# Patient Record
Sex: Female | Born: 1935 | Race: White | Hispanic: No | Marital: Married | Smoking: Never smoker
Health system: Southern US, Community
[De-identification: ages and names within clinical notes are randomized; demographics above are authoritative.]

## PROBLEM LIST (undated history)

## (undated) DIAGNOSIS — R112 Nausea with vomiting, unspecified: Secondary | ICD-10-CM

## (undated) DIAGNOSIS — T8859XA Other complications of anesthesia, initial encounter: Secondary | ICD-10-CM

## (undated) DIAGNOSIS — I509 Heart failure, unspecified: Secondary | ICD-10-CM

## (undated) DIAGNOSIS — F419 Anxiety disorder, unspecified: Secondary | ICD-10-CM

## (undated) DIAGNOSIS — K2210 Ulcer of esophagus without bleeding: Secondary | ICD-10-CM

## (undated) DIAGNOSIS — I4891 Unspecified atrial fibrillation: Secondary | ICD-10-CM

## (undated) DIAGNOSIS — Z923 Personal history of irradiation: Secondary | ICD-10-CM

## (undated) DIAGNOSIS — J449 Chronic obstructive pulmonary disease, unspecified: Secondary | ICD-10-CM

## (undated) DIAGNOSIS — T4145XA Adverse effect of unspecified anesthetic, initial encounter: Secondary | ICD-10-CM

## (undated) DIAGNOSIS — I10 Essential (primary) hypertension: Secondary | ICD-10-CM

## (undated) DIAGNOSIS — I499 Cardiac arrhythmia, unspecified: Secondary | ICD-10-CM

## (undated) DIAGNOSIS — H269 Unspecified cataract: Secondary | ICD-10-CM

## (undated) DIAGNOSIS — K219 Gastro-esophageal reflux disease without esophagitis: Secondary | ICD-10-CM

## (undated) DIAGNOSIS — C801 Malignant (primary) neoplasm, unspecified: Secondary | ICD-10-CM

## (undated) DIAGNOSIS — C50919 Malignant neoplasm of unspecified site of unspecified female breast: Secondary | ICD-10-CM

## (undated) DIAGNOSIS — Z9889 Other specified postprocedural states: Secondary | ICD-10-CM

## (undated) HISTORY — PX: BREAST SURGERY: SHX581

## (undated) HISTORY — DX: Unspecified atrial fibrillation: I48.91

## (undated) HISTORY — PX: COLONOSCOPY WITH ESOPHAGOGASTRODUODENOSCOPY (EGD): SHX5779

## (undated) HISTORY — DX: Chronic obstructive pulmonary disease, unspecified: J44.9

## (undated) HISTORY — PX: BREAST BIOPSY: SHX20

## (undated) HISTORY — DX: Heart failure, unspecified: I50.9

## (undated) HISTORY — PX: BREAST LUMPECTOMY: SHX2

## (undated) HISTORY — PX: HERNIA REPAIR: SHX51

---
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
## 2005-03-27 ENCOUNTER — Other Ambulatory Visit: Admission: RE | Admit: 2005-03-27 | Discharge: 2005-03-27 | Payer: Self-pay | Admitting: Obstetrics and Gynecology

## 2008-07-06 ENCOUNTER — Ambulatory Visit: Payer: Self-pay | Admitting: Surgery

## 2008-07-15 ENCOUNTER — Ambulatory Visit: Payer: Self-pay | Admitting: Surgery

## 2011-07-18 ENCOUNTER — Inpatient Hospital Stay: Payer: Self-pay | Admitting: Internal Medicine

## 2011-08-01 ENCOUNTER — Ambulatory Visit: Payer: Self-pay | Admitting: Orthopedic Surgery

## 2011-08-13 ENCOUNTER — Other Ambulatory Visit: Payer: Self-pay | Admitting: Neurosurgery

## 2011-08-13 ENCOUNTER — Encounter (HOSPITAL_COMMUNITY): Payer: Self-pay | Admitting: Respiratory Therapy

## 2011-08-19 ENCOUNTER — Encounter (HOSPITAL_COMMUNITY)
Admission: RE | Admit: 2011-08-19 | Discharge: 2011-08-19 | Disposition: A | Discharge status: Home / Self Care | Payer: MEDICARE | Source: Ambulatory Visit | Attending: Neurosurgery | Admitting: Neurosurgery

## 2011-08-19 ENCOUNTER — Encounter (HOSPITAL_COMMUNITY): Payer: Self-pay

## 2011-08-19 ENCOUNTER — Other Ambulatory Visit: Payer: Self-pay

## 2011-08-19 ENCOUNTER — Encounter (HOSPITAL_COMMUNITY)
Admission: RE | Admit: 2011-08-19 | Discharge: 2011-08-19 | Disposition: A | Discharge status: Home / Self Care | Payer: MEDICARE | Source: Ambulatory Visit | Attending: Anesthesiology | Admitting: Anesthesiology

## 2011-08-19 HISTORY — DX: Malignant (primary) neoplasm, unspecified: C80.1

## 2011-08-19 HISTORY — DX: Anxiety disorder, unspecified: F41.9

## 2011-08-19 HISTORY — DX: Essential (primary) hypertension: I10

## 2011-08-19 HISTORY — DX: Ulcer of esophagus without bleeding: K22.10

## 2011-08-19 LAB — BASIC METABOLIC PANEL
CO2: 28 mEq/L (ref 19–32)
Glucose, Bld: 92 mg/dL (ref 70–99)
Potassium: 4.2 mEq/L (ref 3.5–5.1)
Sodium: 138 mEq/L (ref 135–145)

## 2011-08-19 LAB — CBC
Hemoglobin: 12.8 g/dL (ref 12.0–15.0)
MCH: 26.7 pg (ref 26.0–34.0)
MCV: 81.5 fL (ref 78.0–100.0)
RBC: 4.8 MIL/uL (ref 3.87–5.11)
WBC: 7.2 10*3/uL (ref 4.0–10.5)

## 2011-08-19 LAB — SURGICAL PCR SCREEN: MRSA, PCR: POSITIVE — AB

## 2011-08-19 NOTE — Progress Notes (Signed)
Dr Darrold Junker called for any cardiac updates.pt lives in [place].

## 2011-08-19 NOTE — Pre-Procedure Instructions (Signed)
20 Cathy Spears  08/19/2011   Your procedure is scheduled on:  08/22/11  Report to [facility] at 1100 AM.  Call this number if you have problems the morning of surgery: [phone]   Remember:   Do not eat food:After Midnight.  May have clear liquids: up to 4 Hours before arrival.  Clear liquids include soda, tea, black coffee, apple or grape juice, broth.  Take these medicines the morning of surgery with A SIP OF WATER: neurontin,lopressor,protonix   Do not wear jewelry, make-up or nail polish.  Do not wear lotions, powders, or perfumes. You may wear deodorant.  Do not shave 48 hours prior to surgery.  Do not bring valuables to the hospital.  Contacts, dentures or bridgework may not be worn into surgery.  Leave suitcase in the car. After surgery it may be brought to your room.  For patients admitted to the hospital, checkout time is 11:00 AM the day of discharge.   Patients discharged the day of surgery will not be allowed to drive home.  Name and phone number of your driver: family  Special Instructions: CHG Shower Use Special Wash: 1/2 bottle night before surgery and 1/2 bottle morning of surgery.   Please read over the following fact sheets that you were given: Pain Booklet, MRSA Information and Surgical Site Infection Prevention

## 2011-08-21 MED ORDER — CEFAZOLIN SODIUM 1-5 GM-% IV SOLN
1.0000 g | INTRAVENOUS | Status: AC
  Administered 2011-08-22: 1 g via INTRAVENOUS
  Filled 2011-08-21: qty 50

## 2011-08-22 ENCOUNTER — Inpatient Hospital Stay (HOSPITAL_COMMUNITY)
Admission: RE | Admit: 2011-08-22 | Discharge: 2011-08-23 | DRG: 491 | Disposition: A | Discharge status: Home / Self Care | Payer: MEDICARE | Source: Ambulatory Visit | Attending: Neurosurgery | Admitting: Neurosurgery

## 2011-08-22 ENCOUNTER — Encounter (HOSPITAL_COMMUNITY): Payer: Self-pay | Admitting: *Deleted

## 2011-08-22 ENCOUNTER — Other Ambulatory Visit: Payer: Self-pay | Admitting: Neurosurgery

## 2011-08-22 ENCOUNTER — Ambulatory Visit (HOSPITAL_COMMUNITY): Payer: MEDICARE | Admitting: Anesthesiology

## 2011-08-22 ENCOUNTER — Encounter (HOSPITAL_COMMUNITY): Payer: Self-pay | Admitting: Anesthesiology

## 2011-08-22 ENCOUNTER — Ambulatory Visit (HOSPITAL_COMMUNITY): Payer: MEDICARE

## 2011-08-22 ENCOUNTER — Encounter (HOSPITAL_COMMUNITY)
Admission: RE | Disposition: A | Discharge status: Home / Self Care | Payer: Self-pay | Source: Ambulatory Visit | Attending: Neurosurgery

## 2011-08-22 DIAGNOSIS — Z7982 Long term (current) use of aspirin: Secondary | ICD-10-CM

## 2011-08-22 DIAGNOSIS — M7130 Other bursal cyst, unspecified site: Secondary | ICD-10-CM | POA: Diagnosis present

## 2011-08-22 DIAGNOSIS — K219 Gastro-esophageal reflux disease without esophagitis: Secondary | ICD-10-CM | POA: Diagnosis present

## 2011-08-22 DIAGNOSIS — Z853 Personal history of malignant neoplasm of breast: Secondary | ICD-10-CM

## 2011-08-22 DIAGNOSIS — F411 Generalized anxiety disorder: Secondary | ICD-10-CM | POA: Diagnosis present

## 2011-08-22 DIAGNOSIS — M48061 Spinal stenosis, lumbar region without neurogenic claudication: Principal | ICD-10-CM | POA: Diagnosis present

## 2011-08-22 DIAGNOSIS — M4310 Spondylolisthesis, site unspecified: Secondary | ICD-10-CM | POA: Diagnosis present

## 2011-08-22 DIAGNOSIS — M7138 Other bursal cyst, other site: Secondary | ICD-10-CM

## 2011-08-22 DIAGNOSIS — I10 Essential (primary) hypertension: Secondary | ICD-10-CM | POA: Diagnosis present

## 2011-08-22 HISTORY — PX: LUMBAR LAMINECTOMY/DECOMPRESSION MICRODISCECTOMY: SHX5026

## 2011-08-22 SURGERY — LUMBAR LAMINECTOMY/DECOMPRESSION MICRODISCECTOMY
Anesthesia: General | Site: Back | Laterality: Right | Wound class: Clean

## 2011-08-22 MED ORDER — SODIUM CHLORIDE 0.9 % IR SOLN
Status: DC | PRN
  Administered 2011-08-22: 15:00:00

## 2011-08-22 MED ORDER — GABAPENTIN 300 MG PO CAPS
300.0000 mg | ORAL_CAPSULE | Freq: Two times a day (BID) | ORAL | Status: DC
  Filled 2011-08-22 (×3): qty 1

## 2011-08-22 MED ORDER — NEOSTIGMINE METHYLSULFATE 1 MG/ML IJ SOLN
INTRAMUSCULAR | Status: DC | PRN
  Administered 2011-08-22: 4 mg via INTRAVENOUS

## 2011-08-22 MED ORDER — HYDROMORPHONE HCL PF 1 MG/ML IJ SOLN
INTRAMUSCULAR | Status: AC
  Administered 2011-08-22: 0.5 mg via INTRAVENOUS
  Filled 2011-08-22: qty 1

## 2011-08-22 MED ORDER — OXYCODONE-ACETAMINOPHEN 5-325 MG PO TABS
1.0000 | ORAL_TABLET | ORAL | Status: DC | PRN
  Administered 2011-08-23: 2 via ORAL
  Filled 2011-08-22: qty 2

## 2011-08-22 MED ORDER — KETOROLAC TROMETHAMINE 30 MG/ML IJ SOLN: 15.0000 mg | Freq: Once | INTRAMUSCULAR | Status: DC | PRN

## 2011-08-22 MED ORDER — BACITRACIN ZINC 500 UNIT/GM EX OINT
TOPICAL_OINTMENT | CUTANEOUS | Status: DC | PRN
  Administered 2011-08-22: 1 via TOPICAL

## 2011-08-22 MED ORDER — MEPERIDINE HCL 25 MG/ML IJ SOLN: 6.2500 mg | INTRAMUSCULAR | Status: DC | PRN

## 2011-08-22 MED ORDER — ACETAMINOPHEN 650 MG RE SUPP: 650.0000 mg | RECTAL | Status: DC | PRN

## 2011-08-22 MED ORDER — PANTOPRAZOLE SODIUM 40 MG PO TBEC
40.0000 mg | DELAYED_RELEASE_TABLET | Freq: Every day | ORAL | Status: DC
  Administered 2011-08-23: 40 mg via ORAL
  Filled 2011-08-22: qty 1

## 2011-08-22 MED ORDER — VITAMIN D3 25 MCG (1000 UNIT) PO TABS
1000.0000 [IU] | ORAL_TABLET | Freq: Every day | ORAL | Status: DC
  Filled 2011-08-22: qty 1

## 2011-08-22 MED ORDER — CEFAZOLIN SODIUM 1-5 GM-% IV SOLN
1.0000 g | Freq: Three times a day (TID) | INTRAVENOUS | Status: AC
  Administered 2011-08-22 – 2011-08-23 (×2): 1 g via INTRAVENOUS
  Filled 2011-08-22 (×2): qty 50

## 2011-08-22 MED ORDER — MUPIROCIN 2 % EX OINT
TOPICAL_OINTMENT | Freq: Two times a day (BID) | CUTANEOUS | Status: DC
  Administered 2011-08-22: 21:00:00 via NASAL
  Filled 2011-08-22: qty 22

## 2011-08-22 MED ORDER — 0.9 % SODIUM CHLORIDE (POUR BTL) OPTIME
TOPICAL | Status: DC | PRN
  Administered 2011-08-22: 1000 mL

## 2011-08-22 MED ORDER — LACTATED RINGERS IV SOLN
INTRAVENOUS | Status: DC | PRN
  Administered 2011-08-22 (×2): via INTRAVENOUS

## 2011-08-22 MED ORDER — DOCUSATE SODIUM 100 MG PO CAPS
100.0000 mg | ORAL_CAPSULE | Freq: Two times a day (BID) | ORAL | Status: DC
  Administered 2011-08-22: 100 mg via ORAL
  Filled 2011-08-22: qty 1

## 2011-08-22 MED ORDER — HYDROMORPHONE HCL PF 1 MG/ML IJ SOLN
0.2500 mg | INTRAMUSCULAR | Status: DC | PRN
  Administered 2011-08-22 (×2): 0.5 mg via INTRAVENOUS

## 2011-08-22 MED ORDER — MORPHINE SULFATE 4 MG/ML IJ SOLN: 1.0000 mg | INTRAMUSCULAR | Status: DC | PRN

## 2011-08-22 MED ORDER — PROPOFOL 10 MG/ML IV EMUL
INTRAVENOUS | Status: DC | PRN
  Administered 2011-08-22: 150 mg via INTRAVENOUS

## 2011-08-22 MED ORDER — HYDROCODONE-ACETAMINOPHEN 5-325 MG PO TABS: 1.0000 | ORAL_TABLET | ORAL | Status: DC | PRN

## 2011-08-22 MED ORDER — BUPIVACAINE-EPINEPHRINE PF 0.5-1:200000 % IJ SOLN
INTRAMUSCULAR | Status: DC | PRN
  Administered 2011-08-22: 10 mL

## 2011-08-22 MED ORDER — MENTHOL 3 MG MT LOZG: 1.0000 | LOZENGE | OROMUCOSAL | Status: DC | PRN

## 2011-08-22 MED ORDER — PROMETHAZINE HCL 25 MG/ML IJ SOLN: 6.2500 mg | INTRAMUSCULAR | Status: DC | PRN

## 2011-08-22 MED ORDER — ZOLPIDEM TARTRATE 5 MG PO TABS: 5.0000 mg | ORAL_TABLET | Freq: Every evening | ORAL | Status: DC | PRN

## 2011-08-22 MED ORDER — MIDAZOLAM HCL 5 MG/5ML IJ SOLN
INTRAMUSCULAR | Status: DC | PRN
  Administered 2011-08-22: 2 mg via INTRAVENOUS

## 2011-08-22 MED ORDER — ACETAMINOPHEN 325 MG PO TABS: 650.0000 mg | ORAL_TABLET | ORAL | Status: DC | PRN

## 2011-08-22 MED ORDER — GLYCOPYRROLATE 0.2 MG/ML IJ SOLN
INTRAMUSCULAR | Status: DC | PRN
  Administered 2011-08-22: .8 mg via INTRAVENOUS

## 2011-08-22 MED ORDER — BACITRACIN 50000 UNITS IM SOLR
INTRAMUSCULAR | Status: AC
  Filled 2011-08-22: qty 1

## 2011-08-22 MED ORDER — DIAZEPAM 5 MG PO TABS: 5.0000 mg | ORAL_TABLET | Freq: Four times a day (QID) | ORAL | Status: DC | PRN

## 2011-08-22 MED ORDER — HEMOSTATIC AGENTS (NO CHARGE) OPTIME
TOPICAL | Status: DC | PRN
  Administered 2011-08-22: 1 via TOPICAL

## 2011-08-22 MED ORDER — ONDANSETRON HCL 4 MG/2ML IJ SOLN
INTRAMUSCULAR | Status: DC | PRN
  Administered 2011-08-22: 4 mg via INTRAVENOUS

## 2011-08-22 MED ORDER — METOPROLOL TARTRATE 50 MG PO TABS
50.0000 mg | ORAL_TABLET | Freq: Two times a day (BID) | ORAL | Status: DC
  Administered 2011-08-22: 50 mg via ORAL
  Filled 2011-08-22 (×3): qty 1

## 2011-08-22 MED ORDER — ONDANSETRON HCL 4 MG/2ML IJ SOLN
4.0000 mg | INTRAMUSCULAR | Status: DC | PRN
  Administered 2011-08-22: 4 mg via INTRAVENOUS
  Filled 2011-08-22 (×2): qty 2

## 2011-08-22 MED ORDER — ACETAMINOPHEN 10 MG/ML IV SOLN
INTRAVENOUS | Status: DC | PRN
  Administered 2011-08-22: 1000 mg via INTRAVENOUS

## 2011-08-22 MED ORDER — LACTATED RINGERS IV SOLN: INTRAVENOUS | Status: DC

## 2011-08-22 MED ORDER — PHENOL 1.4 % MT LIQD: 1.0000 | OROMUCOSAL | Status: DC | PRN

## 2011-08-22 MED ORDER — ROCURONIUM BROMIDE 100 MG/10ML IV SOLN
INTRAVENOUS | Status: DC | PRN
  Administered 2011-08-22: 10 mg via INTRAVENOUS
  Administered 2011-08-22: 40 mg via INTRAVENOUS

## 2011-08-22 MED ORDER — FENTANYL CITRATE 0.05 MG/ML IJ SOLN
INTRAMUSCULAR | Status: DC | PRN
  Administered 2011-08-22: 100 ug via INTRAVENOUS
  Administered 2011-08-22 (×4): 25 ug via INTRAVENOUS

## 2011-08-22 MED ORDER — ANASTROZOLE 1 MG PO TABS
1.0000 mg | ORAL_TABLET | Freq: Every day | ORAL | Status: DC
  Filled 2011-08-22: qty 1

## 2011-08-22 MED ORDER — SODIUM CHLORIDE 0.9 % IV SOLN
INTRAVENOUS | Status: AC
  Filled 2011-08-22: qty 500

## 2011-08-22 MED ORDER — THROMBIN 5000 UNITS EX KIT
PACK | CUTANEOUS | Status: DC | PRN
  Administered 2011-08-22 (×2): 5000 [IU] via TOPICAL

## 2011-08-22 SURGICAL SUPPLY — 56 items
APL SKNCLS STERI-STRIP NONHPOA (GAUZE/BANDAGES/DRESSINGS) ×1
BAG DECANTER FOR FLEXI CONT (MISCELLANEOUS) ×2 IMPLANT
BENZOIN TINCTURE PRP APPL 2/3 (GAUZE/BANDAGES/DRESSINGS) ×2 IMPLANT
BLADE SURG ROTATE 9660 (MISCELLANEOUS) IMPLANT
BRUSH SCRUB EZ PLAIN DRY (MISCELLANEOUS) ×2 IMPLANT
BUR ACORN 6.0 (BURR) ×2 IMPLANT
BUR MATCHSTICK NEURO 3.0 LAGG (BURR) ×2 IMPLANT
CANISTER SUCTION 2500CC (MISCELLANEOUS) ×2 IMPLANT
CLOTH BEACON ORANGE TIMEOUT ST (SAFETY) ×2 IMPLANT
CONT SPEC 4OZ CLIKSEAL STRL BL (MISCELLANEOUS) ×3 IMPLANT
DRAPE LAPAROTOMY 100X72X124 (DRAPES) ×2 IMPLANT
DRAPE MICROSCOPE LEICA (MISCELLANEOUS) ×2 IMPLANT
DRAPE POUCH INSTRU U-SHP 10X18 (DRAPES) ×2 IMPLANT
DRAPE SURG 17X23 STRL (DRAPES) ×8 IMPLANT
ELECT BLADE 4.0 EZ CLEAN MEGAD (MISCELLANEOUS) ×2
ELECT REM PT RETURN 9FT ADLT (ELECTROSURGICAL) ×2
ELECTRODE BLDE 4.0 EZ CLN MEGD (MISCELLANEOUS) ×1 IMPLANT
ELECTRODE REM PT RTRN 9FT ADLT (ELECTROSURGICAL) ×1 IMPLANT
GAUZE SPONGE 4X4 12PLY STRL LF (GAUZE/BANDAGES/DRESSINGS) ×1 IMPLANT
GAUZE SPONGE 4X4 16PLY XRAY LF (GAUZE/BANDAGES/DRESSINGS) IMPLANT
GLOVE BIO SURGEON STRL SZ8.5 (GLOVE) ×2 IMPLANT
GLOVE BIOGEL PI IND STRL 6.5 (GLOVE) IMPLANT
GLOVE BIOGEL PI INDICATOR 6.5 (GLOVE) ×1
GLOVE ECLIPSE 7.5 STRL STRAW (GLOVE) ×2 IMPLANT
GLOVE EXAM NITRILE LRG STRL (GLOVE) IMPLANT
GLOVE EXAM NITRILE MD LF STRL (GLOVE) IMPLANT
GLOVE EXAM NITRILE XL STR (GLOVE) IMPLANT
GLOVE EXAM NITRILE XS STR PU (GLOVE) IMPLANT
GLOVE SS BIOGEL STRL SZ 8 (GLOVE) ×1 IMPLANT
GLOVE SUPERSENSE BIOGEL SZ 8 (GLOVE) ×1
GLOVE SURG SS PI 8.0 STRL IVOR (GLOVE) ×2 IMPLANT
GOWN BRE IMP SLV AUR LG STRL (GOWN DISPOSABLE) ×2 IMPLANT
GOWN BRE IMP SLV AUR XL STRL (GOWN DISPOSABLE) ×3 IMPLANT
GOWN STRL REIN 2XL LVL4 (GOWN DISPOSABLE) IMPLANT
KIT BASIN OR (CUSTOM PROCEDURE TRAY) ×2 IMPLANT
KIT ROOM TURNOVER OR (KITS) ×2 IMPLANT
NDL HYPO 21X1.5 SAFETY (NEEDLE) IMPLANT
NEEDLE HYPO 21X1.5 SAFETY (NEEDLE) IMPLANT
NEEDLE HYPO 22GX1.5 SAFETY (NEEDLE) ×3 IMPLANT
NS IRRIG 1000ML POUR BTL (IV SOLUTION) ×2 IMPLANT
PACK LAMINECTOMY NEURO (CUSTOM PROCEDURE TRAY) ×2 IMPLANT
PAD ARMBOARD 7.5X6 YLW CONV (MISCELLANEOUS) ×6 IMPLANT
PATTIES SURGICAL .5 X1 (DISPOSABLE) IMPLANT
RUBBERBAND STERILE (MISCELLANEOUS) ×4 IMPLANT
SPONGE GAUZE 4X4 12PLY (GAUZE/BANDAGES/DRESSINGS) ×1 IMPLANT
SPONGE SURGIFOAM ABS GEL SZ50 (HEMOSTASIS) ×2 IMPLANT
STRIP CLOSURE SKIN 1/2X4 (GAUZE/BANDAGES/DRESSINGS) ×2 IMPLANT
SUT VIC AB 1 CT1 18XBRD ANBCTR (SUTURE) ×2 IMPLANT
SUT VIC AB 1 CT1 8-18 (SUTURE) ×2
SUT VIC AB 2-0 CP2 18 (SUTURE) ×3 IMPLANT
SYR 20CC LL (SYRINGE) IMPLANT
SYR 20ML ECCENTRIC (SYRINGE) ×2 IMPLANT
TAPE HYPAFIX 4 X10 (GAUZE/BANDAGES/DRESSINGS) ×1 IMPLANT
TOWEL OR 17X24 6PK STRL BLUE (TOWEL DISPOSABLE) ×2 IMPLANT
TOWEL OR 17X26 10 PK STRL BLUE (TOWEL DISPOSABLE) ×2 IMPLANT
WATER STERILE IRR 1000ML POUR (IV SOLUTION) ×2 IMPLANT

## 2011-08-22 NOTE — Preoperative (Signed)
PT Took Metoprolol 50 mgs this am

## 2011-08-22 NOTE — Op Note (Signed)
Brief history: The patient is a 76 year old female who has suffered from back and right leg pain consistent with a lumbar radiculopathy. She has failed medical management and was worked up with a lumbar MRI. This demonstrated a large synovial cyst at L5-S1 on the right. I discussed the various treatment options including surgery. The patient has weighed the risks, benefits, and alternatives to surgery decided proceed with the operation.  Preoperative diagnosis: Right L5-S1 synovial cyst, spinal stenosis, lumbar radiculopathy, lumbago, facet arthropathy  Postoperative diagnosis: The same  Procedure: Right L5 hemilaminectomy for resection of synovial cyst decompressing the right L5 as well as S1 nerve root using microdissection.   Surgeon: Dr. Delma Officer  Asst.: Dr. Orbie Hurst  Anesthesia: Gen. endotracheal  Estimated blood loss: 50 cc  Drains: None  Complications: None  Description of procedure: The patient was brought to the operating room by the anesthesia team. General endotracheal anesthesia was induced. The patient was turned to the prone position on the Wilson frame. The patient's lumbosacral region was then prepared with Betadine scrub and Betadine solution. Sterile drapes were applied.  I then injected the area to be incised with Marcaine with epinephrine solution. I then used a scalpel to make a linear midline incision over the L5-S1 intervertebral disc space. I then used electrocautery to perform a right sided subperiosteal dissection exposing the spinous process and lamina of L4-L5 and S1. We obtained intraoperative radiograph to confirm our location. I then inserted the [facility] retractor for exposure.  We then brought the operative microscope into the field. Under its magnification and illumination we completed the microdissection. I used a high-speed drill to perform a laminotomy at L5. I then used a Kerrison punches to complete the L5 hemilaminectomyand removed the  ligamentum flavum at L4-5 and L5-S1. We then used microdissection to free up the thecal sac and the right L5 and S1 nerve root from the epidural tissue. We exposed what appeared to be a large right synovial cyst. I then used a Kerrison punch to perform a foraminotomy at about the right L5 and S1 nerve root. We removed the synovial cyst using the Kerrison punches. Given the history of breast cancer in this patient, We sent off specimens to the pathologist. Although as above it appeared to be a typical synovial cyst. After were satisfied with the decompression we used the coronary dilator to palpated along the ventral surface of thecal sac and along exit route of the right L5 and S1 nerve root. The neural structures were well decompressed..  We then obtained hemostasis using bipolar electrocautery. We irrigated the wound out with bacitracin solution. We then removed the retractor. We then reapproximated the patient's thoracolumbar fascia with interrupted #1 Vicryl suture. We then reapproximated the patient's subcutaneous tissue with interrupted 3-0 Vicryl suture. We then reapproximated patient's skin with Steri-Strips and benzoin. The was then coated with bacitracin ointment. The drapes were removed. The patient was subsequently returned to the supine position where they were extubated by the anesthesia team. The patient was then transported to the postanesthesia care unit in stable condition. All sponge instrument and needle counts were correct at the end of this case.

## 2011-08-22 NOTE — Anesthesia Procedure Notes (Signed)
Procedure Name: Intubation Date/Time: 08/22/2011 2:32 PM Performed by: Tyrone Nine Pre-anesthesia Checklist: Patient identified, Emergency Drugs available, Suction available and Patient being monitored Patient Re-evaluated:Patient Re-evaluated prior to inductionOxygen Delivery Method: Circle System Utilized Preoxygenation: Pre-oxygenation with 100% oxygen Intubation Type: IV induction Laryngoscope Size: Mac and 3 Grade View: Grade II Tube type: Oral Laser Tube: Cuffed inflated with minimal occlusive pressure - saline Tube size: 7.0 mm Number of attempts: 2 Airway Equipment and Method: stylet Placement Confirmation: ETT inserted through vocal cords under direct vision,  positive ETCO2 and CO2 detector Secured at: 22 cm Tube secured with: Tape Dental Injury: Teeth and Oropharynx as per pre-operative assessment

## 2011-08-22 NOTE — Anesthesia Preprocedure Evaluation (Addendum)
Anesthesia Evaluation  Patient identified by MRN, date of birth, ID band Patient awake    Reviewed: Allergy & Precautions, H&P , NPO status , Patient's Chart, lab work & pertinent test results  History of Anesthesia Complications Negative for: history of anesthetic complications  Airway Mallampati: I TM Distance: <3 FB Neck ROM: Full    Dental   Pulmonary neg pulmonary ROS,  clear to auscultation        Cardiovascular Exercise Tolerance: Good hypertension, Pt. on medications and Pt. on home beta blockers Regular Normal Hx of SVT   Neuro/Psych Anxiety Negative Neurological ROS     GI/Hepatic Neg liver ROS, GERD-  Medicated and Controlled,  Endo/Other    Renal/GU negative Renal ROS     Musculoskeletal   Abdominal   Peds  Hematology   Anesthesia Other Findings   Reproductive/Obstetrics                        Anesthesia Physical Anesthesia Plan  ASA: II  Anesthesia Plan: General   Post-op Pain Management:    Induction: Intravenous  Airway Management Planned: Oral ETT  Additional Equipment:   Intra-op Plan:   Post-operative Plan: Extubation in OR  Informed Consent:   Dental advisory given  Plan Discussed with: CRNA and Surgeon  Anesthesia Plan Comments:        Anesthesia Quick Evaluation

## 2011-08-22 NOTE — Progress Notes (Signed)
Subjective:  The patient is alert and pleasant. She looks well.  Objective: Vital signs in last 24 hours: Temp:  [97.6 F (36.4 C)] 97.6 F (36.4 C) (01/24 1126) Pulse Rate:  [97] 97  (01/24 1126) Resp:  [20] 20  (01/24 1126) BP: (159)/(77) 159/77 mmHg (01/24 1126) SpO2:  [98 %] 98 % (01/24 1126)  Intake/Output from previous day:   Intake/Output this shift: Total I/O In: 1600 [I.V.:1600] Out: 100 [Blood:100]  Physical exam the patient is alert and oriented. Her strength is normal her bilateral gastrocnemius and dorsiflexors.  Lab Results: No results found for this basename: WBC:2,HGB:2,HCT:2,PLT:2 in the last 72 hours BMET No results found for this basename: NA:2,K:2,CL:2,CO2:2,GLUCOSE:2,BUN:2,CREATININE:2,CALCIUM:2 in the last 72 hours  Studies/Results: Dg Lumbar Spine 2-3 Views  08/22/2011  *RADIOLOGY REPORT*  Clinical Data: Back pain  LUMBAR SPINE - 2-3 VIEW  Comparison: None.  Findings: Film #1 demonstrates an angled probe at L3-L4.  Film #2 demonstrates an angled probe at L5-S1.  IMPRESSION: As above  Original Report Authenticated By: Elsie Stain, M.D.    Assessment/Plan: The patient is doing well.  LOS: 0 days     Yamili Lichtenwalner D 08/22/2011, 5:10 PM

## 2011-08-22 NOTE — Anesthesia Postprocedure Evaluation (Signed)
  Anesthesia Post-op Note  Patient: Cathy Spears  Procedure(s) Performed:  LUMBAR LAMINECTOMY/DECOMPRESSION MICRODISCECTOMY - RIGHT Lumbar five sacral one  laminectomy and microdiscectomy  Patient Location: PACU  Anesthesia Type: General  Level of Consciousness: awake  Airway and Oxygen Therapy: Patient Spontanous Breathing  Post-op Pain: mild  Post-op Assessment: Post-op Vital signs reviewed  Post-op Vital Signs: stable  Complications: No apparent anesthesia complications

## 2011-08-22 NOTE — H&P (Signed)
Subjective: The patient is a 60 old female who complains of right leg pain. She has failed medical management. She was worked up with a lumbar MRI. This demonstrated patient has synovial cyst at L5-S1 on the right. I discussed the various treatment options with the patient and her family. Patient has weighed the risks, benefits, and alternatives surgery decided proceed with the right L5-S1 laminectomy for removal of synovial cyst.  Past Medical History  Diagnosis Date  . Esophageal erosions   . Hypertension     dr Darrold Junker  at [facility]  . Cancer     ,bil lymph nodes removed  breast  . Anxiety     Past Surgical History  Procedure Date  . Hernia repair   . Breast surgery     lumpectomy bil,pt. states lymph nodes were removed and  the left arm is restricted    No Known Allergies  History  Substance Use Topics  . Smoking status: Never Smoker   . Smokeless tobacco: Not on file  . Alcohol Use: No    History reviewed. No pertinent family history. Prior to Admission medications   Medication Sig Start Date End Date Taking? Authorizing Provider  anastrozole (ARIMIDEX) 1 MG tablet Take 1 mg by mouth daily.   Yes Historical Provider, MD  aspirin 81 MG tablet Take 81 mg by mouth daily.   Yes Historical Provider, MD  cholecalciferol (VITAMIN D) 1000 UNITS tablet Take 1,000 Units by mouth daily.   Yes Historical Provider, MD  gabapentin (NEURONTIN) 300 MG capsule Take 300 mg by mouth 2 (two) times daily.   Yes Historical Provider, MD  metoprolol (LOPRESSOR) 50 MG tablet Take 50 mg by mouth 2 (two) times daily.   Yes Historical Provider, MD  Multiple Vitamins-Minerals (PRESERVISION AREDS PO) Take 1 tablet by mouth 2 (two) times daily.   Yes Historical Provider, MD  pantoprazole (PROTONIX) 40 MG tablet Take 40 mg by mouth daily.   Yes Historical Provider, MD     Review of Systems  Positive ROS: Negative except as above  All other systems have been reviewed and were otherwise  negative with the exception of those mentioned in the HPI and as above.  Objective: Vital signs in last 24 hours: Temp:  [97.6 F (36.4 C)] 97.6 F (36.4 C) (01/24 1126) Pulse Rate:  [97] 97  (01/24 1126) Resp:  [20] 20  (01/24 1126) BP: (159)/(77) 159/77 mmHg (01/24 1126) SpO2:  [98 %] 98 % (01/24 1126)  General Appearance: Alert, cooperative, no distress, appears stated age Head: Normocephalic, without obvious abnormality, atraumatic Eyes: PERRL, conjunctiva/corneas clear, EOM's intact, fundi benign, both eyes      Ears: Normal TM's and external ear canals, both ears Throat: Lips, mucosa, and tongue normal; teeth and gums normal Neck: Supple, symmetrical, trachea midline, no adenopathy; thyroid: No enlargement/tenderness/nodules; no carotid bruit or JVD Back: Symmetric, no curvature, ROM normal, no CVA tenderness Lungs: Clear to auscultation bilaterally, respirations unlabored Heart: Regular rate and rhythm, S1 and S2 normal, no murmur, rub or gallop Abdomen: Soft, non-tender, bowel sounds active all four quadrants, no masses, no organomegaly Extremities: Extremities normal, atraumatic, no cyanosis or edema Pulses: 2+ and symmetric all extremities Skin: Skin color, texture, turgor normal, no rashes or lesions  NEUROLOGIC:   Mental status: alert and oriented, no aphasia, good attention span, Fund of knowledge/ memory ok Motor Exam - grossly normal Sensory Exam - grossly normal except she has decreased light-touch sensation in the right S1 distribution. Reflexes: Grossly  normal except she has an absent right gastrocnemius reflex Coordination - grossly normal Gait - grossly normal Balance - grossly normal Cranial Nerves: I: smell Not tested  II: visual acuity  OS: Normal    OD: Normal   II: visual fields Full to confrontation  II: pupils Equal, round, reactive to light  III,VII: ptosis None  III,IV,VI: extraocular muscles  Full ROM  V: mastication Normal  V: facial light  touch sensation  Normal  V,VII: corneal reflex  Present  VII: facial muscle function - upper  Normal  VII: facial muscle function - lower Normal  VIII: hearing Not tested  IX: soft palate elevation  Normal  IX,X: gag reflex Present  XI: trapezius strength  5/5  XI: sternocleidomastoid strength 5/5  XI: neck flexion strength  5/5  XII: tongue strength  Normal    Data Review Lab Results  Component Value Date   WBC 7.2 08/19/2011   HGB 12.8 08/19/2011   HCT 39.1 08/19/2011   MCV 81.5 08/19/2011   PLT 167 08/19/2011   Lab Results  Component Value Date   NA 138 08/19/2011   K 4.2 08/19/2011   CL 103 08/19/2011   CO2 28 08/19/2011   BUN 10 08/19/2011   CREATININE 0.60 08/19/2011   GLUCOSE 92 08/19/2011   No results found for this basename: INR, PROTIME   Imaging studies: I reviewed the patient's lumbar MRI performed at [facility] on 08/01/2011. The patient has a mild spondylolisthesis at L4-5 and L5-S1. She has a right-sided synovial cyst at L5-S1 on the right.  Assessment/Plan: Right L5-S1 synovial cyst, spinal stenosis, lumbar radiculopathy, lumbago: I discussed situation with the patient and her family. I reviewed the MR scan with them and pointed out the abnormalities. We have discussed the various treatment options including surgery. I described the surgical option of a right L5 hemilaminectomy for removal of the synovial cyst. I described the surgery to them we have discussed the risks, benefits, alternatives and likelihood of achieving our goals with surgery. I have answered all the patient and her family's questions. They want to proceed with surgery.   Cathy Spears D 08/22/2011 2:06 PM

## 2011-08-22 NOTE — Progress Notes (Signed)
06/2012 pt. Hospitalized for svt's at [facility]. Requested any studies from this hospital.

## 2011-08-22 NOTE — Transfer of Care (Signed)
Immediate Anesthesia Transfer of Care Note  Patient: Cathy Spears  Procedure(s) Performed:  LUMBAR LAMINECTOMY/DECOMPRESSION MICRODISCECTOMY - RIGHT Lumbar five sacral one  laminectomy and microdiscectomy  Patient Location: PACU  Anesthesia Type: General  Level of Consciousness: awake, alert , oriented and patient cooperative  Airway & Oxygen Therapy: Patient Spontanous Breathing and Patient connected to nasal cannula oxygen  Post-op Assessment: Report given to PACU RN, Post -op Vital signs reviewed and stable and Patient moving all extremities X 4  Post vital signs: Reviewed and stable  Complications: No apparent anesthesia complications

## 2011-08-23 ENCOUNTER — Encounter: Payer: Self-pay | Admitting: *Deleted

## 2011-08-23 MED ORDER — OXYCODONE-ACETAMINOPHEN 10-325 MG PO TABS: 1.0000 | ORAL_TABLET | ORAL | Status: AC | PRN

## 2011-08-23 MED ORDER — DIAZEPAM 5 MG PO TABS: 5.0000 mg | ORAL_TABLET | Freq: Four times a day (QID) | ORAL | Status: AC | PRN

## 2011-08-23 MED ORDER — DSS 100 MG PO CAPS: 100.0000 mg | ORAL_CAPSULE | Freq: Two times a day (BID) | ORAL | Status: AC

## 2011-08-23 NOTE — Evaluation (Signed)
Occupational Therapy Evaluation Patient Details Name: Cathy Spears MRN: [id number] DOB: [birth year] Today's Date: 08/23/2011  Problem List: There is no problem list on file for this patient.   Past Medical History:  Past Medical History  Diagnosis Date  . Esophageal erosions   . Hypertension     dr Darrold Junker  at [facility]  . Cancer     ,bil lymph nodes removed  breast  . Anxiety    Past Surgical History:  Past Surgical History  Procedure Date  . Hernia repair   . Breast surgery     lumpectomy bil,pt. states lymph nodes were removed and  the left arm is restricted    OT Assessment/Plan/Recommendation OT Assessment Clinical Impression Statement: Pt presents to OT with decreased I with ADL activity- but will have husband and daugther to assist post DC per pt and husband OT Recommendation/Assessment: Patient does not need any further OT services OT Recommendation Follow Up Recommendations: No OT follow up;Other (comment) (pt stated family would assist and she did not neeed to be II) Equipment Recommended: None recommended by OT     OT Evaluation Precautions/Restrictions  Precautions Precautions: Back Required Braces or Orthoses: No Restrictions Weight Bearing Restrictions: No Prior Functioning Home Living Lives With: Spouse Receives Help From: Family Type of Home: House Home Layout: Two level Alternate Level Stairs-Rails: Can reach both Alternate Level Stairs-Number of Steps: 15 steps Home Access: Stairs to enter Entrance Stairs-Rails: Can reach both Bathroom Shower/Tub: Engineer, manufacturing systems: Standard Home Adaptive Equipment: None Prior Function Level of Independence: Independent with basic ADLs Driving: No ADL ADL Eating/Feeding: Performed;Supervision/safety Grooming: Performed;Wash/dry hands Where Assessed - Grooming: Standing at sink Where Assessed - Upper Body Bathing: Sit to stand from bed Lower Body Bathing: Maximal  assistance Lower Body Bathing Details (indicate cue type and reason): husband and daughter will provide assist Where Assessed - Lower Body Bathing: Sit to stand from bed Upper Body Dressing: Simulated;Minimal assistance Where Assessed - Upper Body Dressing: Sitting, bed;Unsupported Lower Body Dressing: Maximal assistance Lower Body Dressing Details (indicate cue type and reason): husband and daugther will assist Where Assessed - Lower Body Dressing: Sit to stand from bed;Sitting, bed Toilet Transfer: Supervision/safety Toilet Transfer Equipment: Comfort height toilet Toileting - Clothing Manipulation: Performed;Supervision/safety Toileting - Hygiene: Psychologist, counselling - Hygiene Details (indicate cue type and reason): educated in back precautions with hygiene Where Assessed - Toileting Hygiene: Standing Tub/Shower Transfer: Not assessed Ambulation Related to ADLs: supervision ADL Comments: husband and daugther will assist Vision/Perception  Vision - History Baseline Vision: No visual deficits Cognition Cognition Arousal/Alertness: Awake/alert Overall Cognitive Status: Appears within functional limits for tasks assessed Orientation Level: Oriented X4 Extremity Assessment RUE Assessment RUE Assessment: Within Functional Limits LUE Assessment LUE Assessment: Within Functional Limits    End of Session OT - End of Session Equipment Utilized During Treatment: Gait belt Activity Tolerance: Patient tolerated treatment well Patient left: in bed General Behavior During Session: [facility] for tasks performed Cognition: [facility] for tasks performed   Tao Satz, Metro Kung 08/23/2011, 10:27 AM

## 2011-08-23 NOTE — Discharge Summary (Signed)
Physician Discharge Summary  Patient ID: NHI BUTRUM MRN: [id number] DOB/AGE: 1936/04/22 76 y.o.  Admit date: 08/22/2011 Discharge date: 08/23/2011  Admission Diagnoses:lumbar synovial cyst  Discharge Diagnoses: same Active Problems:  * No active hospital problems. *    Discharged Condition: good  Hospital Course: I admitted the patient to [facility] on 08/22/2011. On that day I performed a laminectomy for removal synovial cyst. Surgery went well. The patient's postoperative course was unremarkable and she was requesting discharge to home on postop day #1.  Consults:none Significant Diagnostic Studies:none Treatments:L5 hemilaminectomy for decompression of the right L5 and S1 nerve roots using microdissection. Discharge Exam: Blood pressure 138/70, pulse 63, temperature 98 F (36.7 C), temperature source Oral, resp. rate 16, SpO2 93.00%. The patient is alert and oriented. Her strength is normal in her lower extremities. Her dressing is clean and dry. She looks well.  Disposition: home  Discharge Orders    Future Orders Please Complete By Expires   Diet - low sodium heart healthy      Increase activity slowly      Discharge instructions      Comments:   The patient was given oral and written discharge instructions. All her questions were answered. She was instructed to call [phone] for a followup appointment .   Remove dressing in 48 hours      Call MD for:  temperature >100.4      Call MD for:  persistant nausea and vomiting      Call MD for:  severe uncontrolled pain      Call MD for:  redness, tenderness, or signs of infection (pain, swelling, redness, odor or green/yellow discharge around incision site)      Call MD for:  difficulty breathing, headache or visual disturbances      Call MD for:  hives      Call MD for:  persistant dizziness or light-headedness      Call MD for:  extreme fatigue        Medication List  As of 08/23/2011  7:45 AM   TAKE  these medications         anastrozole 1 MG tablet   Commonly known as: ARIMIDEX   Take 1 mg by mouth daily.      aspirin 81 MG tablet   Take 81 mg by mouth daily.      cholecalciferol 1000 UNITS tablet   Commonly known as: VITAMIN D   Take 1,000 Units by mouth daily.      diazepam 5 MG tablet   Commonly known as: VALIUM   Take 1 tablet (5 mg total) by mouth every 6 (six) hours as needed.      DSS 100 MG Caps   Take 100 mg by mouth 2 (two) times daily.      gabapentin 300 MG capsule   Commonly known as: NEURONTIN   Take 300 mg by mouth 2 (two) times daily.      metoprolol 50 MG tablet   Commonly known as: LOPRESSOR   Take 50 mg by mouth 2 (two) times daily.      oxyCODONE-acetaminophen 10-325 MG per tablet   Commonly known as: PERCOCET   Take 1 tablet by mouth every 4 (four) hours as needed for pain.      pantoprazole 40 MG tablet   Commonly known as: PROTONIX   Take 40 mg by mouth daily.      PRESERVISION AREDS PO   Take 1 tablet by mouth  2 (two) times daily.             SignedCristi Loron 08/23/2011, 7:45 AM

## 2011-08-23 NOTE — Progress Notes (Signed)
Physical Therapy Evaluation Patient Details Name: Cathy Spears MRN: [id number] DOB: Dec 13, 1935 Today's Date: 08/23/2011  Problem List: There is no problem list on file for this patient.   Past Medical History:  Past Medical History  Diagnosis Date  . Esophageal erosions   . Hypertension     dr Darrold Junker  at [facility]  . Cancer     ,bil lymph nodes removed  breast  . Anxiety    Past Surgical History:  Past Surgical History  Procedure Date  . Hernia repair   . Breast surgery     lumpectomy bil,pt. states lymph nodes were removed and  the left arm is restricted    PT Assessment/Plan/Recommendation PT Assessment Clinical Impression Statement: Pt presents with a medical diagnosis of L5-S1 microdiscectomy. Pt is at a supervision/mod I level for all mobility. Pt has been educated on back precautions as well as given a handout for positions to avoid and compensations to maintain back precautions. Pt will not be followed acutely by PT PT Recommendation/Assessment: Patent does not need any further PT services No Skilled PT: All education completed;Patient will have necessary level of assist by caregiver at discharge;Patient is supervision for all activity/mobility PT Recommendation Follow Up Recommendations: No PT follow up;Supervision - Intermittent Equipment Recommended: None recommended by OT PT Goals     PT Evaluation Precautions/Restrictions  Precautions Precautions: Back Required Braces or Orthoses: No Prior Functioning  Home Living Lives With: Spouse Receives Help From: Family Type of Home: House Home Layout: Two level Alternate Level Stairs-Rails: Can reach both Alternate Level Stairs-Number of Steps: 15 steps Home Access: Stairs to enter Entrance Stairs-Rails: Can reach both Bathroom Shower/Tub: Network engineer: None Prior Function Level of Independence: Independent with basic ADLs Able to Take  Stairs?: Yes Driving: No Vocation: Retired Producer, television/film/video: Awake/alert Overall Cognitive Status: Appears within functional limits for tasks assessed Orientation Level: Oriented X4 Sensation/Coordination Sensation Light Touch: Appears Intact Extremity Assessment RUE Assessment RUE Assessment: Within Functional Limits LUE Assessment LUE Assessment: Within Functional Limits RLE Assessment RLE Assessment: Within Functional Limits LLE Assessment LLE Assessment: Within Functional Limits Mobility (including Balance) Bed Mobility Bed Mobility: Yes Rolling Right: 5: Supervision Rolling Right Details (indicate cue type and reason): VC for hand placement and maintaining back precautions while turning Right Sidelying to Sit: 5: Supervision Right Sidelying to Sit Details (indicate cue type and reason): VC for sequencing while maintaining back precautions Sitting - Scoot to Edge of Bed: 6: Modified independent (Device/Increase time) Sit to Supine: 5: Supervision Sit to Supine - Details (indicate cue type and reason): VC for sequencing in order to maintain back precautions Transfers Transfers: Yes Sit to Stand: With upper extremity assist;From bed;6: Modified independent (Device/Increase time) Stand to Sit: 6: Modified independent (Device/Increase time);With upper extremity assist;To bed Ambulation/Gait Ambulation/Gait: Yes Ambulation/Gait Assistance: 5: Supervision Ambulation/Gait Assistance Details (indicate cue type and reason): Supervision for safety Ambulation Distance (Feet): 30 Feet Assistive device: None Gait Pattern: Within Functional Limits Gait velocity: Decreased gait speed Stairs: No (pt declined as they are d/c'ing shortly)    Exercise    End of Session PT - End of Session Equipment Utilized During Treatment: Gait belt Activity Tolerance: Patient tolerated treatment well Patient left: in bed;with call bell in reach;with family/visitor  present Nurse Communication: Mobility status for transfers;Mobility status for ambulation General Behavior During Session: [facility] for tasks performed Cognition: [facility] for tasks performed  Milana Kidney 08/23/2011, 10:48 AM  08/23/2011 Wandra Arthurs  Larita Fife DPT PAGER: [phone] OFFICE: [phone]

## 2013-06-17 ENCOUNTER — Ambulatory Visit: Payer: Self-pay | Admitting: Gastroenterology

## 2014-11-20 NOTE — Discharge Summary (Signed)
PATIENT NAME:  Cathy Spears, Cathy Spears MR#:  [id number] DATE OF BIRTH:  03/15/1936  DATE OF ADMISSION:  07/18/2011 DATE OF DISCHARGE:  07/20/2011  DISCHARGE DIAGNOSES:  1. Supraventricular tachycardia, now resolved, now in sinus rhythm.  2. Hypokalemia.  3. Hypomagnesemia.  4. Hyponatremia secondary to hydrochlorothiazide, resolved. 5. Elevated troponin secondary to tachycardia. 6. Urinary tract infection. 7. Dysphagia. 8. Hypertension. 9. History of breast cancer. 10. Mild esophagitis.   CONSULTS: 1. Cardiology, Dr. Saralyn Pilar. 2. GI, Dr. Candace Cruise. 3. Speech and swallow therapy.   HOSPITAL COURSE: 79 year old female, she lives in [place], she is visiting her daughter here. She has history of hypertension, osteoporosis, history of breast cancer status post bilateral mastectomies. She presented with some shortness of breath. When she presented she was found to be in supraventricular tachycardia. Her heart rate was in 200 when she presented. 20 mg of IV diltiazem was given to the patient, she converted into normal sinus rhythm and she was started on metoprolol which was increased to 50 mg b.i.d. At admission she was found to have multiple electrolyte abnormalities. She was hypokalemic with a potassium of 3.1, sodium 129, hyponatremic, her magnesium was low at 1.6. She was on hydrochlorothiazide, that was stopped. She was given IV hydration. Her TSH was normal at 1.51. Her initial troponin was negative. Repeat troponin was slightly elevated at 0.12 to 0.16 most likely secondary to tachycardia. Cardiology saw her during the hospital stay, Dr. Saralyn Pilar. He suggested to discontinue hydrochlorothiazide. Replete the electrolytes and give metoprolol for supraventricular tachycardia. He discontinued the Myoview. Patient does not have acute coronary syndrome or coronary artery disease. He recommended a functional study later on as outpatient. This can also be done in [place]. An echocardiogram was done and echo  actually showed normal LV function, ejection fraction of greater than 55%, left atrium normal, right ventricular pressure mildly elevated at 30 to 40 mmHg. When she came in she had a chest x-ray done PA and lateral that was essentially negative. Heart size was normal. She remained in sinus rhythm on the monitor. Her electrolytes have improved. Her magnesium today is 2, her potassium is 3.6 and her sodium has improved to 135. When she came in her d-dimer was negative at 0.37. Her urinalysis showed some 3+ leukocyte esterase and 61 WBCs and she was on ciprofloxacin, I will complete a five day course of ciprofloxacin on her. She was complaining of some dysphagia and choking spells and chronic cough, which could be related to gastroesophageal reflux disease. GI was consulted. 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 Patient has been started on PPI. Patient is tolerating diet. I will also stop her alendronate because of her esophagitis and that can be restarted as outpatient. Will also start her on low dose aspirin because of her supraventricular tachycardia. I had a detailed discussion with the family member at the time of discharge, explained to them about SVT, acid reflux. I will give her some Tessalon Perles for her dry cough. This could be related to gastroesophageal reflux disease. If her cough improves with PPI that means it is related to gastroesophageal reflux disease.   HOME MEDICATIONS:  1. PreserVision 1 tab twice a day.  2. Vitamin D3 once a day. 3. Anastrozole 1 mg daily.   NEW MEDICATIONS:  1. Metoprolol 50 mg p.o. b.i.d.  2. Protonix 40 mg p.o. once daily.  3. Ecotrin 81 mg p.o. once a day. 4. Tessalon Perles 100 mg q.6 hours as needed for cough. 5. Ciprofloxacin 250 mg p.o. b.i.d. for three days.   NOTE: Do not take hydrochlorothiazide. Do not  take alendronate.   ACTIVITY: As tolerated.   CONDITION AT DISCHARGE: Comfortable. T-max 98.1, heart rate 71 blood pressure 113/53, she is saturating 96% on room air. She remained 90% to 95% on ambulation.   FOLLOW UP: Patient should follow up with Dr. Candace Cruise, [facility] GI, in 1 to 2 weeks. Follow up with Dr. Jacqualine Code in 1 to 2 weeks. Follow up BMP at Dr. Ebbie Ridge office. Follow up with Dr. Saralyn Pilar, [facility] cardiology, in 1 to 2 weeks. The daughter was suggesting that they want to have Myoview before they go to [place], they can have it done at [facility] cardiology by Dr. Saralyn Pilar. If there is any worsening shortness of breath or any chest pain she can come back to the ER. I also advised her that she should keep her head elevated when she sleeps at night to prevent acid reflux symptoms.   TIME SPENT WITH DISCHARGE: 60 minutes.   ____________________________ Mena Pauls, MD ag:cms D: 07/20/2011 12:32:42 ET T: 07/22/2011 08:37:58 ET JOB#: [id number]  cc: Mena Pauls, MD, <Dictator> Liberty Jacqualine Code, MD Lupita Dawn. Candace Cruise, MD Isaias Cowman, MD Mena Pauls MD ELECTRONICALLY SIGNED 08/16/2011 11:49

## 2014-11-20 NOTE — Consult Note (Signed)
PATIENT NAME:  Cathy Spears, Cathy Spears MR#:  [id number] DATE OF BIRTH:  Jul 27, 1936  DATE OF CONSULTATION:  07/19/2011  REFERRING PHYSICIAN:   CONSULTING PHYSICIAN:  Isaias Cowman, MD  PRIMARY CARE PHYSICIAN: Dr. Jacqualine Code   CHIEF COMPLAINT: Shortness of breath.   HISTORY OF PRESENT ILLNESS: The patient is a 79 year old female referred for evaluation of supraventricular tachycardia. The patient has a history of palpitations and arrhythmia in the past. She has also had episodes where she felt shortness of breath. During the past few days these episodes have become more prominent associated with dyspnea, typically lasting seconds to minutes. One day prior to admission, the patient had an episode which made her markedly short of breath. EMS was called and upon arrival the patient was asymptomatic, had returned to baseline. The next day the patient went to the [facility] and was sent to [facility] Emergency Room. In the Emergency Room, the patient had an episode of supraventricular tachycardia at 208 bpm. Patient was treated with diltiazem bolus and converted to sinus rhythm. She had borderline elevated troponin of 0.16. Other notable admission labs included a sodium of 129 and a potassium of 3.1. The patient currently has been taking hydrochlorothiazide during the past year. Serum magnesium also came back at 1.6.   PAST MEDICAL HISTORY:  1. Hypertension.  2. Breast cancer status post mastectomy and radiation as well as lymph node resection.  3. Osteoporosis.   MEDICATIONS:  1. HCTZ 25 mg daily.  2. Vitamin D 3000 international units daily.  3. PreserVision  daily.  4. Anastrozole 1 mg daily.  5. Alendronate 70 mg weekly.   SOCIAL HISTORY: The patient is married. She has three children. She currently resides with her husband in [place], [place]. She was here visiting her daughter.   FAMILY HISTORY: No immediate family history of coronary artery disease or  myocardial infarction.   REVIEW OF SYSTEMS: CONSTITUTIONAL: No fever or chills. EYES: No blurry vision. EARS: No hearing loss. RESPIRATORY: Patient does have episodic shortness of breath as described above. CARDIOVASCULAR: Patient denies chest pain. GASTROINTESTINAL: Patient denies nausea, vomiting, diarrhea, constipation. GENITOURINARY: Patient denies dysuria, hematuria. ENDOCRINE: Patient denies polyuria, polydipsia. MUSCULOSKELETAL: Patient denies arthralgias, myalgias. NEUROLOGICAL: Patient denies focal muscle weakness or numbness. PSYCHOLOGICAL: Patient denies anxiety or depression.   PHYSICAL EXAMINATION:  VITAL SIGNS: Blood pressure 133/74, pulse 71, respirations 20, temperature 98.4, pulse oximetry 93%.   HEENT: Pupils equal, reactive to light and accommodation.   NECK: Supple without thyromegaly.   LUNGS: Clear.   CARDIOVASCULAR: Normal jugular venous pressure. Normal point of maximal impulse. Regular rate, rhythm. Normal S1, S2. No appreciable gallop, murmur, rub.   ABDOMEN: Soft and nontender. Pulses were intact bilaterally.   MUSCULOSKELETAL: Normal muscle tone.   NEUROLOGIC: Patient was alert and oriented x3. Motor and sensory are both grossly intact.   IMPRESSION: 79 year old female who presents with episodic shortness of breath with documented supraventricular tachycardia while in the Emergency Room which converted to sinus rhythm after Cardizem bolus. Patient was also hypokalemic and hypomagnesemic likely due to HCTZ taken for hypertension.   RECOMMENDATIONS:  1. Discontinue HCTZ.  2. Replete potassium and magnesium.  3. Agree with metoprolol for hypertension control as well as for supraventricular tachycardia. Up titrate to 50 mg b.i.d.  4. Review 2-D echocardiogram.  5. Discontinue ETT Myoview. I think is unlikely the patient has acute coronary syndrome or coronary artery disease. Would prefer functional study at a later date when patient's electrolytes are completely  normalized and patient is stable. This can be performed when she returns back to [place].  6. Further recommendations pending echocardiogram results.   ____________________________ Isaias Cowman, MD ap:cms D: 07/19/2011 08:12:58 ET T: 07/19/2011 10:55:23 ET JOB#: [id number]  cc: Isaias Cowman, MD, <Dictator> Isaias Cowman MD ELECTRONICALLY SIGNED 08/10/2011 10:03

## 2017-04-02 ENCOUNTER — Other Ambulatory Visit: Payer: Self-pay | Admitting: Hematology and Oncology

## 2017-04-02 DIAGNOSIS — C50511 Malignant neoplasm of lower-outer quadrant of right female breast: Secondary | ICD-10-CM

## 2017-04-02 DIAGNOSIS — C50512 Malignant neoplasm of lower-outer quadrant of left female breast: Principal | ICD-10-CM

## 2017-05-22 ENCOUNTER — Ambulatory Visit
Admission: RE | Admit: 2017-05-22 | Discharge: 2017-05-22 | Disposition: A | Discharge status: Home / Self Care | Payer: Medicare Other | Source: Ambulatory Visit | Attending: Hematology and Oncology | Admitting: Hematology and Oncology

## 2017-05-22 DIAGNOSIS — C50512 Malignant neoplasm of lower-outer quadrant of left female breast: Principal | ICD-10-CM

## 2017-05-22 DIAGNOSIS — C50511 Malignant neoplasm of lower-outer quadrant of right female breast: Secondary | ICD-10-CM

## 2017-05-22 MED ORDER — GADOBENATE DIMEGLUMINE 529 MG/ML IV SOLN
17.0000 mL | Freq: Once | INTRAVENOUS | Status: AC | PRN
  Administered 2017-05-22: 17 mL via INTRAVENOUS

## 2017-06-05 ENCOUNTER — Other Ambulatory Visit: Payer: Self-pay | Admitting: Hematology and Oncology

## 2017-06-05 DIAGNOSIS — R9389 Abnormal findings on diagnostic imaging of other specified body structures: Secondary | ICD-10-CM

## 2017-06-05 DIAGNOSIS — N63 Unspecified lump in unspecified breast: Secondary | ICD-10-CM

## 2017-06-10 ENCOUNTER — Ambulatory Visit
Admission: RE | Admit: 2017-06-10 | Discharge: 2017-06-10 | Disposition: A | Discharge status: Home / Self Care | Payer: Medicare Other | Source: Ambulatory Visit | Attending: Hematology and Oncology | Admitting: Hematology and Oncology

## 2017-06-10 ENCOUNTER — Other Ambulatory Visit: Payer: Self-pay | Admitting: Hematology and Oncology

## 2017-06-10 DIAGNOSIS — R9389 Abnormal findings on diagnostic imaging of other specified body structures: Secondary | ICD-10-CM

## 2017-06-10 DIAGNOSIS — N63 Unspecified lump in unspecified breast: Secondary | ICD-10-CM

## 2017-06-10 DIAGNOSIS — R928 Other abnormal and inconclusive findings on diagnostic imaging of breast: Secondary | ICD-10-CM

## 2017-06-17 ENCOUNTER — Ambulatory Visit
Admission: RE | Admit: 2017-06-17 | Discharge: 2017-06-17 | Disposition: A | Discharge status: Home / Self Care | Payer: Medicare Other | Source: Ambulatory Visit | Attending: Hematology and Oncology | Admitting: Hematology and Oncology

## 2017-06-17 DIAGNOSIS — R928 Other abnormal and inconclusive findings on diagnostic imaging of breast: Secondary | ICD-10-CM

## 2017-06-17 MED ORDER — GADOBENATE DIMEGLUMINE 529 MG/ML IV SOLN
17.0000 mL | Freq: Once | INTRAVENOUS | Status: AC | PRN
  Administered 2017-06-17: 17 mL via INTRAVENOUS

## 2017-08-08 ENCOUNTER — Other Ambulatory Visit: Payer: Self-pay | Admitting: Internal Medicine

## 2017-08-08 DIAGNOSIS — R1312 Dysphagia, oropharyngeal phase: Secondary | ICD-10-CM

## 2017-09-03 ENCOUNTER — Ambulatory Visit
Admission: RE | Admit: 2017-09-03 | Discharge: 2017-09-03 | Disposition: A | Discharge status: Home / Self Care | Payer: Medicare HMO | Source: Ambulatory Visit | Attending: Internal Medicine | Admitting: Internal Medicine

## 2017-09-03 DIAGNOSIS — R1310 Dysphagia, unspecified: Secondary | ICD-10-CM | POA: Diagnosis not present

## 2017-09-03 DIAGNOSIS — R1312 Dysphagia, oropharyngeal phase: Secondary | ICD-10-CM

## 2017-09-03 NOTE — Therapy (Addendum)
[facility] [place], [place], [address] Phone: [phone]   Fax:     Modified Barium Swallow  Patient Details  Name: Cathy Spears MRN: [id number] Date of Birth: 1936-01-01 No Data Recorded  Encounter Date: 09/03/2017  End of Session - 09/03/17 1647    Visit Number  1    Number of Visits  1    Date for SLP Re-Evaluation  09/03/17    SLP Start Time  1255    SLP Stop Time   1355    SLP Time Calculation (min)  60 min    Activity Tolerance  Patient tolerated treatment well       Past Medical History:  Diagnosis Date  . Anxiety   . Cancer    ,bil lymph nodes removed  breast  . Esophageal erosions   . Hypertension    dr Saralyn Pilar  at [facility] clinic    Past Surgical History:  Procedure Laterality Date  . BREAST SURGERY     lumpectomy bil,pt. states lymph nodes were removed and  the left arm is restricted  . HERNIA REPAIR    . LUMBAR LAMINECTOMY/DECOMPRESSION MICRODISCECTOMY  08/22/2011   Procedure: LUMBAR LAMINECTOMY/DECOMPRESSION MICRODISCECTOMY;  Surgeon: Ophelia Charter, MD;  Location: [facility];  Service: Neurosurgery;  Laterality: Right;  RIGHT Lumbar five sacral one  laminectomy and microdiscectomy    There were no vitals filed for this visit.      Subjective: Patient behavior: (alertness, ability to follow instructions, etc.): pt alert, verbally conversive and engaged easily w/ SLP. She followed instruction and answered questions easily. Daughter present.  Chief complaint: dysphagia. Baseline GERD dx; Esophageal Stenosis in 2012; Hernia repair surgery 2009. Pt will f/u w/ GI later this month per pt/Daughter report. On a PPI for several years. Per PCP, pt is having recurrent symptoms of solid food dysphagia and episodic choking - "needing esophageal dilation". Pt reported she also awakened during the night "choking" but this was not consistent. Pt does have sinus issues per MD note. No  Pulmonary issues; recent CXR "normal" per report. Native dentition, missing few.   Objective:  Radiological Procedure: A videoflouroscopic evaluation of oral-preparatory, reflex initiation, and pharyngeal phases of the swallow was performed; as well as a screening of the upper esophageal phase.  I. POSTURE: upright  II. VIEW: lateral III. COMPENSATORY STRATEGIES: None indicated IV. BOLUSES ADMINISTERED:  Thin Liquid: 6 trials  Nectar-thick Liquid: 2 trials  Honey-thick Liquid: NT  Puree: 3 trials  Mechanical Soft: 1 trial V. RESULTS OF EVALUATION: A. ORAL PREPARATORY PHASE: (The lips, tongue, and velum are observed for strength and coordination)       **Overall Severity Rating: [facility] for trials given. Pt exhibited adequate bolus management and control for A-P transfer; timely A-P transfer. No oral residue remained post swallow.  B. SWALLOW INITIATION/REFLEX: (The reflex is normal if "triggered" by the time the bolus reached the base of the tongue)  **Overall Severity Rating: [facility]. Timely pharyngeal swallow initiation w/ all trial consistencies. Appropriate airway closure/protection during the swallow. No laryngeal penetration/aspiration occurred.   C. PHARYNGEAL PHASE: (Pharyngeal function is normal if the bolus shows rapid, smooth, and continuous transit through the pharynx and there is no pharyngeal residue after the swallow)  **Overall Severity Rating: [facility]. Pharyngeal clearing post swallowing indicating adequate laryngeal excursion and pharyngeal pressure during the swallow. No pharyngeal residue.  D. LARYNGEAL PENETRATION: (Material entering into the laryngeal inlet/vestibule but not aspirated): NONE  E.  ASPIRATION: NONE F. ESOPHAGEAL PHASE: (Screening of the upper esophagus): appeared [facility] w/in the cervical Esophagus viewable   ASSESSMENT: Pt appears to present w/ adequate oropharyngeal phase swallowing function w/ reduced risk for aspiration from an oropharyngeal phase standpoint. Pt  does have significant h/o, and reported s/s of, GERD w/ Regurgitation which could increase risk for aspiration of Reflux material thus Pulmonary decline from such. During this exam w/ trials given, the oral phase appeared grossly wfl. Pt exhibited adequate bolus management and control for A-P transfer; timely A-P transfer. No oral residue remained post swallow. During the pharyngeal phase, pt's pharyngeal swallow initiation was timely w/ all trial consistencies. Appropriate airway closure/protection during the swallow noted. No laryngeal penetration/aspiration occurred w/ trials. Pharyngeal clearing post swallowing was appropriate indicating adequate laryngeal excursion and pharyngeal pressure during the swallow. No pharyngeal residue remained post swallow. During the Esophageal phase, no immediate bolus dysmotility was noted w/in the viewable Cervical Esophagus - any longstanding h/o acid reflux, and any Esophageal dysmotility such narrowing or stricture or presbyesophagus, could impact the bolus motility throughout the Esophagus both superiorly and distally.    PLAN/RECOMMENDATIONS:  A. Diet: regular diet(meats cut small, moistened foods); thin liquids. Pills in Puree for easier swallowing if needed  B. Swallowing Precautions: general aspiration precautions; REFLUX precautions  C. Recommended consultation to GI for ongoing management of GERD; dysmotility   D. Therapy recommendations: None  E. Results and recommendations were discussed w/ patient and Daughter; video viewed and questions answered. Handouts given on general REFLUX precautions(baseline issue for pt)              Dysphagia, unspecified type  Oropharyngeal dysphagia - Plan: DG OP Swallowing Func-Medicare/Speech Path, DG OP Swallowing Func-Medicare/Speech Path  G-Codes - 22-Sep-2017 1649    Functional Assessment Tool Used  clinical judgement    Functional Limitations  Swallowing    Swallow Current Status (X7262)  At least 1  percent but less than 20 percent impaired, limited or restricted    Swallow Goal Status (M3559)  At least 1 percent but less than 20 percent impaired, limited or restricted    Swallow Discharge Status [phone])  At least 1 percent but less than 20 percent impaired, limited or restricted           Problem List There are no active problems to display for this patient.      Orinda Kenner, MS, CCC-SLP Deaglan Lile 22-Sep-2017, 4:49 PM  [facility] [place], [place], [address] Phone: [phone]   Fax:     Name: Cathy Spears MRN: [id number] Date of Birth: 09-01-1935

## 2017-09-11 ENCOUNTER — Emergency Department
Admission: EM | Admit: 2017-09-11 | Discharge: 2017-09-11 | Disposition: A | Discharge status: Home / Self Care | Payer: Medicare HMO | Attending: Emergency Medicine | Admitting: Emergency Medicine

## 2017-09-11 ENCOUNTER — Other Ambulatory Visit: Payer: Self-pay

## 2017-09-11 ENCOUNTER — Encounter: Payer: Self-pay | Admitting: Emergency Medicine

## 2017-09-11 DIAGNOSIS — R040 Epistaxis: Secondary | ICD-10-CM | POA: Insufficient documentation

## 2017-09-11 DIAGNOSIS — Z853 Personal history of malignant neoplasm of breast: Secondary | ICD-10-CM | POA: Diagnosis not present

## 2017-09-11 DIAGNOSIS — Z7982 Long term (current) use of aspirin: Secondary | ICD-10-CM | POA: Insufficient documentation

## 2017-09-11 DIAGNOSIS — I10 Essential (primary) hypertension: Secondary | ICD-10-CM | POA: Insufficient documentation

## 2017-09-11 LAB — CBC
HEMATOCRIT: 39.1 % (ref 35.0–47.0)
HEMOGLOBIN: 13 g/dL (ref 12.0–16.0)
MCH: 27.5 pg (ref 26.0–34.0)
MCHC: 33.2 g/dL (ref 32.0–36.0)
MCV: 82.8 fL (ref 80.0–100.0)
Platelets: 210 10*3/uL (ref 150–440)
RBC: 4.72 MIL/uL (ref 3.80–5.20)
RDW: 16.1 % — ABNORMAL HIGH (ref 11.5–14.5)
WBC: 7.4 10*3/uL (ref 3.6–11.0)

## 2017-09-11 MED ORDER — SILVER NITRATE-POT NITRATE 75-25 % EX MISC
CUTANEOUS | Status: AC
  Filled 2017-09-11: qty 2

## 2017-09-11 MED ORDER — OXYMETAZOLINE HCL 0.05 % NA SOLN: 1.0000 | Freq: Once | NASAL | Status: DC

## 2017-09-11 NOTE — ED Triage Notes (Signed)
Pt states nosebleed for 30 min this am at 0730, she sneezed at around noon today, nose started bleeding, again, cotton ball in nose and no active bleed noted at this time.

## 2017-09-11 NOTE — ED Provider Notes (Addendum)
[facility] Emergency Department Provider Note  ____________________________________________   I have reviewed the triage vital signs and the nursing notes. Where available I have reviewed prior notes and, if possible and indicated, outside hospital notes.    HISTORY  Chief Complaint Epistaxis    HPI Cathy Spears is a 82 y.o. female of deviated septum, since she was a child only able to breathe really well out of the left nostril in her mouth, presents with very slight bleeding out of her left nares which is stopped.  She had another similar episode briefly this morning.  Would also stop.  She is on aspirin but no other blood thinners.  She is somewhat anxious about this.  She denies any chest pain shortness breath nausea or vomiting.  No other bleeding or bruising.    Past Medical History:  Diagnosis Date  . Anxiety   . Cancer (HCC)    ,bil lymph nodes removed  breast  . Esophageal erosions   . Hypertension    dr Saralyn Pilar  at [facility] clinic    There are no active problems to display for this patient.   Past Surgical History:  Procedure Laterality Date  . BREAST SURGERY     lumpectomy bil,pt. states lymph nodes were removed and  the left arm is restricted  . HERNIA REPAIR    . LUMBAR LAMINECTOMY/DECOMPRESSION MICRODISCECTOMY  08/22/2011   Procedure: LUMBAR LAMINECTOMY/DECOMPRESSION MICRODISCECTOMY;  Surgeon: Ophelia Charter, MD;  Location: [facility] NEURO ORS;  Service: Neurosurgery;  Laterality: Right;  RIGHT Lumbar five sacral one  laminectomy and microdiscectomy    Prior to Admission medications   Medication Sig Start Date End Date Taking? Authorizing Provider  anastrozole (ARIMIDEX) 1 MG tablet Take 1 mg by mouth daily.    [provider]  aspirin 81 MG tablet Take 81 mg by mouth daily.    [provider]  cholecalciferol (VITAMIN D) 1000 UNITS tablet Take 1,000 Units by mouth daily.    [provider]  gabapentin  (NEURONTIN) 300 MG capsule Take 300 mg by mouth 2 (two) times daily.    [provider]  metoprolol (LOPRESSOR) 50 MG tablet Take 50 mg by mouth 2 (two) times daily.    [provider]  Multiple Vitamins-Minerals (PRESERVISION AREDS PO) Take 1 tablet by mouth 2 (two) times daily.    [provider]  pantoprazole (PROTONIX) 40 MG tablet Take 40 mg by mouth daily.    [provider]    Allergies Patient has no known allergies.  No family history on file.  Social History Social History   Tobacco Use  . Smoking status: Never Smoker  . Smokeless tobacco: Never Used  Substance Use Topics  . Alcohol use: No  . Drug use: No    Review of Systems Constitutional: No fever/chills Eyes: No visual changes. ENT: No sore throat. No stiff neck no neck pain Cardiovascular: Denies chest pain. Respiratory: Denies shortness of breath. Gastrointestinal:   no vomiting.  No diarrhea.  No constipation. Genitourinary: Negative for dysuria. Musculoskeletal: Negative lower extremity swelling Skin: Negative for rash. Neurological: Negative for severe headaches, focal weakness or numbness.   ____________________________________________   PHYSICAL EXAM:  VITAL SIGNS: ED Triage Vitals [09/11/17 1517]  Enc Vitals Group     BP (!) 186/91     Pulse Rate 65     Resp 18     Temp      Temp src      SpO2 100 %  Weight 185 lb (83.9 kg)     Height 5\' 3"  (1.6 m)     Head Circumference      Peak Flow      Pain Score      Pain Loc      Pain Edu?      Excl. in [place]?     Constitutional: Alert and oriented. Well appearing and in no acute distress. Eyes: Conjunctivae are normal Head: Atraumatic HEENT: No congestion/rhinnorhea there is no posterior bleed.  There is no active bleeding in the left nares.  There is a small scratch on the septum that appears not, not deep.  No septal hematoma, no active bleeding,. Mucous membranes are moist.  Oropharynx  non-erythematous Neck:   Nontender with no meningismus, no masses, no stridor Cardiovascular: Normal rate, regular rhythm. Grossly normal heart sounds.  Good peripheral circulation. Respiratory: Normal respiratory effort.  No retractions. Lungs CTAB. Skin:  Skin is warm, dry and intact. No rash noted. Psychiatric: Mood and affect are normal. Speech and behavior are normal.  ____________________________________________   LABS (all labs ordered are listed, but only abnormal results are displayed)  Labs Reviewed  CBC - Abnormal; Notable for the following components:      Result Value   RDW 16.1 (*)    All other components within normal limits    Pertinent labs  results that were available during my care of the patient were reviewed by me and considered in my medical decision making (see chart for details). ____________________________________________  EKG  I personally interpreted any EKGs ordered by me or triage  ____________________________________________  RADIOLOGY  Pertinent labs & imaging results that were available during my care of the patient were reviewed by me and considered in my medical decision making (see chart for details). If possible, patient and/or family made aware of any abnormal findings.  No results found. ____________________________________________    PROCEDURES  Procedure(s) performed: Cautery: Cautery to area of inflammation on the septum.  Silver nitrate, no comp occasions  Procedures  Critical Care performed: None  ____________________________________________   INITIAL IMPRESSION / ASSESSMENT AND PLAN / ED COURSE  Pertinent labs & imaging results that were available during my care of the patient were reviewed by me and considered in my medical decision making (see chart for details).  Recurrent nosebleeds no bleeding today, very scant bleeding hemoglobin and platelets are fine I have advised her not to take her aspirin, no bleeding here.,   I was able to cauterize a small area that I think likely was responsible.  Patient does have a deviated septum, and therefore, is especially poor candidate for packing, however, at this time it certainly is not indicated.  She will stop taking her aspirin.  She will apply pressure as needed at home and return for new or worrisome symptoms.  ----------------------------------------- 5:36 PM on 09/11/2017 ----------------------------------------- ' Pressure is incidentally noted to be elevated patient refuses all blood pressure checks except for on her calf because of a history of lymph node resection, she states this usually gives falsely elevated blood pressure readings.  She has no headache no chest pain or shortness of breath no symptoms of it she refuses to stay for further evaluation at this time.  Do not think this is what caused her nosebleeds and I did advise her to follow closely with primary care doctor for repeat blood pressure on Monday return precautions given for any symptoms. ____________________________________________   FINAL CLINICAL IMPRESSION(S) / ED DIAGNOSES  Final diagnoses:  None      This chart was dictated using voice recognition software.  Despite best efforts to proofread,  errors can occur which can change meaning.      Schuyler Amor, MD 09/11/17 1717    Schuyler Amor, MD 09/11/17 1720    Schuyler Amor, MD 09/11/17 1736    Schuyler Amor, MD 09/11/17 [phone]

## 2017-09-11 NOTE — Discharge Instructions (Signed)
return to the emergency room for any new or worrisome symptoms including bleeding from your nose.  If you have bleeding from your nose, grab ahold of the fleshy part of the nose and squeeze while watching the clock for 30 minutes.  If the bleeding persists or is very heavy return to the emergency department.  Do not take aspirin for the next week.  Follow closely with primary care doctor.  I would also advise that you take Vaseline, and very gently applied to the inside of that nostril to perform a barrier.  You may also consider getting a humidifier from [facility] or [facility] which may help limit nosebleeds, I would also advise that you not touch or rub your nose.

## 2017-09-23 ENCOUNTER — Encounter: Payer: Self-pay | Admitting: *Deleted

## 2017-09-24 ENCOUNTER — Encounter: Payer: Self-pay | Admitting: *Deleted

## 2017-09-24 ENCOUNTER — Ambulatory Visit: Payer: Medicare HMO | Admitting: Anesthesiology

## 2017-09-24 ENCOUNTER — Other Ambulatory Visit: Payer: Self-pay

## 2017-09-24 ENCOUNTER — Ambulatory Visit
Admission: RE | Admit: 2017-09-24 | Discharge: 2017-09-24 | Disposition: A | Discharge status: Home / Self Care | Payer: Medicare HMO | Source: Ambulatory Visit | Attending: Internal Medicine | Admitting: Internal Medicine

## 2017-09-24 ENCOUNTER — Encounter
Admission: RE | Disposition: A | Discharge status: Home / Self Care | Payer: Self-pay | Source: Ambulatory Visit | Attending: Internal Medicine

## 2017-09-24 DIAGNOSIS — Z79899 Other long term (current) drug therapy: Secondary | ICD-10-CM | POA: Diagnosis not present

## 2017-09-24 DIAGNOSIS — Z853 Personal history of malignant neoplasm of breast: Secondary | ICD-10-CM | POA: Diagnosis not present

## 2017-09-24 DIAGNOSIS — K219 Gastro-esophageal reflux disease without esophagitis: Secondary | ICD-10-CM | POA: Diagnosis not present

## 2017-09-24 DIAGNOSIS — Z91013 Allergy to seafood: Secondary | ICD-10-CM | POA: Diagnosis not present

## 2017-09-24 DIAGNOSIS — Z6832 Body mass index (BMI) 32.0-32.9, adult: Secondary | ICD-10-CM | POA: Diagnosis not present

## 2017-09-24 DIAGNOSIS — R1312 Dysphagia, oropharyngeal phase: Secondary | ICD-10-CM | POA: Insufficient documentation

## 2017-09-24 DIAGNOSIS — F419 Anxiety disorder, unspecified: Secondary | ICD-10-CM | POA: Insufficient documentation

## 2017-09-24 DIAGNOSIS — I10 Essential (primary) hypertension: Secondary | ICD-10-CM | POA: Diagnosis not present

## 2017-09-24 DIAGNOSIS — Z7982 Long term (current) use of aspirin: Secondary | ICD-10-CM | POA: Diagnosis not present

## 2017-09-24 DIAGNOSIS — E669 Obesity, unspecified: Secondary | ICD-10-CM | POA: Insufficient documentation

## 2017-09-24 DIAGNOSIS — Q398 Other congenital malformations of esophagus: Secondary | ICD-10-CM | POA: Diagnosis not present

## 2017-09-24 HISTORY — DX: Unspecified cataract: H26.9

## 2017-09-24 HISTORY — PX: ESOPHAGOGASTRODUODENOSCOPY (EGD) WITH PROPOFOL: SHX5813

## 2017-09-24 HISTORY — DX: Gastro-esophageal reflux disease without esophagitis: K21.9

## 2017-09-24 HISTORY — PX: BALLOON DILATION: SHX5330

## 2017-09-24 SURGERY — ESOPHAGOGASTRODUODENOSCOPY (EGD) WITH PROPOFOL
Anesthesia: General

## 2017-09-24 MED ORDER — SODIUM CHLORIDE 0.9 % IV SOLN
INTRAVENOUS | Status: DC
  Administered 2017-09-24: 08:00:00 via INTRAVENOUS

## 2017-09-24 MED ORDER — PROPOFOL 10 MG/ML IV BOLUS
INTRAVENOUS | Status: AC
  Filled 2017-09-24: qty 40

## 2017-09-24 MED ORDER — FENTANYL CITRATE (PF) 100 MCG/2ML IJ SOLN
INTRAMUSCULAR | Status: AC
  Filled 2017-09-24: qty 2

## 2017-09-24 MED ORDER — PROPOFOL 10 MG/ML IV BOLUS
INTRAVENOUS | Status: DC | PRN
  Administered 2017-09-24: 100 mg via INTRAVENOUS

## 2017-09-24 MED ORDER — LIDOCAINE HCL (CARDIAC) 20 MG/ML IV SOLN
INTRAVENOUS | Status: DC | PRN
  Administered 2017-09-24: 80 mg via INTRAVENOUS

## 2017-09-24 MED ORDER — FENTANYL CITRATE (PF) 100 MCG/2ML IJ SOLN
INTRAMUSCULAR | Status: DC | PRN
  Administered 2017-09-24: 50 ug via INTRAVENOUS

## 2017-09-24 NOTE — Anesthesia Post-op Follow-up Note (Signed)
Anesthesia QCDR form completed.        

## 2017-09-24 NOTE — Anesthesia Preprocedure Evaluation (Signed)
Anesthesia Evaluation  Patient identified by MRN, date of birth, ID band Patient awake    Reviewed: Allergy & Precautions, NPO status , Patient's Chart, lab work & pertinent test results  History of Anesthesia Complications (+) PONV and history of anesthetic complications  Airway Mallampati: III  TM Distance: >3 FB Neck ROM: Full    Dental  (+) Loose, Poor Dentition   Pulmonary neg pulmonary ROS, neg sleep apnea, neg COPD,    breath sounds clear to auscultation- rhonchi (-) wheezing      Cardiovascular hypertension, Pt. on medications (-) CAD, (-) Past MI, (-) Cardiac Stents and (-) CABG  Rhythm:Regular Rate:Normal - Systolic murmurs and - Diastolic murmurs    Neuro/Psych Anxiety negative neurological ROS     GI/Hepatic Neg liver ROS, GERD  ,  Endo/Other  negative endocrine ROSneg diabetes  Renal/GU negative Renal ROS     Musculoskeletal negative musculoskeletal ROS (+)   Abdominal (+) + obese,   Peds  Hematology negative hematology ROS (+)   Anesthesia Other Findings Past Medical History: No date: Anxiety No date: Cancer ([place])     Comment:  ,bil lymph nodes removed  breast No date: Cataracts, bilateral No date: Esophageal erosions No date: GERD (gastroesophageal reflux disease) No date: Hypertension     Comment:  dr Saralyn Pilar  at [facility] clinic   Reproductive/Obstetrics                             Anesthesia Physical Anesthesia Plan  ASA: II  Anesthesia Plan: General   Post-op Pain Management:    Induction: Intravenous  PONV Risk Score and Plan: 3 and Propofol infusion  Airway Management Planned: Natural Airway  Additional Equipment:   Intra-op Plan:   Post-operative Plan:   Informed Consent: I have reviewed the patients History and Physical, chart, labs and discussed the procedure including the risks, benefits and alternatives for the proposed anesthesia with the  patient or authorized representative who has indicated his/her understanding and acceptance.   Dental advisory given  Plan Discussed with: CRNA and Anesthesiologist  Anesthesia Plan Comments:         Anesthesia Quick Evaluation

## 2017-09-24 NOTE — Transfer of Care (Signed)
Immediate Anesthesia Transfer of Care Note  Patient: Cathy Spears  Procedure(s) Performed: ESOPHAGOGASTRODUODENOSCOPY (EGD) WITH PROPOFOL (N/A ) BALLOON DILATION (N/A )  Patient Location: PACU  Anesthesia Type:General  Level of Consciousness: awake, alert  and oriented  Airway & Oxygen Therapy: Patient Spontanous Breathing and Patient connected to nasal cannula oxygen  Post-op Assessment: Report given to RN  Post vital signs: Reviewed and stable  Last Vitals:  Vitals:   09/24/17 0740  BP: (!) 180/88  Pulse: (!) 56  Resp: 18  Temp: (!) 36.1 C  SpO2: 98%    Last Pain:  Vitals:   09/24/17 0740  TempSrc: Tympanic         Complications: No apparent anesthesia complications

## 2017-09-24 NOTE — Anesthesia Postprocedure Evaluation (Signed)
Anesthesia Post Note  Patient: Cathy Spears  Procedure(s) Performed: ESOPHAGOGASTRODUODENOSCOPY (EGD) WITH PROPOFOL (N/A ) BALLOON DILATION (N/A )  Patient location during evaluation: Endoscopy Anesthesia Type: General Level of consciousness: awake and alert and oriented Pain management: pain level controlled Vital Signs Assessment: post-procedure vital signs reviewed and stable Respiratory status: spontaneous breathing, nonlabored ventilation and respiratory function stable Cardiovascular status: blood pressure returned to baseline and stable Postop Assessment: no signs of nausea or vomiting Anesthetic complications: no     Last Vitals:  Vitals:   09/24/17 0910 09/24/17 0920  BP: 127/65 137/68  Pulse: (!) 57 (!) 57  Resp: (!) 22 (!) 24  Temp:    SpO2: 99% 98%    Last Pain:  Vitals:   09/24/17 0850  TempSrc: Tympanic                 Soraya Paquette

## 2017-09-24 NOTE — H&P (Signed)
Outpatient short stay form Pre-procedure 09/24/2017 8:43 AM Cathy Spears K. [name], M.D.  Primary Physician: Jillyn Ledger, M.D.  Reason for visit: Dysphagia, GERD  History of present illness:  Patient is a 82 year old female with a history of atypical GERD with resulting coughing which had M previously improved with protonix. Since her dose was changed to twice daily her nighttime coughing has improved. She's had several episodes of "choking" with coughing even upright not related to meals. There does not appear to be any classic transit dysphagia with eating and her recent modified barium swallow study was normal.    Current Facility-Administered Medications:  .  0.9 %  sodium chloride infusion, , Intravenous, Continuous, [place], Benay Pike, MD, Last Rate: 20 mL/hr at 09/24/17 0815  Medications Prior to Admission  Medication Sig Dispense Refill Last Dose  . anastrozole (ARIMIDEX) 1 MG tablet Take 1 mg by mouth daily.   09/23/2017 at Unknown time  . aspirin 81 MG tablet Take 81 mg by mouth daily.   Past Week at Unknown time  . Calcium Carbonate-Vitamin D (TH CALCIUM CARBONATE-VITAMIN D) 600-400 MG-UNIT tablet Take 1 tablet by mouth 2 (two) times daily.   09/23/2017 at Unknown time  . cholecalciferol (VITAMIN D) 1000 UNITS tablet Take 1,000 Units by mouth daily.   09/23/2017 at Unknown time  . hydrochlorothiazide (HYDRODIURIL) 12.5 MG tablet Take 25 mg by mouth daily.    09/24/2017 at 0640  . HYDROcodone-homatropine (HYCODAN) 5-1.5 MG/5ML syrup Take 5 mLs by mouth every 6 (six) hours as needed for cough.     . metoprolol (LOPRESSOR) 50 MG tablet Take 50 mg by mouth 2 (two) times daily.   09/24/2017 at 0540  . Multiple Vitamins-Minerals (PRESERVISION AREDS PO) Take 1 tablet by mouth 2 (two) times daily.   09/23/2017 at Unknown time  . omeprazole (PRILOSEC) 40 MG capsule Take 40 mg by mouth daily.   09/23/2017 at Unknown time  . gabapentin (NEURONTIN) 300 MG capsule Take 300 mg by mouth 2 (two) times daily.    Past Week at Unknown  . pantoprazole (PROTONIX) 40 MG tablet Take 40 mg by mouth daily.   Not Taking at Unknown time     Allergies  Allergen Reactions  . Fish-Derived Products      Past Medical History:  Diagnosis Date  . Anxiety   . Cancer (HCC)    ,bil lymph nodes removed  breast  . Cataracts, bilateral   . Esophageal erosions   . GERD (gastroesophageal reflux disease)   . Hypertension    dr Saralyn Pilar  at [place] clinic    Review of systems:      Physical Exam  General appearance: alert, cooperative and appears stated age Resp: clear to auscultation bilaterally Cardio: regular rate and rhythm, S1, S2 normal, no murmur, click, rub or gallop GI: soft, non-tender; bowel sounds normal; no masses,  no organomegaly Extremities: extremities normal, atraumatic, no cyanosis or edema     Planned procedures: Proceed with EGD.The patient understands the nature of the planned procedure, indications, risks, alternatives and potential complications including but not limited to bleeding, infection, perforation, damage to internal organs and possible oversedation/side effects from anesthesia. The patient agrees and gives consent to proceed.  Please refer to procedure notes for findings, recommendations and patient disposition/instructions.    [name] K. [name], M.D. Gastroenterology 09/24/2017  8:43 AM    `

## 2017-09-24 NOTE — Op Note (Signed)
[facility] Gastroenterology Patient Name: Cathy Spears Procedure Date: 09/24/2017 8:31 AM MRN: [id number] Account #: [id number] Date of Birth: 1935-09-04 Admit Type: Outpatient Age: 82 Room: [facility] ENDO ROOM 4 Gender: Female Note Status: Finalized Procedure:            Upper GI endoscopy Indications:          Oropharyngeal phase dysphagia, Follow-up of esophageal                        reflux Providers:            Benay Pike. [name] MD, MD Referring MD:         Rusty Aus, MD (Referring MD) Medicines:            Propofol per Anesthesia Complications:        No immediate complications. Procedure:            Pre-Anesthesia Assessment:                       - The risks and benefits of the procedure and the                        sedation options and risks were discussed with the                        patient. All questions were answered and informed                        consent was obtained.                       - Patient identification and proposed procedure were                        verified prior to the procedure by the nurse. The                        procedure was verified in the procedure room.                       - ASA Grade Assessment: III - A patient with severe                        systemic disease.                       - After reviewing the risks and benefits, the patient                        was deemed in satisfactory condition to undergo the                        procedure.                       After obtaining informed consent, the endoscope was                        passed under direct vision. Throughout the procedure,  the patient's blood pressure, pulse, and oxygen                        saturations were monitored continuously. The Endoscope                        was introduced through the mouth, and advanced to the                        third part of duodenum. The upper GI endoscopy was         accomplished without difficulty. The patient tolerated                        the procedure well. Findings:      No endoscopic abnormality was evident in the esophagus to explain the       patient's complaint of dysphagia.      The mid esophagus was mildly tortuous. Findings consistent with       presbyesophagus.      The entire examined stomach was normal.      The cardia and gastric fundus were normal on retroflexion.      The examined duodenum was normal. Impression:           - No endoscopic esophageal abnormality to explain                        patient's dysphagia.                       - Tortuous esophagus.                       - Normal stomach.                       - Normal examined duodenum.                       - No specimens collected. Recommendation:       - Patient has a contact number available for                        emergencies. The signs and symptoms of potential                        delayed complications were discussed with the patient.                        Return to normal activities tomorrow. Written discharge                        instructions were provided to the patient.                       - Resume previous diet.                       - Continue present medications.                       - No repeat upper endoscopy.                       -  The findings and recommendations were discussed with                        the patient and their family. Procedure Code(s):    --- Professional ---                       [phone], Esophagogastroduodenoscopy, flexible, transoral;                        diagnostic, including collection of specimen(s) by                        brushing or washing, when performed (separate procedure) Diagnosis Code(s):    --- Professional ---                       Q39.9, Congenital malformation of esophagus, unspecified                       R13.12, Dysphagia, oropharyngeal phase                       K21.9, Gastro-esophageal  reflux disease without                        esophagitis CPT copyright 2016 American Medical Association. All rights reserved. The codes documented in this report are preliminary and upon coder review may  be revised to meet current compliance requirements. Efrain Sella MD, MD 09/24/2017 8:55:28 AM This report has been signed electronically. Number of Addenda: 0 Note Initiated On: 09/24/2017 8:31 AM      [facility]

## 2017-09-24 NOTE — Interval H&P Note (Signed)
History and Physical Interval Note:  09/24/2017 8:44 AM  Cathy Spears  has presented today for surgery, with the diagnosis of DYSPHAGIA  The various methods of treatment have been discussed with the patient and family. After consideration of risks, benefits and other options for treatment, the patient has consented to  Procedure(s): ESOPHAGOGASTRODUODENOSCOPY (EGD) WITH PROPOFOL (N/A) BALLOON DILATION (N/A) as a surgical intervention .  The patient's history has been reviewed, patient examined, no change in status, stable for surgery.  I have reviewed the patient's chart and labs.  Questions were answered to the patient's satisfaction.     [place], [place]

## 2017-09-25 ENCOUNTER — Encounter: Payer: Self-pay | Admitting: Internal Medicine

## 2017-12-10 ENCOUNTER — Other Ambulatory Visit: Payer: Self-pay | Admitting: Specialist

## 2017-12-10 DIAGNOSIS — R05 Cough: Secondary | ICD-10-CM

## 2017-12-10 DIAGNOSIS — R053 Chronic cough: Secondary | ICD-10-CM

## 2017-12-10 DIAGNOSIS — R0609 Other forms of dyspnea: Principal | ICD-10-CM

## 2017-12-18 ENCOUNTER — Ambulatory Visit
Admission: RE | Admit: 2017-12-18 | Discharge: 2017-12-18 | Disposition: A | Discharge status: Home / Self Care | Payer: Medicare HMO | Source: Ambulatory Visit | Attending: Specialist | Admitting: Specialist

## 2017-12-18 DIAGNOSIS — R053 Chronic cough: Secondary | ICD-10-CM

## 2017-12-18 DIAGNOSIS — R0609 Other forms of dyspnea: Secondary | ICD-10-CM | POA: Diagnosis present

## 2017-12-18 DIAGNOSIS — R05 Cough: Secondary | ICD-10-CM | POA: Insufficient documentation

## 2018-01-08 ENCOUNTER — Other Ambulatory Visit: Payer: Self-pay | Admitting: Hematology and Oncology

## 2018-01-08 DIAGNOSIS — Z1231 Encounter for screening mammogram for malignant neoplasm of breast: Secondary | ICD-10-CM

## 2018-02-05 ENCOUNTER — Ambulatory Visit
Admission: RE | Admit: 2018-02-05 | Discharge: 2018-02-05 | Disposition: A | Discharge status: Home / Self Care | Payer: Medicare HMO | Source: Ambulatory Visit | Attending: Hematology and Oncology | Admitting: Hematology and Oncology

## 2018-02-05 DIAGNOSIS — Z1231 Encounter for screening mammogram for malignant neoplasm of breast: Secondary | ICD-10-CM

## 2018-02-05 HISTORY — DX: Personal history of irradiation: Z92.3

## 2018-02-05 HISTORY — DX: Malignant neoplasm of unspecified site of unspecified female breast: C50.919

## 2018-02-06 ENCOUNTER — Other Ambulatory Visit: Payer: Self-pay | Admitting: Hematology and Oncology

## 2018-02-06 DIAGNOSIS — R928 Other abnormal and inconclusive findings on diagnostic imaging of breast: Secondary | ICD-10-CM

## 2018-02-10 ENCOUNTER — Ambulatory Visit
Admission: RE | Admit: 2018-02-10 | Discharge: 2018-02-10 | Disposition: A | Discharge status: Home / Self Care | Payer: Medicare HMO | Source: Ambulatory Visit | Attending: Hematology and Oncology | Admitting: Hematology and Oncology

## 2018-02-10 ENCOUNTER — Other Ambulatory Visit: Payer: Self-pay | Admitting: Hematology and Oncology

## 2018-02-10 DIAGNOSIS — R928 Other abnormal and inconclusive findings on diagnostic imaging of breast: Secondary | ICD-10-CM

## 2018-02-10 DIAGNOSIS — N6310 Unspecified lump in the right breast, unspecified quadrant: Secondary | ICD-10-CM

## 2018-02-12 ENCOUNTER — Ambulatory Visit
Admission: RE | Admit: 2018-02-12 | Discharge: 2018-02-12 | Disposition: A | Discharge status: Home / Self Care | Payer: Medicare HMO | Source: Ambulatory Visit | Attending: Hematology and Oncology | Admitting: Hematology and Oncology

## 2018-02-12 ENCOUNTER — Other Ambulatory Visit: Payer: Self-pay | Admitting: Hematology and Oncology

## 2018-02-12 DIAGNOSIS — N6310 Unspecified lump in the right breast, unspecified quadrant: Secondary | ICD-10-CM

## 2018-03-27 ENCOUNTER — Emergency Department: Payer: Medicare HMO

## 2018-03-27 ENCOUNTER — Other Ambulatory Visit: Payer: Self-pay

## 2018-03-27 ENCOUNTER — Emergency Department
Admission: EM | Admit: 2018-03-27 | Discharge: 2018-03-27 | Disposition: A | Discharge status: Home / Self Care | Payer: Medicare HMO | Attending: Emergency Medicine | Admitting: Emergency Medicine

## 2018-03-27 DIAGNOSIS — I10 Essential (primary) hypertension: Secondary | ICD-10-CM | POA: Insufficient documentation

## 2018-03-27 DIAGNOSIS — Z7982 Long term (current) use of aspirin: Secondary | ICD-10-CM | POA: Diagnosis not present

## 2018-03-27 DIAGNOSIS — Z79899 Other long term (current) drug therapy: Secondary | ICD-10-CM | POA: Diagnosis not present

## 2018-03-27 DIAGNOSIS — R0602 Shortness of breath: Secondary | ICD-10-CM | POA: Insufficient documentation

## 2018-03-27 DIAGNOSIS — R0600 Dyspnea, unspecified: Secondary | ICD-10-CM | POA: Diagnosis not present

## 2018-03-27 DIAGNOSIS — Z853 Personal history of malignant neoplasm of breast: Secondary | ICD-10-CM | POA: Diagnosis not present

## 2018-03-27 LAB — COMPREHENSIVE METABOLIC PANEL
ALBUMIN: 4.2 g/dL (ref 3.5–5.0)
ALK PHOS: 61 U/L (ref 38–126)
ALT: 13 U/L (ref 0–44)
ANION GAP: 8 (ref 5–15)
AST: 23 U/L (ref 15–41)
BILIRUBIN TOTAL: 0.8 mg/dL (ref 0.3–1.2)
BUN: 12 mg/dL (ref 8–23)
CALCIUM: 9.1 mg/dL (ref 8.9–10.3)
CO2: 27 mmol/L (ref 22–32)
Chloride: 98 mmol/L (ref 98–111)
Creatinine, Ser: 0.68 mg/dL (ref 0.44–1.00)
GFR calc Af Amer: >60 mL/min (ref >60)
GFR calc non Af Amer: >60 mL/min (ref >60)
GLUCOSE: 131 mg/dL — AB (ref 70–99)
POTASSIUM: 3.6 mmol/L (ref 3.5–5.1)
Sodium: 133 mmol/L — ABNORMAL LOW (ref 135–145)
TOTAL PROTEIN: 7.5 g/dL (ref 6.5–8.1)

## 2018-03-27 LAB — CBC WITH DIFFERENTIAL/PLATELET
BASOS ABS: 0.1 10*3/uL (ref 0–0.1)
BASOS PCT: 1 %
EOS ABS: 0 10*3/uL (ref 0–0.7)
Eosinophils Relative: 0 %
HEMATOCRIT: 36.9 % (ref 35.0–47.0)
HEMOGLOBIN: 12.7 g/dL (ref 12.0–16.0)
Lymphocytes Relative: 7 %
Lymphs Abs: 0.8 10*3/uL — ABNORMAL LOW (ref 1.0–3.6)
MCH: 27.2 pg (ref 26.0–34.0)
MCHC: 34.3 g/dL (ref 32.0–36.0)
MCV: 79.4 fL — ABNORMAL LOW (ref 80.0–100.0)
Monocytes Absolute: 0.2 10*3/uL (ref 0.2–0.9)
Monocytes Relative: 2 %
NEUTROS ABS: 9.9 10*3/uL — AB (ref 1.4–6.5)
NEUTROS PCT: 90 %
Platelets: 218 10*3/uL (ref 150–440)
RBC: 4.65 MIL/uL (ref 3.80–5.20)
RDW: 16.4 % — ABNORMAL HIGH (ref 11.5–14.5)
WBC: 11 10*3/uL (ref 3.6–11.0)

## 2018-03-27 NOTE — ED Notes (Signed)
Spoke with dr. Clearnce Hasten regarding pt's chief complaint. Order for chest xray and and cbc/cmp.

## 2018-03-27 NOTE — ED Provider Notes (Signed)
[facility] Emergency Department Provider Note  Time seen: 10:35 PM  I have reviewed the triage vital signs and the nursing notes.   HISTORY  Chief Complaint Shortness of Breath    HPI Cathy Spears is a 82 y.o. female with a past medical history of anxiety, gastric reflux, hypertension, presents to the emergency department for difficulty breathing.  According to the patient she was sleeping when she awoke gasping for air.  She states this is been happening for years.  She has been seen by her doctor initially thought it was due to gastric reflux, she was ultimately referred to a pulmonologist, they were treating her for reflux and states she was better for many years until recently when her symptoms started again.  She was actually at her cardiologist office today when she had symptoms recur which she describes as difficulty breathing which it feels like something is in her larynx or vocal cords preventing her from breathing in.  Last for 1 or 2 minutes and then resolves on its own.  Patient is completely symptom-free in the emergency department.     Past Medical History:  Diagnosis Date  . Anxiety   . Breast cancer ([place])   . Cancer (HCC)    ,bil lymph nodes removed  breast  . Cataracts, bilateral   . Esophageal erosions   . GERD (gastroesophageal reflux disease)   . Hypertension    dr Saralyn Pilar  at [facility]  . Personal history of radiation therapy     There are no active problems to display for this patient.   Past Surgical History:  Procedure Laterality Date  . BALLOON DILATION N/A 09/24/2017   Procedure: BALLOON DILATION;  Surgeon: Toledo, Benay Pike, MD;  Location: [facility] ENDOSCOPY;  Service: Gastroenterology;  Laterality: N/A;  . BREAST BIOPSY Right   . BREAST LUMPECTOMY Bilateral    2010  . BREAST SURGERY     lumpectomy bil,pt. states lymph nodes were removed and  the left arm is restricted  . COLONOSCOPY WITH ESOPHAGOGASTRODUODENOSCOPY  (EGD)    . ESOPHAGOGASTRODUODENOSCOPY (EGD) WITH PROPOFOL N/A 09/24/2017   Procedure: ESOPHAGOGASTRODUODENOSCOPY (EGD) WITH PROPOFOL;  Surgeon: Toledo, Benay Pike, MD;  Location: [facility] ENDOSCOPY;  Service: Gastroenterology;  Laterality: N/A;  . HERNIA REPAIR    . LUMBAR LAMINECTOMY/DECOMPRESSION MICRODISCECTOMY  08/22/2011   Procedure: LUMBAR LAMINECTOMY/DECOMPRESSION MICRODISCECTOMY;  Surgeon: Ophelia Charter, MD;  Location: [place] [facility];  Service: Neurosurgery;  Laterality: Right;  RIGHT Lumbar five sacral one  laminectomy and microdiscectomy    Prior to Admission medications   Medication Sig Start Date End Date Taking? Authorizing Provider  anastrozole (ARIMIDEX) 1 MG tablet Take 1 mg by mouth daily.    [provider]  aspirin 81 MG tablet Take 81 mg by mouth daily.    [provider]  Calcium Carbonate-Vitamin D (TH CALCIUM CARBONATE-VITAMIN D) 600-400 MG-UNIT tablet Take 1 tablet by mouth 2 (two) times daily.    [provider]  cholecalciferol (VITAMIN D) 1000 UNITS tablet Take 1,000 Units by mouth daily.    [provider]  gabapentin (NEURONTIN) 300 MG capsule Take 300 mg by mouth 2 (two) times daily.    [provider]  hydrochlorothiazide (HYDRODIURIL) 12.5 MG tablet Take 25 mg by mouth daily.     [provider]  HYDROcodone-homatropine (HYCODAN) 5-1.5 MG/5ML syrup Take 5 mLs by mouth every 6 (six) hours as needed for cough.    [provider]  metoprolol (LOPRESSOR) 50 MG  tablet Take 50 mg by mouth 2 (two) times daily.    [provider]  Multiple Vitamins-Minerals (PRESERVISION AREDS PO) Take 1 tablet by mouth 2 (two) times daily.    [provider]  omeprazole (PRILOSEC) 40 MG capsule Take 40 mg by mouth daily.    [provider]  pantoprazole (PROTONIX) 40 MG tablet Take 40 mg by mouth daily.    [provider]    Allergies  Allergen Reactions  . Fish-Derived Products     No  family history on file.  Social History Social History   Tobacco Use  . Smoking status: Never Smoker  . Smokeless tobacco: Never Used  Substance Use Topics  . Alcohol use: No  . Drug use: No    Review of Systems Constitutional: Negative for fever ENT: Negative for recent illness/congestion Cardiovascular: Negative for chest pain. Respiratory: Positive for intermittent shortness of breath last 1 to 2 minutes and then resolves. Gastrointestinal: Negative for abdominal pain All other ROS negative  ____________________________________________   PHYSICAL EXAM:  VITAL SIGNS: ED Triage Vitals  Enc Vitals Group     BP 03/27/18 1942 110/73     Pulse Rate 03/27/18 1942 80     Resp 03/27/18 1942 16     Temp 03/27/18 1942 98.4 F (36.9 C)     Temp Source 03/27/18 1942 Oral     SpO2 03/27/18 1942 98 %     Weight 03/27/18 1943 191 lb (86.6 kg)     Height 03/27/18 1943 5\' 3"  (1.6 m)     Head Circumference --      Peak Flow --      Pain Score 03/27/18 1943 0     Pain Loc --      Pain Edu? --      Excl. in [place]? --     Constitutional: Alert and oriented. Well appearing and in no distress. Eyes: Normal exam ENT   Head: Normocephalic and atraumatic.   Nose: No congestion/rhinnorhea.   Mouth/Throat: Mucous membranes are moist. Cardiovascular: Normal rate, regular rhythm.  Respiratory: Normal respiratory effort without tachypnea nor retractions. Breath sounds are clear and equal bilaterally. No wheezes/rales/rhonchi.  No stridor. Gastrointestinal: Soft and nontender. No distention. Musculoskeletal: Nontender with normal range of motion in all extremities.  Neurologic:  Normal speech and language. No gross focal neurologic deficits  Skin:  Skin is warm, dry and intact.  Psychiatric: Mood and affect are normal.   ____________________________________________  INITIAL IMPRESSION / ASSESSMENT AND PLAN / ED COURSE  Pertinent labs & imaging results that were available  during my care of the patient were reviewed by me and considered in my medical decision making (see chart for details).  Patient presents emergency department for intermittent episodes of difficulty breathing which she describes as gasping or feeling like she cannot breathe and feeling like she needs to cough to clear mucus.  States these episodes last 1 or 2 minutes and then resolve and she is symptom-free sometimes 4 weeks months or even years at the time.  Patient had an episode today while at her cardiologist office and they wanted her to come to the emergency department for evaluation.  They did prescribe the patient prednisone thinking that this could be due to soft tissue inflammation.  Currently the patient appears well she has no complaints she has clear lung sounds without wheeze rales or rhonchi.  No stridor.  Speaking clearly.  Does have occasional cough.  Denies any fever.  Patient's x-ray  does not appear to show any acute finding, chest x-ray is clear as well.  This could possibly be related to soft tissue swelling of the vocal cords or vocal cord spasms.  There is no stridor now no respiratory distress whatsoever difficulty breathing.  I discussed with the patient and family the likely best course of action would be following up with an ENT for fiberoptic visualization of her larynx.  Patient was prescribed prednisone today which she is starting.  I believe this is appropriate as well.  Patient's work-up today is otherwise nonrevealing and the patient appears very well.  We will discharge with ENT follow-up.  ____________________________________________   FINAL CLINICAL IMPRESSION(S) / ED DIAGNOSES  Dyspnea    Harvest Dark, MD 03/27/18 2244

## 2018-03-27 NOTE — ED Triage Notes (Signed)
Pt's daughter states pt with shob intervals since last pm. Pt has already seen primary md today for same. Pt's daugher states pt was recently diagnosed with copd, placed on prednisone today. Pt's daughter is concerned because she was told pt needed to see and ENT for a "scope" soon. Pt in no acute distress, clear breath sounds.

## 2018-03-27 NOTE — ED Notes (Addendum)
Patient is with daughter and husband and has been to Pulmonologist and saw PCP today who referred her to ENT. But patient is having episodes of grasping for air and has been having them for years.

## 2018-03-27 NOTE — Discharge Instructions (Addendum)
Please call the number provided for ENT to the morning to arrange a follow-up appointment as soon as possible for further evaluation and possible visualization of the airway.  Return to the emergency department for any further significant episodes, or any other symptom personally concerning to yourself.

## 2018-12-29 ENCOUNTER — Other Ambulatory Visit: Payer: Self-pay | Admitting: Hematology and Oncology

## 2018-12-29 DIAGNOSIS — Z1231 Encounter for screening mammogram for malignant neoplasm of breast: Secondary | ICD-10-CM

## 2019-01-11 ENCOUNTER — Encounter
Admission: RE | Admit: 2019-01-11 | Discharge: 2019-01-11 | Disposition: A | Discharge status: Home / Self Care | Payer: Medicare HMO | Source: Ambulatory Visit | Attending: Surgery | Admitting: Surgery

## 2019-01-11 ENCOUNTER — Other Ambulatory Visit: Payer: Self-pay

## 2019-01-11 DIAGNOSIS — Z1159 Encounter for screening for other viral diseases: Secondary | ICD-10-CM | POA: Insufficient documentation

## 2019-01-11 DIAGNOSIS — Z01812 Encounter for preprocedural laboratory examination: Secondary | ICD-10-CM | POA: Insufficient documentation

## 2019-01-11 HISTORY — DX: Nausea with vomiting, unspecified: R11.2

## 2019-01-11 HISTORY — DX: Other complications of anesthesia, initial encounter: T88.59XA

## 2019-01-11 HISTORY — DX: Other specified postprocedural states: Z98.890

## 2019-01-11 LAB — SURGICAL PCR SCREEN
MRSA, PCR: NEGATIVE
Staphylococcus aureus: NEGATIVE

## 2019-01-11 LAB — URINALYSIS, ROUTINE W REFLEX MICROSCOPIC
Bilirubin Urine: NEGATIVE
Glucose, UA: NEGATIVE mg/dL
Hgb urine dipstick: NEGATIVE
Ketones, ur: NEGATIVE mg/dL
Leukocytes,Ua: NEGATIVE
Nitrite: NEGATIVE
Protein, ur: NEGATIVE mg/dL
Specific Gravity, Urine: 1.004 — ABNORMAL LOW (ref 1.005–1.030)
pH: 7 (ref 5.0–8.0)

## 2019-01-11 LAB — TYPE AND SCREEN
ABO/RH(D): O POS
Antibody Screen: NEGATIVE

## 2019-01-11 NOTE — Patient Instructions (Signed)
Your procedure is scheduled on: Thursday 01/14/19 Report to [facility]. To find out your arrival time please call [phone] between 1PM - 3PM on Wednesday 01/13/19.  Remember: Instructions that are not followed completely may result in serious medical risk, up to and including death, or upon the discretion of your surgeon and anesthesiologist your surgery may need to be rescheduled.     _X__ 1. Do not eat food after midnight the night before your procedure.                 No gum chewing or hard candies. You may drink clear liquids up to 2 hours                 before you are scheduled to arrive for your surgery- DO not drink clear                 liquids within 2 hours of the start of your surgery.                 Clear Liquids include:  water, apple juice without pulp, clear carbohydrate                 drink such as Clearfast or Gatorade, Black Coffee or Tea (Do not add                 anything to coffee or tea).  __X__2.  On the morning of surgery brush your teeth with toothpaste and water, you                 may rinse your mouth with mouthwash if you wish.  Do not swallow any              toothpaste of mouthwash.     _X__ 3.  No Alcohol for 24 hours before or after surgery.   _X__ 4.  Do Not Smoke or use e-cigarettes For 24 Hours Prior to Your Surgery.                 Do not use any chewable tobacco products for at least 6 hours prior to                 surgery.  ____  5.  Bring all medications with you on the day of surgery if instructed.   __X__  6.  Notify your doctor if there is any change in your medical condition      (cold, fever, infections).     Do not wear jewelry, make-up, hairpins, clips or nail polish. Do not wear lotions, powders, or perfumes.  Do not shave 48 hours prior to surgery. Men may shave face and neck. Do not bring valuables to the hospital.    [facility] is not responsible for any belongings or  valuables.  Contacts, dentures/partials or body piercings may not be worn into surgery. Bring a case for your contacts, glasses or hearing aids, a denture cup will be supplied. Leave your suitcase in the car. After surgery it may be brought to your room. For patients admitted to the hospital, discharge time is determined by your treatment team.   Patients discharged the day of surgery will not be allowed to drive home.   Please read over the following fact sheets that you were given:   MRSA Information  __X__ Take these medicines the morning of surgery with A SIP OF WATER:  1. metoprolol (LOPRESSOR)   2. omeprazole (PRILOSEC  3.   4.  5.  6.  ____ Fleet Enema (as directed)   __X__ Use CHG Soap/SAGE wipes as directed  ____ Use inhalers on the day of surgery  ____ Stop metformin/Janumet/Farxiga 2 days prior to surgery    ____ Take 1/2 of usual insulin dose the night before surgery. No insulin the morning          of surgery.   ____ Stop Blood Thinners Coumadin/Plavix/Xarelto/Pleta/Pradaxa/Eliquis/Effient/Aspirin  on   Or contact your Surgeon, Cardiologist or Medical Doctor regarding  ability to stop your blood thinners  __X__ Stop Anti-inflammatories 7 days before surgery such as Advil, Ibuprofen, Motrin,  BC or Goodies Powder, Naprosyn, Naproxen, Aleve, Aspirin    __X__ Stop all herbal supplements, fish oil or vitamin E until after surgery.    ____ Bring C-Pap to the hospital.

## 2019-01-12 LAB — NOVEL CORONAVIRUS, NAA (HOSP ORDER, SEND-OUT TO REF LAB; TAT 18-24 HRS): SARS-CoV-2, NAA: NOT DETECTED

## 2019-01-13 LAB — URINE CULTURE: Culture: 30000 — AB

## 2019-01-14 ENCOUNTER — Inpatient Hospital Stay: Payer: Medicare HMO

## 2019-01-14 ENCOUNTER — Other Ambulatory Visit: Payer: Self-pay

## 2019-01-14 ENCOUNTER — Inpatient Hospital Stay: Payer: Medicare HMO | Admitting: Anesthesiology

## 2019-01-14 ENCOUNTER — Encounter
Admission: RE | Disposition: A | Discharge status: Home / Self Care | Payer: Self-pay | Source: Home / Self Care | Attending: Internal Medicine

## 2019-01-14 ENCOUNTER — Encounter: Payer: Self-pay | Admitting: *Deleted

## 2019-01-14 ENCOUNTER — Inpatient Hospital Stay
Admission: RE | Admit: 2019-01-14 | Discharge: 2019-01-17 | DRG: 470 | Disposition: A | Discharge status: Home Health | Payer: Medicare HMO | Attending: Internal Medicine | Admitting: Internal Medicine

## 2019-01-14 DIAGNOSIS — M1711 Unilateral primary osteoarthritis, right knee: Principal | ICD-10-CM | POA: Diagnosis present

## 2019-01-14 DIAGNOSIS — Z8261 Family history of arthritis: Secondary | ICD-10-CM | POA: Diagnosis not present

## 2019-01-14 DIAGNOSIS — Z96651 Presence of right artificial knee joint: Secondary | ICD-10-CM

## 2019-01-14 DIAGNOSIS — E871 Hypo-osmolality and hyponatremia: Secondary | ICD-10-CM | POA: Diagnosis not present

## 2019-01-14 DIAGNOSIS — R0600 Dyspnea, unspecified: Secondary | ICD-10-CM

## 2019-01-14 DIAGNOSIS — R0902 Hypoxemia: Secondary | ICD-10-CM | POA: Diagnosis not present

## 2019-01-14 DIAGNOSIS — M810 Age-related osteoporosis without current pathological fracture: Secondary | ICD-10-CM | POA: Diagnosis present

## 2019-01-14 DIAGNOSIS — I471 Supraventricular tachycardia: Secondary | ICD-10-CM | POA: Diagnosis not present

## 2019-01-14 DIAGNOSIS — K0889 Other specified disorders of teeth and supporting structures: Secondary | ICD-10-CM | POA: Diagnosis present

## 2019-01-14 DIAGNOSIS — J449 Chronic obstructive pulmonary disease, unspecified: Secondary | ICD-10-CM | POA: Diagnosis present

## 2019-01-14 DIAGNOSIS — I119 Hypertensive heart disease without heart failure: Secondary | ICD-10-CM | POA: Diagnosis present

## 2019-01-14 DIAGNOSIS — I480 Paroxysmal atrial fibrillation: Secondary | ICD-10-CM | POA: Diagnosis present

## 2019-01-14 DIAGNOSIS — I2510 Atherosclerotic heart disease of native coronary artery without angina pectoris: Secondary | ICD-10-CM | POA: Diagnosis present

## 2019-01-14 DIAGNOSIS — R5082 Postprocedural fever: Secondary | ICD-10-CM

## 2019-01-14 DIAGNOSIS — Z1159 Encounter for screening for other viral diseases: Secondary | ICD-10-CM

## 2019-01-14 DIAGNOSIS — K219 Gastro-esophageal reflux disease without esophagitis: Secondary | ICD-10-CM | POA: Diagnosis present

## 2019-01-14 DIAGNOSIS — I700 Atherosclerosis of aorta: Secondary | ICD-10-CM | POA: Diagnosis present

## 2019-01-14 DIAGNOSIS — Z79899 Other long term (current) drug therapy: Secondary | ICD-10-CM

## 2019-01-14 HISTORY — PX: TOTAL KNEE ARTHROPLASTY: SHX125

## 2019-01-14 HISTORY — DX: Cardiac arrhythmia, unspecified: I49.9

## 2019-01-14 LAB — CBC
HCT: 29.8 % — ABNORMAL LOW (ref 36.0–46.0)
HCT: 34.7 % — ABNORMAL LOW (ref 36.0–46.0)
Hemoglobin: 9.7 g/dL — ABNORMAL LOW (ref 12.0–15.0)
Hemoglobin: 11.4 g/dL — ABNORMAL LOW (ref 12.0–15.0)
MCH: 27.2 pg (ref 26.0–34.0)
MCH: 26.7 pg (ref 26.0–34.0)
MCHC: 32.6 g/dL (ref 30.0–36.0)
MCHC: 32.9 g/dL (ref 30.0–36.0)
MCV: 83.5 fL (ref 80.0–100.0)
MCV: 81.3 fL (ref 80.0–100.0)
Platelets: 181 10*3/uL (ref 150–400)
Platelets: 209 10*3/uL (ref 150–400)
RBC: 3.57 MIL/uL — ABNORMAL LOW (ref 3.87–5.11)
RBC: 4.27 MIL/uL (ref 3.87–5.11)
RDW: 14.9 % (ref 11.5–15.5)
RDW: 14.7 % (ref 11.5–15.5)
WBC: 12.7 10*3/uL — ABNORMAL HIGH (ref 4.0–10.5)
WBC: 12.2 10*3/uL — ABNORMAL HIGH (ref 4.0–10.5)
nRBC: 0 % (ref 0.0–0.2)
nRBC: 0 % (ref 0.0–0.2)

## 2019-01-14 LAB — COMPREHENSIVE METABOLIC PANEL
ALT: 16 U/L (ref 0–44)
AST: 20 U/L (ref 15–41)
Albumin: 3.7 g/dL (ref 3.5–5.0)
Alkaline Phosphatase: 49 U/L (ref 38–126)
Anion gap: 11 (ref 5–15)
BUN: 9 mg/dL (ref 8–23)
CO2: 23 mmol/L (ref 22–32)
Calcium: 8.5 mg/dL — ABNORMAL LOW (ref 8.9–10.3)
Chloride: 96 mmol/L — ABNORMAL LOW (ref 98–111)
Creatinine, Ser: 0.46 mg/dL (ref 0.44–1.00)
GFR calc Af Amer: >60 mL/min (ref >60)
GFR calc non Af Amer: >60 mL/min (ref >60)
Glucose, Bld: 168 mg/dL — ABNORMAL HIGH (ref 70–99)
Potassium: 3.7 mmol/L (ref 3.5–5.1)
Sodium: 130 mmol/L — ABNORMAL LOW (ref 135–145)
Total Bilirubin: 0.9 mg/dL (ref 0.3–1.2)
Total Protein: 6.7 g/dL (ref 6.5–8.1)

## 2019-01-14 LAB — MAGNESIUM: Magnesium: 1.7 mg/dL (ref 1.7–2.4)

## 2019-01-14 LAB — ABO/RH: ABO/RH(D): O POS

## 2019-01-14 LAB — TROPONIN I: Troponin I: <0.03 ng/mL (ref <0.03)

## 2019-01-14 SURGERY — ARTHROPLASTY, KNEE, TOTAL
Anesthesia: Spinal | Laterality: Right

## 2019-01-14 MED ORDER — LIDOCAINE HCL (PF) 2 % IJ SOLN
INTRAMUSCULAR | Status: AC
  Filled 2019-01-14: qty 10

## 2019-01-14 MED ORDER — TRANEXAMIC ACID 1000 MG/10ML IV SOLN
INTRAVENOUS | Status: DC | PRN
  Administered 2019-01-14: 1000 mg via TOPICAL

## 2019-01-14 MED ORDER — PHENYLEPHRINE HCL (PRESSORS) 10 MG/ML IV SOLN
INTRAVENOUS | Status: DC | PRN
  Administered 2019-01-14 (×2): 100 ug via INTRAVENOUS

## 2019-01-14 MED ORDER — HYDROCHLOROTHIAZIDE 25 MG PO TABS
12.5000 mg | ORAL_TABLET | Freq: Every day | ORAL | Status: DC
  Administered 2019-01-15 – 2019-01-16 (×2): 12.5 mg via ORAL
  Filled 2019-01-14 (×2): qty 1

## 2019-01-14 MED ORDER — BUPIVACAINE LIPOSOME 1.3 % IJ SUSP
INTRAMUSCULAR | Status: AC
  Filled 2019-01-14: qty 20

## 2019-01-14 MED ORDER — ACETAMINOPHEN 500 MG PO TABS
1000.0000 mg | ORAL_TABLET | Freq: Four times a day (QID) | ORAL | Status: AC
  Administered 2019-01-14 – 2019-01-15 (×4): 1000 mg via ORAL
  Filled 2019-01-14 (×5): qty 2

## 2019-01-14 MED ORDER — ONDANSETRON HCL 4 MG/2ML IJ SOLN
4.0000 mg | Freq: Four times a day (QID) | INTRAMUSCULAR | Status: DC | PRN
  Administered 2019-01-14 – 2019-01-15 (×2): 4 mg via INTRAVENOUS
  Filled 2019-01-14 (×3): qty 2

## 2019-01-14 MED ORDER — OCUVITE-LUTEIN PO CAPS
1.0000 | ORAL_CAPSULE | Freq: Two times a day (BID) | ORAL | Status: DC
  Filled 2019-01-14 (×8): qty 1

## 2019-01-14 MED ORDER — PANTOPRAZOLE SODIUM 40 MG PO TBEC
40.0000 mg | DELAYED_RELEASE_TABLET | Freq: Every day | ORAL | Status: DC
  Administered 2019-01-15 – 2019-01-17 (×3): 40 mg via ORAL
  Filled 2019-01-14 (×4): qty 1

## 2019-01-14 MED ORDER — PROPOFOL 500 MG/50ML IV EMUL
INTRAVENOUS | Status: DC | PRN
  Administered 2019-01-14: 30 ug/kg/min via INTRAVENOUS

## 2019-01-14 MED ORDER — SODIUM CHLORIDE 0.9 % IV SOLN
INTRAVENOUS | Status: DC | PRN
  Administered 2019-01-14: 60 mL

## 2019-01-14 MED ORDER — ONDANSETRON HCL 4 MG/2ML IJ SOLN
INTRAMUSCULAR | Status: DC | PRN
  Administered 2019-01-14: 4 mg via INTRAVENOUS

## 2019-01-14 MED ORDER — FLEET ENEMA 7-19 GM/118ML RE ENEM: 1.0000 | ENEMA | Freq: Once | RECTAL | Status: DC | PRN

## 2019-01-14 MED ORDER — OXYCODONE HCL 5 MG PO TABS
5.0000 mg | ORAL_TABLET | ORAL | Status: DC | PRN
  Filled 2019-01-14: qty 1

## 2019-01-14 MED ORDER — BUPIVACAINE-EPINEPHRINE (PF) 0.5% -1:200000 IJ SOLN
INTRAMUSCULAR | Status: AC
  Filled 2019-01-14: qty 30

## 2019-01-14 MED ORDER — CEFAZOLIN SODIUM-DEXTROSE 2-4 GM/100ML-% IV SOLN
2.0000 g | Freq: Once | INTRAVENOUS | Status: AC
  Administered 2019-01-14: 2 g via INTRAVENOUS

## 2019-01-14 MED ORDER — TRAMADOL HCL 50 MG PO TABS
50.0000 mg | ORAL_TABLET | Freq: Four times a day (QID) | ORAL | Status: DC | PRN
  Administered 2019-01-14 – 2019-01-17 (×4): 50 mg via ORAL
  Filled 2019-01-14 (×4): qty 1

## 2019-01-14 MED ORDER — ONDANSETRON HCL 4 MG PO TABS: 4.0000 mg | ORAL_TABLET | Freq: Four times a day (QID) | ORAL | Status: DC | PRN

## 2019-01-14 MED ORDER — PROPOFOL 10 MG/ML IV BOLUS
INTRAVENOUS | Status: AC
  Filled 2019-01-14: qty 40

## 2019-01-14 MED ORDER — BUPIVACAINE HCL (PF) 0.5 % IJ SOLN
INTRAMUSCULAR | Status: DC | PRN
  Administered 2019-01-14: 3 mL

## 2019-01-14 MED ORDER — CEFAZOLIN SODIUM-DEXTROSE 2-4 GM/100ML-% IV SOLN
2.0000 g | Freq: Four times a day (QID) | INTRAVENOUS | Status: AC
  Administered 2019-01-14 – 2019-01-15 (×3): 2 g via INTRAVENOUS
  Filled 2019-01-14 (×3): qty 100

## 2019-01-14 MED ORDER — METOPROLOL TARTRATE 50 MG PO TABS
50.0000 mg | ORAL_TABLET | Freq: Two times a day (BID) | ORAL | Status: DC
  Administered 2019-01-14 – 2019-01-17 (×6): 50 mg via ORAL
  Filled 2019-01-14 (×6): qty 1

## 2019-01-14 MED ORDER — BISACODYL 10 MG RE SUPP: 10.0000 mg | Freq: Every day | RECTAL | Status: DC | PRN

## 2019-01-14 MED ORDER — MIDAZOLAM HCL 2 MG/2ML IJ SOLN
INTRAMUSCULAR | Status: AC
  Filled 2019-01-14: qty 2

## 2019-01-14 MED ORDER — VITAMIN B-12 1000 MCG PO TABS
1000.0000 ug | ORAL_TABLET | Freq: Every day | ORAL | Status: DC
  Administered 2019-01-14: 1000 ug via ORAL
  Filled 2019-01-14 (×4): qty 1

## 2019-01-14 MED ORDER — BUPIVACAINE-EPINEPHRINE (PF) 0.5% -1:200000 IJ SOLN
INTRAMUSCULAR | Status: DC | PRN
  Administered 2019-01-14: 30 mL

## 2019-01-14 MED ORDER — DIPHENHYDRAMINE HCL 12.5 MG/5ML PO ELIX
12.5000 mg | ORAL_SOLUTION | ORAL | Status: DC | PRN
  Filled 2019-01-14: qty 10

## 2019-01-14 MED ORDER — MIDAZOLAM HCL 5 MG/5ML IJ SOLN
INTRAMUSCULAR | Status: DC | PRN
  Administered 2019-01-14 (×2): 1 mg via INTRAVENOUS

## 2019-01-14 MED ORDER — LACTATED RINGERS IV SOLN
INTRAVENOUS | Status: DC
  Administered 2019-01-14 (×3): via INTRAVENOUS

## 2019-01-14 MED ORDER — IOHEXOL 350 MG/ML SOLN
75.0000 mL | Freq: Once | INTRAVENOUS | Status: AC | PRN
  Administered 2019-01-14: 75 mL via INTRAVENOUS

## 2019-01-14 MED ORDER — SODIUM CHLORIDE FLUSH 0.9 % IV SOLN
INTRAVENOUS | Status: AC
  Filled 2019-01-14: qty 40

## 2019-01-14 MED ORDER — CEFAZOLIN SODIUM-DEXTROSE 2-4 GM/100ML-% IV SOLN
INTRAVENOUS | Status: AC
Start: 2019-01-14 — End: 2019-01-14
  Filled 2019-01-14: qty 100

## 2019-01-14 MED ORDER — CALCIUM CARBONATE-VITAMIN D 500-200 MG-UNIT PO TABS
1.0000 | ORAL_TABLET | Freq: Two times a day (BID) | ORAL | Status: DC
  Administered 2019-01-14: 1 via ORAL
  Filled 2019-01-14 (×7): qty 1

## 2019-01-14 MED ORDER — FENTANYL CITRATE (PF) 100 MCG/2ML IJ SOLN: 25.0000 ug | INTRAMUSCULAR | Status: DC | PRN

## 2019-01-14 MED ORDER — VITAMIN D 25 MCG (1000 UNIT) PO TABS
1000.0000 [IU] | ORAL_TABLET | Freq: Every day | ORAL | Status: DC
  Administered 2019-01-14: 13:00:00 1000 [IU] via ORAL
  Filled 2019-01-14 (×4): qty 1

## 2019-01-14 MED ORDER — OXYCODONE HCL 5 MG PO TABS: 5.0000 mg | ORAL_TABLET | Freq: Once | ORAL | Status: DC | PRN

## 2019-01-14 MED ORDER — ACETAMINOPHEN 325 MG PO TABS
325.0000 mg | ORAL_TABLET | Freq: Four times a day (QID) | ORAL | Status: DC | PRN
  Administered 2019-01-16 – 2019-01-17 (×3): 650 mg via ORAL
  Filled 2019-01-14 (×3): qty 2
  Filled 2019-01-14: qty 1
  Filled 2019-01-14: qty 2

## 2019-01-14 MED ORDER — METOCLOPRAMIDE HCL 5 MG/ML IJ SOLN: 5.0000 mg | Freq: Three times a day (TID) | INTRAMUSCULAR | Status: DC | PRN

## 2019-01-14 MED ORDER — OXYCODONE HCL 5 MG/5ML PO SOLN: 5.0000 mg | Freq: Once | ORAL | Status: DC | PRN

## 2019-01-14 MED ORDER — MAGNESIUM SULFATE 2 GM/50ML IV SOLN
2.0000 g | Freq: Once | INTRAVENOUS | Status: AC
  Administered 2019-01-15: 2 g via INTRAVENOUS
  Filled 2019-01-14: qty 50

## 2019-01-14 MED ORDER — SODIUM CHLORIDE 0.9 % IV SOLN
INTRAVENOUS | Status: DC
  Administered 2019-01-14 – 2019-01-16 (×4): via INTRAVENOUS

## 2019-01-14 MED ORDER — DOCUSATE SODIUM 100 MG PO CAPS
100.0000 mg | ORAL_CAPSULE | Freq: Two times a day (BID) | ORAL | Status: DC
  Administered 2019-01-14 – 2019-01-17 (×5): 100 mg via ORAL
  Filled 2019-01-14 (×7): qty 1

## 2019-01-14 MED ORDER — TRANEXAMIC ACID 1000 MG/10ML IV SOLN
INTRAVENOUS | Status: AC
  Filled 2019-01-14: qty 10

## 2019-01-14 MED ORDER — AMLODIPINE BESYLATE 5 MG PO TABS
5.0000 mg | ORAL_TABLET | Freq: Every day | ORAL | Status: DC
  Administered 2019-01-15: 5 mg via ORAL
  Filled 2019-01-14: qty 1

## 2019-01-14 MED ORDER — FENTANYL CITRATE (PF) 100 MCG/2ML IJ SOLN
INTRAMUSCULAR | Status: AC
  Filled 2019-01-14: qty 2

## 2019-01-14 MED ORDER — LACTATED RINGERS IV SOLN
INTRAVENOUS | Status: DC | PRN
  Administered 2019-01-14: 10:00:00 via INTRAVENOUS

## 2019-01-14 MED ORDER — HYDROMORPHONE HCL 1 MG/ML IJ SOLN
0.2500 mg | INTRAMUSCULAR | Status: DC | PRN
  Administered 2019-01-15: 0.5 mg via INTRAVENOUS
  Filled 2019-01-14: qty 1

## 2019-01-14 MED ORDER — ENOXAPARIN SODIUM 40 MG/0.4ML ~~LOC~~ SOLN
40.0000 mg | SUBCUTANEOUS | Status: DC
  Administered 2019-01-15 – 2019-01-16 (×2): 40 mg via SUBCUTANEOUS
  Filled 2019-01-14 (×2): qty 0.4

## 2019-01-14 MED ORDER — FENTANYL CITRATE (PF) 100 MCG/2ML IJ SOLN
INTRAMUSCULAR | Status: DC | PRN
  Administered 2019-01-14 (×4): 25 ug via INTRAVENOUS

## 2019-01-14 MED ORDER — MAGNESIUM HYDROXIDE 400 MG/5ML PO SUSP: 30.0000 mL | Freq: Every day | ORAL | Status: DC | PRN

## 2019-01-14 MED ORDER — METOCLOPRAMIDE HCL 10 MG PO TABS: 5.0000 mg | ORAL_TABLET | Freq: Three times a day (TID) | ORAL | Status: DC | PRN

## 2019-01-14 SURGICAL SUPPLY — 65 items
APL PRP STRL LF DISP 70% ISPRP (MISCELLANEOUS) ×1
BANDAGE ELASTIC 6 LF NS (GAUZE/BANDAGES/DRESSINGS) ×3 IMPLANT
BLADE SAW SAG 25X90X1.19 (BLADE) ×3 IMPLANT
BLADE SURG SZ20 CARB STEEL (BLADE) ×3 IMPLANT
BNDG CMPR MED 5X6 ELC HKLP NS (GAUZE/BANDAGES/DRESSINGS) ×1
BRNG TIB 0D 75X10 ANT STAB (Insert) ×1 IMPLANT
CANISTER SUCT 1200ML W/VALVE (MISCELLANEOUS) ×3 IMPLANT
CANISTER SUCT 3000ML PPV (MISCELLANEOUS) ×3 IMPLANT
CEMENT BONE R 1X40 (Cement) ×6 IMPLANT
CEMENT VACUUM MIXING SYSTEM (MISCELLANEOUS) ×3 IMPLANT
CHLORAPREP W/TINT 26 (MISCELLANEOUS) ×3 IMPLANT
COOLER POLAR GLACIER W/PUMP (MISCELLANEOUS) ×3 IMPLANT
COVER MAYO STAND STRL (DRAPES) ×3 IMPLANT
COVER WAND RF STERILE (DRAPES) ×3 IMPLANT
CUFF TOURN SGL QUICK 24 (TOURNIQUET CUFF)
CUFF TOURN SGL QUICK 30 (TOURNIQUET CUFF) ×3
CUFF TRNQT CYL 24X4X16.5-23 (TOURNIQUET CUFF) IMPLANT
CUFF TRNQT CYL 30X4X21-28X (TOURNIQUET CUFF) IMPLANT
DRAPE IMP U-DRAPE 54X76 (DRAPES) ×3 IMPLANT
DRAPE SHEET LG 3/4 BI-LAMINATE (DRAPES) ×3 IMPLANT
DRSG OPSITE POSTOP 4X10 (GAUZE/BANDAGES/DRESSINGS) ×3 IMPLANT
DRSG OPSITE POSTOP 4X8 (GAUZE/BANDAGES/DRESSINGS) ×3 IMPLANT
ELECT CAUTERY BLADE 6.4 (BLADE) ×3 IMPLANT
ELECT REM PT RETURN 9FT ADLT (ELECTROSURGICAL) ×3
ELECTRODE REM PT RTRN 9FT ADLT (ELECTROSURGICAL) ×1 IMPLANT
FEMORAL CR RIGHT 67.5MM (Joint) ×2 IMPLANT
GLOVE BIO SURGEON STRL SZ7.5 (GLOVE) ×12 IMPLANT
GLOVE BIO SURGEON STRL SZ8 (GLOVE) ×12 IMPLANT
GLOVE BIOGEL PI IND STRL 8 (GLOVE) ×1 IMPLANT
GLOVE BIOGEL PI INDICATOR 8 (GLOVE) ×2
GLOVE INDICATOR 8.0 STRL GRN (GLOVE) ×3 IMPLANT
GOWN STRL REUS W/ TWL LRG LVL3 (GOWN DISPOSABLE) ×1 IMPLANT
GOWN STRL REUS W/ TWL XL LVL3 (GOWN DISPOSABLE) ×1 IMPLANT
GOWN STRL REUS W/TWL LRG LVL3 (GOWN DISPOSABLE) ×3
GOWN STRL REUS W/TWL XL LVL3 (GOWN DISPOSABLE) ×3
HOLDER FOLEY CATH W/STRAP (MISCELLANEOUS) ×3 IMPLANT
HOOD PEEL AWAY FLYTE STAYCOOL (MISCELLANEOUS) ×9 IMPLANT
IMMBOLIZER KNEE 19 BLUE UNIV (SOFTGOODS) ×3 IMPLANT
INSERT TIB BEARING 75 10 (Insert) ×2 IMPLANT
KIT TURNOVER KIT A (KITS) ×3 IMPLANT
NDL SAFETY ECLIPSE 18X1.5 (NEEDLE) ×2 IMPLANT
NDL SPNL 20GX3.5 QUINCKE YW (NEEDLE) ×1 IMPLANT
NEEDLE HYPO 18GX1.5 SHARP (NEEDLE) ×6
NEEDLE SPNL 20GX3.5 QUINCKE YW (NEEDLE) ×3 IMPLANT
NS IRRIG 1000ML POUR BTL (IV SOLUTION) ×3 IMPLANT
PACK TOTAL KNEE (MISCELLANEOUS) ×3 IMPLANT
PAD WRAPON POLAR KNEE (MISCELLANEOUS) ×1 IMPLANT
PATELLA SERIES A (Orthopedic Implant) ×2 IMPLANT
PLATE KNEE TIBIAL 75MM FIXED (Plate) ×2 IMPLANT
PULSAVAC PLUS IRRIG FAN TIP (DISPOSABLE) ×3
SOL .9 NS 3000ML IRR  AL (IV SOLUTION) ×2
SOL .9 NS 3000ML IRR AL (IV SOLUTION) ×1
SOL .9 NS 3000ML IRR UROMATIC (IV SOLUTION) ×1 IMPLANT
STAPLER SKIN PROX 35W (STAPLE) ×3 IMPLANT
SUCTION FRAZIER HANDLE 10FR (MISCELLANEOUS) ×2
SUCTION TUBE FRAZIER 10FR DISP (MISCELLANEOUS) ×1 IMPLANT
SUT VIC AB 0 CT1 36 (SUTURE) ×9 IMPLANT
SUT VIC AB 2-0 CT1 27 (SUTURE) ×12
SUT VIC AB 2-0 CT1 TAPERPNT 27 (SUTURE) ×3 IMPLANT
SYR 10ML LL (SYRINGE) ×3 IMPLANT
SYR 20CC LL (SYRINGE) ×3 IMPLANT
SYR 30ML LL (SYRINGE) ×9 IMPLANT
TIP FAN IRRIG PULSAVAC PLUS (DISPOSABLE) ×1 IMPLANT
TRAY FOLEY MTR SLVR 16FR STAT (SET/KITS/TRAYS/PACK) ×3 IMPLANT
WRAPON POLAR PAD KNEE (MISCELLANEOUS) ×3

## 2019-01-14 NOTE — Progress Notes (Addendum)
Hr sustaining at 150's. Angela-NP at bedside. Notified about EKG results. Waiting for new order.

## 2019-01-14 NOTE — OR Nursing (Signed)
According to Dr. Roland Rack, the hospital gave permission for a daughter to remain with the patient throughout her entire stay.  Will review the restrictions provided by the hospital with the family.  Also, requested a greek interpreter for this patient.

## 2019-01-14 NOTE — TOC Progression Note (Signed)
Transition of Care [facility]) - Progression Note    Patient Details  Name: Cathy Spears MRN: [id number] Date of Birth: 04/07/1936  Transition of Care [facility] X[facility]) CM/SW Contact  Antonela Freiman, Lenice Llamas Phone Number: [phone]  01/14/2019, 5:21 PM  Clinical Narrative: Lovenox price requested.          Expected Discharge Plan and Services                                                 Social Determinants of Health (SDOH) Interventions    Readmission Risk Interventions No flowsheet data found.

## 2019-01-14 NOTE — Evaluation (Signed)
Physical Therapy Evaluation Patient Details Name: Cathy Spears MRN: [id number] DOB: [birth year] Today's Date: 01/14/2019   History of Present Illness  Pt admitted for R TKR. Pt allowed daughter as approved visitor.  Clinical Impression  Pt is a pleasant 83 year old female who was admitted for R TKR. Pt performs bed mobility with min assist, transfers with cga, and ambulation with cga and RW. Nausea and vomiting present, however subsided with movement, RN notified. Pt demonstrates ability to perform 10 SLRs with independence, therefore does not require KI for mobility. Pt demonstrates deficits with strength/ROM/mobility. Phone interpreter on while talking with patient and daughter. Appears motivated to participate. Would benefit from skilled PT to address above deficits and promote optimal return to PLOF. Recommend transition to [facility] upon discharge from acute hospitalization.      Follow Up Recommendations Home health PT    Equipment Recommendations  Rolling walker with 5" wheels;3in1 (PT)    Recommendations for Other Services       Precautions / Restrictions Precautions Precautions: Fall;Knee Precaution Booklet Issued: No Restrictions Weight Bearing Restrictions: Yes RLE Weight Bearing: Weight bearing as tolerated      Mobility  Bed Mobility Overal bed mobility: Needs Assistance Bed Mobility: Supine to Sit     Supine to sit: Min assist     General bed mobility comments: safe technique, nauseated, vomiting at bedside. Once upright, symptoms improved.  Transfers Overall transfer level: Needs assistance Equipment used: Rolling walker (2 wheeled) Transfers: Sit to/from Stand Sit to Stand: Min guard         General transfer comment: upright posture  Ambulation/Gait Ambulation/Gait assistance: Min guard Gait Distance (Feet): 5 Feet Assistive device: Rolling walker (2 wheeled) Gait Pattern/deviations: Step-to pattern     General Gait Details: ambulated to  recliner with RW. Slow step to gait pattern performed.  Stairs            Wheelchair Mobility    Modified Rankin (Stroke Patients Only)       Balance Overall balance assessment: Needs assistance Sitting-balance support: Feet supported Sitting balance-Leahy Scale: Good     Standing balance support: Bilateral upper extremity supported Standing balance-Leahy Scale: Good                               Pertinent Vitals/Pain Pain Assessment: Faces Faces Pain Scale: Hurts even more Pain Location: R knee Pain Descriptors / Indicators: Operative site guarding Pain Intervention(s): Limited activity within patient's tolerance;Ice applied    Home Living Family/patient expects to be discharged to:: Private residence Living Arrangements: Spouse/significant other Available Help at Discharge: Family(will have daughter staying with her) Type of Home: House Home Access: Stairs to enter Entrance Stairs-Rails: Right Entrance Stairs-Number of Steps: 5 Home Layout: One level Home Equipment: Cane - quad      Prior Function Level of Independence: Independent with assistive device(s)         Comments: was using QC prior to admission     Hand Dominance        Extremity/Trunk Assessment   Upper Extremity Assessment Upper Extremity Assessment: (B UE grossly 4/5)    Lower Extremity Assessment Lower Extremity Assessment: Generalized weakness(R LE grossly 3/5; L LE grossly 4/5)       Communication   Communication: No difficulties  Cognition Arousal/Alertness: Awake/alert Behavior During Therapy: WFL for tasks assessed/performed Overall Cognitive Status: Within Functional Limits for tasks assessed  General Comments      Exercises Total Joint Exercises Goniometric ROM: R knee AAROM: 6-78 degrees Other Exercises Other Exercises: supine ther-ex performed on R LE including AP, quad sets, SLRs, hip  abd/add, and seated knee flexion stretches. 10 reps   Assessment/Plan    PT Assessment Patient needs continued PT services  PT Problem List Decreased strength;Decreased range of motion;Decreased balance;Decreased mobility;Decreased knowledge of use of DME;Pain       PT Treatment Interventions DME instruction;Gait training;Stair training;Therapeutic exercise    PT Goals (Current goals can be found in the Care Plan section)  Acute Rehab PT Goals Patient Stated Goal: to get stronger PT Goal Formulation: With patient Time For Goal Achievement: 01/28/19 Potential to Achieve Goals: Good    Frequency BID   Barriers to discharge        Co-evaluation               AM-PAC PT "6 Clicks" Mobility  Outcome Measure Help needed turning from your back to your side while in a flat bed without using bedrails?: A Little Help needed moving from lying on your back to sitting on the side of a flat bed without using bedrails?: A Little Help needed moving to and from a bed to a chair (including a wheelchair)?: A Little Help needed standing up from a chair using your arms (e.g., wheelchair or bedside chair)?: A Little Help needed to walk in hospital room?: A Little Help needed climbing 3-5 steps with a railing? : A Lot 6 Click Score: 17    End of Session Equipment Utilized During Treatment: Gait belt Activity Tolerance: Patient tolerated treatment well Patient left: in chair;with chair alarm set;with SCD's reapplied;with family/visitor present Nurse Communication: Mobility status PT Visit Diagnosis: Muscle weakness (generalized) (M62.81);Difficulty in walking, not elsewhere classified (R26.2);Pain Pain - Right/Left: Right Pain - part of body: Knee    Time: 2831-5176 PT Time Calculation (min) (ACUTE ONLY): 47 min   Charges:   PT Evaluation $PT Eval Low Complexity: 1 Low PT Treatments $Gait Training: 8-22 mins $Therapeutic Exercise: 8-22 mins        Greggory Stallion, PT,  DPT [phone]   Cathy Spears 01/14/2019, 5:07 PM

## 2019-01-14 NOTE — Anesthesia Post-op Follow-up Note (Signed)
Anesthesia QCDR form completed.        

## 2019-01-14 NOTE — Anesthesia Preprocedure Evaluation (Addendum)
Anesthesia Evaluation  Patient identified by MRN, date of birth, ID band Patient awake    Reviewed: Allergy & Precautions, H&P , NPO status , Patient's Chart, lab work & pertinent test results  History of Anesthesia Complications (+) PONV and history of anesthetic complications  Airway Mallampati: III  TM Distance: <3 FB Neck ROM: limited    Dental  (+) Chipped, Poor Dentition   Pulmonary neg pulmonary ROS, neg shortness of breath,           Cardiovascular Exercise Tolerance: Good hypertension, + dysrhythmias Atrial Fibrillation      Neuro/Psych PSYCHIATRIC DISORDERS negative neurological ROS     GI/Hepatic negative GI ROS, Neg liver ROS, GERD  Medicated and Controlled,  Endo/Other  negative endocrine ROS  Renal/GU      Musculoskeletal   Abdominal   Peds  Hematology negative hematology ROS (+)   Anesthesia Other Findings Past Medical History: No date: Anxiety No date: Breast cancer (HCC) No date: Cancer ([place])     Comment:  ,bilateral lymph nodes removed  breast mostly from the               left No date: Cataracts, bilateral No date: Complication of anesthesia No date: Esophageal erosions No date: GERD (gastroesophageal reflux disease) No date: Hypertension     Comment:  dr Saralyn Pilar  at [facility] No date: Personal history of radiation therapy No date: PONV (postoperative nausea and vomiting)     Comment:  spinal anesthesia x1  Past Surgical History: 09/24/2017: BALLOON DILATION; N/A     Comment:  Procedure: BALLOON DILATION;  Surgeon: Toledo, Benay Pike, MD;  Location: [facility] ENDOSCOPY;  Service:               Gastroenterology;  Laterality: N/A; No date: BREAST BIOPSY; Right No date: BREAST LUMPECTOMY; Bilateral     Comment:  2010 No date: BREAST SURGERY     Comment:  lumpectomy bil,pt. states lymph nodes were removed and                the left arm is restricted No date:  COLONOSCOPY WITH ESOPHAGOGASTRODUODENOSCOPY (EGD) 09/24/2017: ESOPHAGOGASTRODUODENOSCOPY (EGD) WITH PROPOFOL; N/A     Comment:  Procedure: ESOPHAGOGASTRODUODENOSCOPY (EGD) WITH               PROPOFOL;  Surgeon: Toledo, Benay Pike, MD;  Location:               [facility] ENDOSCOPY;  Service: Gastroenterology;  Laterality:               N/A; No date: HERNIA REPAIR 08/22/2011: LUMBAR LAMINECTOMY/DECOMPRESSION MICRODISCECTOMY     Comment:  Procedure: LUMBAR LAMINECTOMY/DECOMPRESSION               MICRODISCECTOMY;  Surgeon: Ophelia Charter, MD;                Location: [place] [facility]UR[facility] ORS;  Service: Neurosurgery;                Laterality: Right;  RIGHT Lumbar five sacral one                laminectomy and microdiscectomy  BMI    Body Mass Index: 33.82 kg/m      Reproductive/Obstetrics negative OB ROS  Anesthesia Physical Anesthesia Plan  ASA: III  Anesthesia Plan: Spinal   Post-op Pain Management:    Induction:   PONV Risk Score and Plan:   Airway Management Planned: Natural Airway and Nasal Cannula  Additional Equipment:   Intra-op Plan:   Post-operative Plan:   Informed Consent: I have reviewed the patients History and Physical, chart, labs and discussed the procedure including the risks, benefits and alternatives for the proposed anesthesia with the patient or authorized representative who has indicated his/her understanding and acceptance.     Dental Advisory Given  Plan Discussed with: Anesthesiologist, CRNA and Surgeon  Anesthesia Plan Comments: (Patient reports no bleeding problems and no anticoagulant use.  Plan for spinal with backup GA  Patient and daughters consented for risks of anesthesia including but not limited to:  - adverse reactions to medications - risk of bleeding, infection, nerve damage and headache - risk of failed spinal - damage to teeth, lips or other oral mucosa - sore throat or hoarseness - Damage  to heart, brain, lungs or loss of life  They voiced understanding.)        Anesthesia Quick Evaluation

## 2019-01-14 NOTE — Anesthesia Procedure Notes (Addendum)
Spinal  Patient location during procedure: OR Start time: 01/14/2019 7:37 AM End time: 01/14/2019 7:41 AM Staffing Anesthesiologist: Piscitello, Precious Haws, MD Resident/CRNA: Gentry Fitz, CRNA Performed: resident/CRNA  Preanesthetic Checklist Completed: patient identified, site marked, surgical consent, pre-op evaluation, timeout performed, IV checked, risks and benefits discussed and monitors and equipment checked Spinal Block Patient position: sitting Prep: ChloraPrep Patient monitoring: heart rate, continuous pulse ox, blood pressure and cardiac monitor Approach: midline Location: L3-4 Injection technique: single-shot Needle Needle type: [place] and Introducer  Needle gauge: 24 G Needle length: 9 cm Assessment Sensory level: T10 Additional Notes Negative paresthesia. Negative blood return. Positive free-flowing CSF. Expiration date of kit checked and confirmed. Patient tolerated procedure well, without complications.

## 2019-01-14 NOTE — Op Note (Signed)
01/14/2019  10:07 AM  Patient:   Cathy Spears  Pre-Op Diagnosis:   Degenerative joint disease, right knee.  Post-Op Diagnosis:   Same  Procedure:   Right TKA using all-cemented [employer] Vanguard system with a 67.5 mm mm PCR femur, a 75 mm tibial tray with a 10 mm anterior stabilized E-poly insert, and a 31 x 8 mm all-poly 3-pegged domed patella.  Surgeon:   Pascal Lux, MD  Assistant:   Cameron Proud, PA-C   Anesthesia:   Spinal  Findings:   As above  Complications:   None  EBL:   10 cc  Fluids:   1200 cc crystalloid  UOP:   400 cc  TT:   100 minutes at 300 mmHg  Drains:   None  Closure:   Staples  Implants:   As above  Brief Clinical Note:   The patient is an 83 year old female with a long history of progressively worsening right knee pain. The patient's symptoms have progressed despite medications, activity modification, injections, etc. The patient's history and examination were consistent with advanced degenerative joint disease of the right knee confirmed by plain radiographs. The patient presents at this time for a right total knee arthroplasty.  Procedure:   The patient was brought into the operating room. After adequate spinal anesthesia was obtained, the patient was lain in the supine position. A Foley catheter was placed by the nurse before the right lower extremity was prepped with ChloraPrep solution and draped sterilely. Preoperative antibiotics were administered. After verifying the proper laterality with a surgical timeout, the limb was exsanguinated with an Esmarch and the tourniquet inflated to 300 mmHg. A standard anterior approach to the knee was made through an approximately 7 inch incision. The incision was carried down through the subcutaneous tissues to expose superficial retinaculum. This was split the length of the incision and the medial flap elevated sufficiently to expose the medial retinaculum. The medial retinaculum was incised, leaving a 3-4  mm cuff of tissue on the patella. This was extended distally along the medial border of the patellar tendon and proximally through the medial third of the quadriceps tendon. A subtotal fat pad excision was performed before the soft tissues were elevated off the anteromedial and anterolateral aspects of the proximal tibia to the level of the collateral ligaments. The anterior portions of the medial and lateral menisci were removed, as was the anterior cruciate ligament. With the knee flexed to 90, the external tibial guide was positioned and the appropriate proximal tibial cut made. This piece was taken to the back table where it was measured and found to be optimally replicated by a 75 mm component.  Attention was directed to the distal femur. The intramedullary canal was accessed through a 3/8" drill hole. The intramedullary guide was inserted and positioned in order to obtain a neutral flexion gap. The intercondylar block was positioned with care taken to avoid notching the anterior cortex of the femur. The appropriate cut was made. Next, the distal cutting block was placed at 6 of valgus alignment. Using the 9 mm slot, the distal cut was made. The distal femur was measured and found to be optimally replicated by the 18.5 mm component. The 67.5 mm 4-in-1 cutting block was positioned and first the posterior, then the posterior chamfer, the anterior chamfer, and finally the anterior cuts were made. At this point, the posterior portions medial and lateral menisci were removed. A trial reduction was performed using the appropriate femoral and tibial components  with the 10 mm insert. This demonstrated excellent stability to varus and valgus stressing both in flexion and extension while permitting full extension. Patella tracking was assessed and found to be excellent. Therefore, the tibial guide position was marked on the proximal tibia. The patella thickness was measured and found to be 21 mm. Therefore, the  appropriate cut was made. The patellar surface was measured and found to be optimally replicated by the 31 mm component. The three peg holes were drilled in place before the trial button was inserted. Patella tracking was assessed and found to be excellent, passing the "no thumb test". The lug holes were drilled into the distal femur before the trial component was removed, leaving only the tibial tray. The keel was then created using the appropriate tower, reamer, and punch.  The bony surfaces were prepared for cementing by irrigating them thoroughly with bacitracin saline solution via the jet lavage system. A bone plug was fashioned from some of the bone that had been removed previously and used to plug the distal femoral canal. In addition, 20 cc of Exparel diluted out to 60 cc with normal saline and 30 cc of 0.5% Sensorcaine were injected into the postero-medial and postero-lateral aspects of the knee, the medial and lateral gutter regions, and the peri-incisional tissues to help with postoperative analgesia. Meanwhile, the cement was being mixed on the back table. When it was ready, the tibial tray was cemented in first. The excess cement was removed using [employer] fast streamer. Next, the femoral component was impacted into place. Again, the excess cement was removed using [employer] fast streamer. The 10 mm trial insert was positioned and the knee brought into extension while the cement hardened. Finally, the patella was cemented into place and secured using the patellar clamp. Again, the excess cement was removed using [employer] fast streamer. Once the cement had hardened, the knee was placed through a range of motion with the findings as described above. Therefore, the trial insert was removed and, after verifying that no cement had been retained posteriorly, the permanent 10 mm anterior stabilized E-polyethylene insert was positioned and secured using the appropriate key locking mechanism. Again the knee was placed through a  range of motion with the findings as described above.  The wound was copiously irrigated with bacitracin saline solution using the jet lavage system before the quadriceps tendon and retinacular layer were reapproximated using #0 Vicryl interrupted sutures. The superficial retinacular layer also was closed using a running #0 Vicryl suture. A total of 10 cc of transexemic acid (TXA) was injected intra-articularly before the subcutaneous tissues were closed in several layers using 2-0 Vicryl interrupted sutures. The skin was closed using staples. A sterile honeycomb dressing was applied to the skin before the leg was wrapped with an Ace wrap to accommodate the Polar Care device. The patient was then awakened and returned to the recovery room in satisfactory condition after tolerating the procedure well.

## 2019-01-14 NOTE — Progress Notes (Signed)
Pt HR 153, Oxygen sats 88 on room air. O2 L Oxygen given. O2 94 on 2L oxygen. Dr. Roland Rack made aware. Order for EKG, Tele obtained. Dr. Roland Rack stated that he will not talk to prime doctor. Schedule metoprolol given. CXR odered by Angel-NP.

## 2019-01-14 NOTE — Transfer of Care (Signed)
Immediate Anesthesia Transfer of Care Note  Patient: Cathy Spears  Procedure(s) Performed: TOTAL KNEE ARTHROPLASTY RIGHT (Right )  Patient Location: PACU  Anesthesia Type:Spinal  Level of Consciousness: awake, alert  and oriented  Airway & Oxygen Therapy: Patient Spontanous Breathing and Patient connected to face mask oxygen  Post-op Assessment: Report given to RN and Post -op Vital signs reviewed and stable  Post vital signs: Reviewed and stable  Last Vitals:  Vitals Value Taken Time  BP 109/57 01/14/19 1009  Temp    Pulse 66 01/14/19 1010  Resp 14 01/14/19 1010  SpO2 95 % 01/14/19 1010  Vitals shown include unvalidated device data.  Last Pain:  Vitals:   01/14/19 1000  TempSrc:   PainSc: (P) 0-No pain      Patients Stated Pain Goal: 1 (18/28/83 3744)  Complications: No apparent anesthesia complications

## 2019-01-14 NOTE — Anesthesia Procedure Notes (Addendum)
Spinal  Patient location during procedure: OR Start time: 01/14/2019 7:38 AM End time: 01/14/2019 7:42 AM Staffing Resident/CRNA: Gentry Fitz, CRNA Performed: resident/CRNA  Preanesthetic Checklist Completed: patient identified, site marked, surgical consent, pre-op evaluation, timeout performed, IV checked, risks and benefits discussed and monitors and equipment checked Spinal Block Patient position: sitting Prep: ChloraPrep Patient monitoring: heart rate, continuous pulse ox and blood pressure Approach: midline Location: L3-4 Injection technique: single-shot Needle Needle type: Pencan  Needle gauge: 24 G Assessment Sensory level: T10 Additional Notes Patient in sitting position. Monitors applied.  Landmarks identified.  Area prepped and draped. L3/4 area localized with 18ml of 1% lidocaine. 20G introducer placed into L4/4 interspace.24G pencan spinal needle placed smoothly x 1 attempt.  +CSF. Negative heme, no parathesia. Tolerated procedure well. Patient placed in supine position. T10 level noted at this time.  BBraun spinal set lot# [id number] exp 05-29-2019

## 2019-01-14 NOTE — H&P (Signed)
Paper H&P to be scanned into permanent record. H&P reviewed and patient re-examined. No changes. 

## 2019-01-15 ENCOUNTER — Inpatient Hospital Stay
Admission: RE | Admit: 2019-01-15 | Discharge: 2019-01-15 | Disposition: A | Discharge status: Home / Self Care | Payer: Medicare HMO | Source: Home / Self Care | Attending: Nurse Practitioner | Admitting: Nurse Practitioner

## 2019-01-15 LAB — CBC WITH DIFFERENTIAL/PLATELET
Abs Immature Granulocytes: 0.06 10*3/uL (ref 0.00–0.07)
Basophils Absolute: 0 10*3/uL (ref 0.0–0.1)
Basophils Relative: 0 %
Eosinophils Absolute: 0 10*3/uL (ref 0.0–0.5)
Eosinophils Relative: 0 %
HCT: 32.8 % — ABNORMAL LOW (ref 36.0–46.0)
Hemoglobin: 11.1 g/dL — ABNORMAL LOW (ref 12.0–15.0)
Immature Granulocytes: 1 %
Lymphocytes Relative: 5 %
Lymphs Abs: 0.6 10*3/uL — ABNORMAL LOW (ref 0.7–4.0)
MCH: 27.2 pg (ref 26.0–34.0)
MCHC: 33.8 g/dL (ref 30.0–36.0)
MCV: 80.4 fL (ref 80.0–100.0)
Monocytes Absolute: 0.6 10*3/uL (ref 0.1–1.0)
Monocytes Relative: 5 %
Neutro Abs: 10.4 10*3/uL — ABNORMAL HIGH (ref 1.7–7.7)
Neutrophils Relative %: 89 %
Platelets: 200 10*3/uL (ref 150–400)
RBC: 4.08 MIL/uL (ref 3.87–5.11)
RDW: 14.6 % (ref 11.5–15.5)
WBC: 11.7 10*3/uL — ABNORMAL HIGH (ref 4.0–10.5)
nRBC: 0 % (ref 0.0–0.2)

## 2019-01-15 LAB — ECHOCARDIOGRAM COMPLETE
Height: 63 in
Weight: 3064 oz

## 2019-01-15 LAB — BASIC METABOLIC PANEL
Anion gap: 9 (ref 5–15)
BUN: 8 mg/dL (ref 8–23)
CO2: 25 mmol/L (ref 22–32)
Calcium: 8.6 mg/dL — ABNORMAL LOW (ref 8.9–10.3)
Chloride: 95 mmol/L — ABNORMAL LOW (ref 98–111)
Creatinine, Ser: 0.4 mg/dL — ABNORMAL LOW (ref 0.44–1.00)
GFR calc Af Amer: >60 mL/min (ref >60)
GFR calc non Af Amer: >60 mL/min (ref >60)
Glucose, Bld: 151 mg/dL — ABNORMAL HIGH (ref 70–99)
Potassium: 3.9 mmol/L (ref 3.5–5.1)
Sodium: 129 mmol/L — ABNORMAL LOW (ref 135–145)

## 2019-01-15 LAB — TROPONIN I
Troponin I: <0.03 ng/mL (ref <0.03)
Troponin I: <0.03 ng/mL (ref <0.03)

## 2019-01-15 MED ORDER — METOPROLOL TARTRATE 5 MG/5ML IV SOLN: 2.5000 mg | Freq: Once | INTRAVENOUS | Status: DC

## 2019-01-15 MED ORDER — ONDANSETRON HCL 4 MG/2ML IJ SOLN
4.0000 mg | Freq: Once | INTRAMUSCULAR | Status: AC
  Administered 2019-01-15: 4 mg via INTRAVENOUS

## 2019-01-15 NOTE — Anesthesia Postprocedure Evaluation (Signed)
Anesthesia Post Note  Patient: Cathy Spears  Procedure(s) Performed: TOTAL KNEE ARTHROPLASTY RIGHT (Right )  Patient location during evaluation: Nursing Unit Anesthesia Type: Spinal Level of consciousness: awake and alert and oriented Pain management: pain level controlled Vital Signs Assessment: post-procedure vital signs reviewed and stable Cardiovascular status: blood pressure returned to baseline Postop Assessment: no headache Anesthetic complications: no Comments: Daughter complains of nausea and vomiting, questioned about pain meds (possible narcotics).  Still on IV fluid, has had zofran X 2     Last Vitals:  Vitals:   01/15/19 0349 01/15/19 0805  BP: 138/62 (!) 125/56  Pulse: 70 64  Resp:  20  Temp: 36.9 C 37 C  SpO2: 93% 94%    Last Pain:  Vitals:   01/15/19 0805  TempSrc: Oral  PainSc:                  Buckner [place]

## 2019-01-15 NOTE — Progress Notes (Signed)
Physical Therapy Treatment Patient Details Name: Cathy Spears MRN: [id number] DOB: May 15, 1936 Today's Date: 01/15/2019    History of Present Illness Pt admitted for R TKR . Pt allowed daughter as approved visitor.  Pt transferred to telemetry d/t HR in 150's 01/14/19.    PT Comments    [place] interpretor on phone initially interpreting and when pt's daughter returned to pt's room, the [place] interpretor stayed on the phone while daughter interpreted.  Polar care noted to be warm: only ice noted in polar care so water added and polar care then noted to be cool to touch.  Pt min assist for R LE semi-supine to/from sit.  Minimal assist to stand from bed with vc's for UE/LE placement.  Able to progress to ambulating 60 feet with RW CGA (vc's required for positioning within walker and increasing UE support through RW).  HR 60's to 70's bpm during ambulation.  O2 sats decreased to 89% post ambulation but returned to 92% on room air with sitting rest.  Pt noted to have O2 decrease to 83-84% laying down in bed though: nurse notified and pt placed on 2 L O2 via nasal cannula per discussion with pt's nurse and O2 increased to 94%.  Telemetry nurse requesting assist setting up CPM.  CPM adjusted for pt's fit and pt able to tolerate up to 45 degrees R knee flexion.  After finishing setting pt up pt then reporting discomfort to R inner thigh.  CPM stopped and removed to adjust thigh setting and then pt re-set up in CPM and pt reporting improved comfort.  Pt/pt's daughter educated on CPM use, precautions, and how to stop CPM if needed.  Will continue to focus on strengthening, knee ROM, and progressive ambulation per pt tolerance next session.   Follow Up Recommendations  Home health PT;Supervision/Assistance - 24 hour     Equipment Recommendations  Rolling walker with 5" wheels;3in1 (PT)    Recommendations for Other Services       Precautions / Restrictions Precautions Precautions:  Fall;Knee Precaution Booklet Issued: No Restrictions Weight Bearing Restrictions: Yes RLE Weight Bearing: Weight bearing as tolerated    Mobility  Bed Mobility Overal bed mobility: Needs Assistance Bed Mobility: Supine to Sit;Sit to Supine     Supine to sit: Min assist Sit to supine: Min assist   General bed mobility comments: assist for R LE; increased effort to perform; use of bedrail  Transfers Overall transfer level: Needs assistance Equipment used: Rolling walker (2 wheeled) Transfers: Sit to/from Stand Sit to Stand: Min assist         General transfer comment: vc's for UE/LE placement; assist to initiate and come to full stand from bed  Ambulation/Gait Ambulation/Gait assistance: Min guard Gait Distance (Feet): 60 Feet Assistive device: Rolling walker (2 wheeled) Gait Pattern/deviations: Step-to pattern;Decreased stance time - right;Antalgic Gait velocity: decreased   General Gait Details: vc's to increase UE support through RW and for appropriate positioning within RW   Stairs             Wheelchair Mobility    Modified Rankin (Stroke Patients Only)       Balance Overall balance assessment: Needs assistance Sitting-balance support: Feet supported;No upper extremity supported Sitting balance-Leahy Scale: Good Sitting balance - Comments: steady sitting reaching within BOS   Standing balance support: Single extremity supported Standing balance-Leahy Scale: Poor Standing balance comment: pt requiring at least single UE support for static standing balance  Cognition Arousal/Alertness: Awake/alert Behavior During Therapy: WFL for tasks assessed/performed Overall Cognitive Status: Within Functional Limits for tasks assessed                                        Exercises CPM set 10 degrees to 45 degrees R knee per discussion with pt's nurse and per pt's comfort.    General Comments  Pt  agreeable to PT session. [place] interpretor [employer] EP#[id number] present on phone during session.      Pertinent Vitals/Pain Pain Assessment: Faces Faces Pain Scale: Hurts a little bit Pain Location: R knee Pain Descriptors / Indicators: Discomfort Pain Intervention(s): Limited activity within patient's tolerance;Monitored during session;Premedicated before session;Repositioned;Other (comment)(polar care applied and activated)    Home Living                      Prior Function            PT Goals (current goals can now be found in the care plan section) Acute Rehab PT Goals Patient Stated Goal: to get stronger PT Goal Formulation: With patient Time For Goal Achievement: 01/28/19 Potential to Achieve Goals: Good Additional Goals Additional Goal #1: Pt will perform stair training x 5 steps with R railing to safely enter/exit home with cga Additional Goal #2: Pt will be able to perform bed mobility/transfers with supervision and RW to improve functional independence Progress towards PT goals: Progressing toward goals    Frequency    BID      PT Plan Current plan remains appropriate    Co-evaluation              AM-PAC PT "6 Clicks" Mobility   Outcome Measure  Help needed turning from your back to your side while in a flat bed without using bedrails?: A Little Help needed moving from lying on your back to sitting on the side of a flat bed without using bedrails?: A Little Help needed moving to and from a bed to a chair (including a wheelchair)?: A Little Help needed standing up from a chair using your arms (e.g., wheelchair or bedside chair)?: A Little Help needed to walk in hospital room?: A Little Help needed climbing 3-5 steps with a railing? : A Lot 6 Click Score: 17    End of Session Equipment Utilized During Treatment: Gait belt Activity Tolerance: Patient limited by pain Patient left: in bed;with call bell/phone within reach;with bed alarm set;with  family/visitor present;with SCD's reapplied(R LE in CPM; L heel elevated via towel roll) Nurse Communication: Mobility status;Precautions(Pt's O2 sats) PT Visit Diagnosis: Muscle weakness (generalized) (M62.81);Difficulty in walking, not elsewhere classified (R26.2);Pain Pain - Right/Left: Right Pain - part of body: Knee     Time: 8416-6063 PT Time Calculation (min) (ACUTE ONLY): 86 min  Charges:  $Gait Training: 8-22 mins $Therapeutic Exercise: 8-22 mins $Therapeutic Activity: 38-52 mins                    Leitha Bleak, PT 01/15/19, 5:18 PM [phone]

## 2019-01-15 NOTE — Progress Notes (Signed)
  Subjective: 1 Day Post-Op Procedure(s) (LRB): TOTAL KNEE ARTHROPLASTY RIGHT (Right) Patient reports pain as 8 on 0-10 scale.   Patient is doing well today however she did require transfer to the telemetry floor due to tachycardia. Internal medicine and cardiology consulted. PT and care management to assist with discharge planning. Negative for chest pain and shortness of breath Fever: no Gastrointestinal:Negative for nausea and vomiting  Objective: Vital signs in last 24 hours: Temp:  [98 F (36.7 C)-98.6 F (37 C)] 98.6 F (37 C) (06/19 0805) Pulse Rate:  [64-153] 64 (06/19 0805) Resp:  [16-20] 20 (06/19 0805) BP: (117-138)/(55-70) 125/56 (06/19 0805) SpO2:  [90 %-97 %] 94 % (06/19 0805) Weight:  [86.9 kg] 86.9 kg (06/18 2247)  Intake/Output from previous day:  Intake/Output Summary (Last 24 hours) at 01/15/2019 1328 Last data filed at 01/15/2019 0955 Gross per 24 hour  Intake 267.65 ml  Output 3200 ml  Net -2932.35 ml    Intake/Output this shift: Total I/O In: 120 [P.O.:120] Out: 1050 [Urine:1050]  Labs: Recent Labs    01/14/19 1343 01/14/19 2058 01/15/19 0222  HGB 9.7* 11.4* 11.1*   Recent Labs    01/14/19 2058 01/15/19 0222  WBC 12.2* 11.7*  RBC 4.27 4.08  HCT 34.7* 32.8*  PLT 209 200   Recent Labs    01/14/19 2058 01/15/19 0222  NA 130* 129*  K 3.7 3.9  CL 96* 95*  CO2 23 25  BUN 9 8  CREATININE 0.46 0.40*  GLUCOSE 168* 151*  CALCIUM 8.5* 8.6*   No results for input(s): LABPT, INR in the last 72 hours.   EXAM General - Patient is Alert, Appropriate and Oriented Extremity - ABD soft Sensation intact distally Intact pulses distally Dorsiflexion/Plantar flexion intact Incision: scant drainage No cellulitis present Dressing/Incision - blood tinged drainage, very minimal Motor Function - intact, moving foot and toes well on exam.  Abdomen soft with normal BS.  Past Medical History:  Diagnosis Date  . Anxiety   . Breast cancer ([place])    . Cancer ([place])    ,bilateral lymph nodes removed  breast mostly from the left  . Cataracts, bilateral   . Complication of anesthesia   . Dysrhythmia    hx of AF  . Esophageal erosions   . GERD (gastroesophageal reflux disease)   . Hypertension    dr Saralyn Pilar  at [facility]  . Personal history of radiation therapy   . PONV (postoperative nausea and vomiting)    spinal anesthesia x1    Assessment/Plan: 1 Day Post-Op Procedure(s) (LRB): TOTAL KNEE ARTHROPLASTY RIGHT (Right) Active Problems:   Status post total knee replacement using cement, right  Estimated body mass index is 33.92 kg/m as calculated from the following:   Height as of this encounter: 5\' 3"  (1.6 m).   Weight as of this encounter: 86.9 kg. Advance diet Up with therapy D/C IV fluids when tolerating po intake.  Labs reviewed this AM.  WBC 11.7 this morning. Continue to monitor tachycardia, cardiology consulted. Up with therapy today if able, she was able to work with PT yesterday. Begin working on BM. CBC and BMP ordered for tomorrow morning.  DVT Prophylaxis - Lovenox, Foot Pumps and TED hose Weight-Bearing as tolerated to right leg  J. Cameron Proud, PA-C [facility] 01/15/2019, 1:28 PM

## 2019-01-15 NOTE — Consult Note (Signed)
[facility] NAME: Cathy Spears    MR#:  [id number]  DATE OF BIRTH:  1936/04/15  DATE OF ADMISSION:  01/14/2019  PRIMARY CARE PHYSICIAN: Rusty Aus, MD   REQUESTING/REFERRING PHYSICIAN: Milagros Evener, MD  CHIEF COMPLAINT:  No chief complaint on file.   HISTORY OF PRESENT ILLNESS:  Cathy Spears  is a 83 y.o. female with a known history of right total knee arthroscopy on 01/15/2019.  She has a history of anxiety, breast cancer, atrial fibrillation, GERD, hypertension.  Hospitalist service consulted for medical management of patient with reported tachycardia.  Patient is lying in bed with no complaints.  She denies chest pain, palpitations, shortness of breath.  She denies cough.  Patient received evening metoprolol dose while I was at her bedside.  EKG was completed demonstrating SVT with multiple T wave abnormalities.  Troponin was less than 0.03.  CTA chest demonstrated no evidence of pulmonary embolism.  Lungs appear clear.  Magnesium was decreased at 1.7.  Patient is currently receiving IV magnesium replacement.  She was transferred to telemetry unit for closer monitoring.  Cardiology, Dr.Paraschos consulted for further evaluation and recommendations.  Patient was noted to convert to sinus rhythm with heart rate in the 80s after receiving p.o. metoprolol. Postoperative pain appears to be controlled fairly well with IV analgesic.  Patient has experienced nausea which is being treated with IV antiemetic as well.  I have spoken with Dr.Poggi regarding findings and plan.  PAST MEDICAL HISTORY:   Past Medical History:  Diagnosis Date  . Anxiety   . Breast cancer ([place])   . Cancer ([place])    ,bilateral lymph nodes removed  breast mostly from the left  . Cataracts, bilateral   . Complication of anesthesia   . Dysrhythmia    hx of AF  . Esophageal erosions   . GERD (gastroesophageal reflux disease)   . Hypertension    dr  Saralyn Pilar  at [facility]  . Personal history of radiation therapy   . PONV (postoperative nausea and vomiting)    spinal anesthesia x1    PAST SURGICAL HISTOIRY:   Past Surgical History:  Procedure Laterality Date  . BALLOON DILATION N/A 09/24/2017   Procedure: BALLOON DILATION;  Surgeon: Toledo, Benay Pike, MD;  Location: [facility] ENDOSCOPY;  Service: Gastroenterology;  Laterality: N/A;  . BREAST BIOPSY Right   . BREAST LUMPECTOMY Bilateral    2010  . BREAST SURGERY     lumpectomy bil,pt. states lymph nodes were removed and  the left arm is restricted  . COLONOSCOPY WITH ESOPHAGOGASTRODUODENOSCOPY (EGD)    . ESOPHAGOGASTRODUODENOSCOPY (EGD) WITH PROPOFOL N/A 09/24/2017   Procedure: ESOPHAGOGASTRODUODENOSCOPY (EGD) WITH PROPOFOL;  Surgeon: Toledo, Benay Pike, MD;  Location: [facility] ENDOSCOPY;  Service: Gastroenterology;  Laterality: N/A;  . HERNIA REPAIR    . LUMBAR LAMINECTOMY/DECOMPRESSION MICRODISCECTOMY  08/22/2011   Procedure: LUMBAR LAMINECTOMY/DECOMPRESSION MICRODISCECTOMY;  Surgeon: Ophelia Charter, MD;  Location: [facility]UR[facility] ORS;  Service: Neurosurgery;  Laterality: Right;  RIGHT Lumbar five sacral one  laminectomy and microdiscectomy    SOCIAL HISTORY:   Social History   Tobacco Use  . Smoking status: Never Smoker  . Smokeless tobacco: Never Used  Substance Use Topics  . Alcohol use: No    FAMILY HISTORY:  History reviewed. No pertinent family history.  DRUG ALLERGIES:   Allergies  Allergen Reactions  . Fish-Derived Products Swelling    Blue Fish years ago.  Caused redness over  all of face    REVIEW OF SYSTEMS:  CONSTITUTIONAL: No fever, fatigue or weakness.  EYES: No blurred or double vision.  EARS, NOSE, AND THROAT: No tinnitus or ear pain.  RESPIRATORY: No cough, shortness of breath, wheezing or hemoptysis.  CARDIOVASCULAR: No chest pain, orthopnea, edema.  GASTROINTESTINAL: No nausea, vomiting, diarrhea or abdominal pain.  GENITOURINARY: No dysuria,  hematuria.  ENDOCRINE: No polyuria, nocturia,  HEMATOLOGY: No anemia, easy bruising or bleeding SKIN: No rash or lesion. MUSCULOSKELETAL: No joint pain or arthritis.   NEUROLOGIC: No tingling, numbness, weakness.  PSYCHIATRY: No anxiety or depression.   MEDICATIONS AT HOME:   Prior to Admission medications   Medication Sig Start Date End Date Taking? Authorizing Provider  amLODipine (NORVASC) 5 MG tablet Take 5 mg by mouth at bedtime.   Yes [provider]  Calcium Carbonate-Vitamin D (TH CALCIUM CARBONATE-VITAMIN D) 600-400 MG-UNIT tablet Take 1 tablet by mouth 2 (two) times daily.   Yes [provider]  cholecalciferol (VITAMIN D) 1000 UNITS tablet Take 1,000 Units by mouth daily.   Yes [provider]  hydrochlorothiazide (HYDRODIURIL) 12.5 MG tablet Take 12.5 mg by mouth daily.    Yes [provider]  metoprolol (LOPRESSOR) 50 MG tablet Take 50 mg by mouth 2 (two) times daily.   Yes [provider]  Multiple Vitamins-Minerals (PRESERVISION AREDS PO) Take 1 tablet by mouth 2 (two) times daily.   Yes [provider]  omeprazole (PRILOSEC) 40 MG capsule Take 40 mg by mouth 2 (two) times a day.    Yes [provider]  vitamin B-12 (CYANOCOBALAMIN) 1000 MCG tablet Take 1,000 mcg by mouth daily.   Yes [provider]      VITAL SIGNS:  Blood pressure 124/70, pulse 76, temperature 98.6 F (37 C), temperature source Oral, resp. rate 16, height 5\' 3"  (1.6 m), weight 86.9 kg, SpO2 97 %.  PHYSICAL EXAMINATION:  GENERAL:  83 y.o.-year-old patient lying in the bed with no acute distress.  EYES: Pupils equal, round, reactive to light and accommodation. No scleral icterus. Extraocular muscles intact.  HEENT: Head atraumatic, normocephalic. Oropharynx and nasopharynx clear.  NECK:  Supple, no jugular venous distention. No thyroid enlargement, no tenderness.  LUNGS: Normal breath sounds bilaterally, no wheezing, rales,rhonchi  or crepitation. No use of accessory muscles of respiration.  CARDIOVASCULAR: Tachycardia .no murmurs, rubs, or gallops.  ABDOMEN: Soft, nontender, nondistended. Bowel sounds present. No organomegaly or mass.  EXTREMITIES: Right lower extremity immobilized  NEUROLOGIC: Cranial nerves II through XII are intact. Muscle strength 5/5 in all extremities. Sensation intact. Gait not checked.  PSYCHIATRIC: The patient is alert and oriented x 3.  SKIN: No obvious rash, lesion, or ulcer.  LABORATORY PANEL:   CBC Recent Labs  Lab 01/14/19 2058  WBC 12.2*  HGB 11.4*  HCT 34.7*  PLT 209   ------------------------------------------------------------------------------------------------------------------  Chemistries  Recent Labs  Lab 01/14/19 2058  NA 130*  K 3.7  CL 96*  CO2 23  GLUCOSE 168*  BUN 9  CREATININE 0.46  CALCIUM 8.5*  MG 1.7  AST 20  ALT 16  ALKPHOS 49  BILITOT 0.9   ------------------------------------------------------------------------------------------------------------------  Cardiac Enzymes Recent Labs  Lab 01/14/19 2058  TROPONINI <0.03   ------------------------------------------------------------------------------------------------------------------  RADIOLOGY:  Dg Knee Right Port  Result Date: 01/14/2019 CLINICAL DATA:  RIGHT total knee replacement. EXAM: PORTABLE RIGHT KNEE - 1-2 VIEW COMPARISON:  None. FINDINGS: RIGHT total knee replacement changes noted. No definite complicating features. No acute fracture or  dislocation. IMPRESSION: RIGHT total knee replacement without definite complicating features. Electronically Signed   By: Margarette Canada M.D.   On: 01/14/2019 11:54    EKG:   Orders placed or performed during the hospital encounter of 01/14/19  . EKG 12-Lead  . EKG 12-Lead  . EKG 12-Lead  . EKG 12-Lead    IMPRESSION AND PLAN:   1.  Supraventricular tachycardia - Improved with p.o. metoprolol to sinus rhythm with heart rate in the 80s -  Metoprolol is continued - We will repeat EKG in the a.m. - Echocardiogram in the a.m. - Cardiology consulted for further evaluation and recommendations -Patient was transferred to telemetry unit for close monitoring  2.  Status post right total knee with postoperative pain - Pain is being managed with IV analgesic  3.  Hypomagnesemia - Patient received IV magnesium replacement -We will repeat magnesium level in the a.m. -She is on telemetry monitoring  4. Hypertension --Norvasc continued - We will treat persistent hypertension expectantly with PRN antihypertensives  We thank you for this consultation and we will continue to follow along with you during this patient's hospitalization until discharged home   All the records are reviewed and case discussed with Consulting provider. Management plans discussed with the patient, family and they are in agreement.  CODE STATUS: Full code  TOTAL TIME TAKING CARE OF THIS PATIENT: 45 minutes.    [place] on 01/15/2019 at 2:07 AM  Between 7am to 6pm - Pager - [phone]  After 6pm go to [email or web address] - password [email or web address] [facility]  [facility]  Office  [phone]  CC: Primary care Physician: Rusty Aus, MD   Note: This dictation was prepared with Dragon dictation along with smaller phrase technology. Any transcriptional errors that result from this process are unintentional.

## 2019-01-15 NOTE — Consult Note (Signed)
[facility] Cardiology  CARDIOLOGY CONSULT NOTE  Patient ID: Cathy Spears MRN: [id number] DOB/AGE: 04/19/1936 83 y.o.  Admit date: 01/14/2019 Referring Physician Gardiner Barefoot, NP Primary Physician [facility] Primary Cardiologist Paraschos Reason for Consultation SVT  HPI: 83 year old female referred for evaluation of supraventricular tachycardia which occurred status post right total knee replacement. The patient has a history of hypertension and questionable atrial fibrillation, not on chronic anticoagulation. The patient's heart rate was noted to be elevated at 153 bpm and oxygen 88% postoperatively. ECG revealed SVT at a rate of 154 bpm with ST-T wave abnormalities in multiple leads. The patient received her oral metoprolol tartrate 50 mg and converted to sinus rhythm with rates in the 60s, which has remained stable since then. The patient denied palpitations, shortness of breath, or chest pain at the time, nor has she complained of it since. She has intermittent pedal edema at baseline. Labs notable for troponin less than 0.03 x 2. Chest CTA negative for PE, cardiomegaly, mild aortic calcific atherosclerosis and coronary artery calcific atherosclerosis with clear lungs.  Review of systems complete and found to be negative unless listed above     Past Medical History:  Diagnosis Date  . Anxiety   . Breast cancer ([place])   . Cancer ([place])    ,bilateral lymph nodes removed  breast mostly from the left  . Cataracts, bilateral   . Complication of anesthesia   . Dysrhythmia    hx of AF  . Esophageal erosions   . GERD (gastroesophageal reflux disease)   . Hypertension    dr Saralyn Pilar  at [facility]  . Personal history of radiation therapy   . PONV (postoperative nausea and vomiting)    spinal anesthesia x1    Past Surgical History:  Procedure Laterality Date  . BALLOON DILATION N/A 09/24/2017   Procedure: BALLOON DILATION;  Surgeon: Toledo, Benay Pike, MD;  Location: [facility] ENDOSCOPY;   Service: Gastroenterology;  Laterality: N/A;  . BREAST BIOPSY Right   . BREAST LUMPECTOMY Bilateral    2010  . BREAST SURGERY     lumpectomy bil,pt. states lymph nodes were removed and  the left arm is restricted  . COLONOSCOPY WITH ESOPHAGOGASTRODUODENOSCOPY (EGD)    . ESOPHAGOGASTRODUODENOSCOPY (EGD) WITH PROPOFOL N/A 09/24/2017   Procedure: ESOPHAGOGASTRODUODENOSCOPY (EGD) WITH PROPOFOL;  Surgeon: Toledo, Benay Pike, MD;  Location: [facility] ENDOSCOPY;  Service: Gastroenterology;  Laterality: N/A;  . HERNIA REPAIR    . LUMBAR LAMINECTOMY/DECOMPRESSION MICRODISCECTOMY  08/22/2011   Procedure: LUMBAR LAMINECTOMY/DECOMPRESSION MICRODISCECTOMY;  Surgeon: Ophelia Charter, MD;  Location: [facility] NEURO ORS;  Service: Neurosurgery;  Laterality: Right;  RIGHT Lumbar five sacral one  laminectomy and microdiscectomy    Medications Prior to Admission  Medication Sig Dispense Refill Last Dose  . amLODipine (NORVASC) 5 MG tablet Take 5 mg by mouth at bedtime.   01/13/2019 at 2100  . Calcium Carbonate-Vitamin D (TH CALCIUM CARBONATE-VITAMIN D) 600-400 MG-UNIT tablet Take 1 tablet by mouth 2 (two) times daily.   01/13/2019 at 2100  . cholecalciferol (VITAMIN D) 1000 UNITS tablet Take 1,000 Units by mouth daily.   01/13/2019 at 0800  . hydrochlorothiazide (HYDRODIURIL) 12.5 MG tablet Take 12.5 mg by mouth daily.    01/14/2019 at 0800  . metoprolol (LOPRESSOR) 50 MG tablet Take 50 mg by mouth 2 (two) times daily.   01/14/2019 at 0445  . Multiple Vitamins-Minerals (PRESERVISION AREDS PO) Take 1 tablet by mouth 2 (two) times daily.   01/13/2019 at 2100  . omeprazole (PRILOSEC) 40  MG capsule Take 40 mg by mouth 2 (two) times a day.    01/14/2019 at 0445  . vitamin B-12 (CYANOCOBALAMIN) 1000 MCG tablet Take 1,000 mcg by mouth daily.   01/13/2019 at 0800   Social History   Socioeconomic History  . Marital status: Married    Spouse name: Not on file  . Number of children: Not on file  . Years of education: Not on file  .  Highest education level: Not on file  Occupational History  . Not on file  Social Needs  . Financial resource strain: Not on file  . Food insecurity    Worry: Not on file    Inability: Not on file  . Transportation needs    Medical: Not on file    Non-medical: Not on file  Tobacco Use  . Smoking status: Never Smoker  . Smokeless tobacco: Never Used  Substance and Sexual Activity  . Alcohol use: No  . Drug use: No  . Sexual activity: Not on file  Lifestyle  . Physical activity    Days per week: Not on file    Minutes per session: Not on file  . Stress: Not on file  Relationships  . Social Herbalist on phone: Not on file    Gets together: Not on file    Attends religious service: Not on file    Active member of club or organization: Not on file    Attends meetings of clubs or organizations: Not on file    Relationship status: Not on file  . Intimate partner violence    Fear of current or ex partner: Not on file    Emotionally abused: Not on file    Physically abused: Not on file    Forced sexual activity: Not on file  Other Topics Concern  . Not on file  Social History Narrative  . Not on file    History reviewed. No pertinent family history.    Review of systems complete and found to be negative unless listed above      PHYSICAL EXAM  General: Well developed, well nourished, in no acute distress HEENT:  Normocephalic and atramatic Neck:  No JVD.  Lungs: Clear bilaterally to auscultation, normal effort of breathing on supplemental oxygen Heart: HRRR . Normal S1 and S2 without gallops or murmurs.  Abdomen: nondistended Msk:  Back normal, gait not assessed. Extremities: No clubbing, cyanosis or edema.   Neuro: Alert and oriented X 3. Psych:  Good affect, responds appropriately  Labs:   Lab Results  Component Value Date   WBC 11.7 (H) 01/15/2019   HGB 11.1 (L) 01/15/2019   HCT 32.8 (L) 01/15/2019   MCV 80.4 01/15/2019   PLT 200 01/15/2019     Recent Labs  Lab 01/14/19 2058 01/15/19 0222  NA 130* 129*  K 3.7 3.9  CL 96* 95*  CO2 23 25  BUN 9 8  CREATININE 0.46 0.40*  CALCIUM 8.5* 8.6*  PROT 6.7  --   BILITOT 0.9  --   ALKPHOS 49  --   ALT 16  --   AST 20  --   GLUCOSE 168* 151*   Lab Results  Component Value Date   TROPONINI <0.03 01/15/2019   No results found for: CHOL No results found for: HDL No results found for: LDLCALC No results found for: TRIG No results found for: CHOLHDL No results found for: LDLDIRECT    Radiology: Ct Angio Chest Pe W Or  Wo Contrast  Result Date: 01/15/2019 CLINICAL DATA:  83 y/o F; hypoxia post right total knee arthroplasty. EXAM: CT ANGIOGRAPHY CHEST WITH CONTRAST TECHNIQUE: Multidetector CT imaging of the chest was performed using the standard protocol during bolus administration of intravenous contrast. Multiplanar CT image reconstructions and MIPs were obtained to evaluate the vascular anatomy. CONTRAST:  56mL OMNIPAQUE IOHEXOL 350 MG/ML SOLN COMPARISON:  12/18/2017 CT chest. FINDINGS: Cardiovascular: Main pulmonary artery is enlarged measuring 3.5 cm. Normal caliber thoracic aorta. Mild aortic calcific atherosclerosis and coronary artery calcific atherosclerosis. Dense mitral annular calcification. Cardiomegaly with biatrial enlargement. Mediastinum/Nodes: No enlarged mediastinal, hilar, or axillary lymph nodes. Thyroid gland, trachea, and esophagus demonstrate no significant findings. Lungs/Pleura: Lungs are clear. No pleural effusion or pneumothorax. Upper Abdomen: No acute abnormality. Musculoskeletal: No chest wall abnormality. No acute or significant osseous findings. Review of the MIP images confirms the above findings. IMPRESSION: 1. No pulmonary embolus identified. 2. Enlarged main pulmonary artery indicates pulmonary artery hypertension. 3. Cardiomegaly with biatrial enlargement. 4. Mild aortic calcific atherosclerosis and coronary artery calcific atherosclerosis. 5. Clear lungs.  Electronically Signed   By: Kristine Garbe M.D.   On: 01/15/2019 00:27   Dg Chest Port 1 View  Result Date: 01/14/2019 CLINICAL DATA:  Dyspnea. EXAM: PORTABLE CHEST 1 VIEW COMPARISON:  Radiographs 03/27/2018 FINDINGS: Mild cardiomegaly. Tortuous atherosclerotic thoracic aorta. Possible vascular congestion without pulmonary edema. Chronic blunting of the right costophrenic angle. No large effusion. No focal airspace disease or pneumothorax. Degenerative change of the shoulders. IMPRESSION: 1. Mild cardiomegaly, similar to prior allowing for differences in technique. 2. Possible vascular congestion. Electronically Signed   By: Keith Rake M.D.   On: 01/14/2019 21:14   Dg Knee Right Port  Result Date: 01/14/2019 CLINICAL DATA:  RIGHT total knee replacement. EXAM: PORTABLE RIGHT KNEE - 1-2 VIEW COMPARISON:  None. FINDINGS: RIGHT total knee replacement changes noted. No definite complicating features. No acute fracture or dislocation. IMPRESSION: RIGHT total knee replacement without definite complicating features. Electronically Signed   By: Margarette Canada M.D.   On: 01/14/2019 11:54    EKG: NSR 61 bpm  ASSESSMENT AND PLAN:  83 year old female status post total knee replacement on 01/14/2019, heart rate noted to be elevated and hypoxic with ECG revealing SVT at a rate of 154 bpm with multiple ST-T wave abnormalities. Troponin negative x 2. Chest CTA negative for PE. Patient denied chest pain, palpitations, or shortness of breath. She received scheduled metoprolol tartrate 50 mg with conversion to sinus rhythm.  Plan: 1. Continued scheduled metoprolol tartrate 50 mg BID 2. 2D echocardiogram scheduled 3. Repeat ECG this morning 4. If patient remains stable today, consider transferring to ortho unit with continued telemetry monitoring 5. Further recommendations pending ECG and patient's initial course.  Signed: Clabe Seal PA-C 01/15/2019, 8:13 AM

## 2019-01-15 NOTE — Progress Notes (Signed)
[place] at [facility]                                                                                                                                                                                  Patient Demographics   Cathy Spears, is a 83 y.o. female, DOB - 1936-06-07, AST:[id number]  Admit date - 01/14/2019   Admitting Physician Corky Mull, MD  Outpatient Primary MD for the patient is Rusty Aus, MD   LOS - 1  Subjective: Patient doing better no further ventricular tachycardia is noted Patient has had some nausea   Review of Systems:   CONSTITUTIONAL: No documented fever. No fatigue, weakness. No weight gain, no weight loss.  EYES: No blurry or double vision.  ENT: No tinnitus. No postnasal drip. No redness of the oropharynx.  RESPIRATORY: No cough, no wheeze, no hemoptysis. No dyspnea.  CARDIOVASCULAR: No chest pain. No orthopnea. No palpitations. No syncope.  GASTROINTESTINAL: Positive nausea, no vomiting or diarrhea. No abdominal pain. No melena or hematochezia.  GENITOURINARY: No dysuria or hematuria.  ENDOCRINE: No polyuria or nocturia. No heat or cold intolerance.  HEMATOLOGY: No anemia. No bruising. No bleeding.  INTEGUMENTARY: No rashes. No lesions.  MUSCULOSKELETAL: No arthritis. No swelling. No gout.  NEUROLOGIC: No numbness, tingling, or ataxia. No seizure-type activity.  PSYCHIATRIC: No anxiety. No insomnia. No ADD.    Vitals:   Vitals:   01/14/19 2247 01/14/19 2254 01/15/19 0349 01/15/19 0805  BP:  124/70 138/62 (!) 125/56  Pulse:  76 70 64  Resp:  16  20  Temp:  98.6 F (37 C) 98.4 F (36.9 C) 98.6 F (37 C)  TempSrc:  Oral Oral Oral  SpO2:  97% 93% 94%  Weight: 86.9 kg     Height: 5\' 3"  (1.6 m)       Wt Readings from Last 3 Encounters:  01/14/19 86.9 kg  01/11/19 86.6 kg  03/27/18 86.6 kg     Intake/Output Summary (Last 24 hours) at 01/15/2019 1228 Last data filed at 01/15/2019 0955 Gross per 24 hour   Intake 267.65 ml  Output 3200 ml  Net -2932.35 ml    Physical Exam:   GENERAL: Pleasant-appearing in no apparent distress.  HEAD, EYES, EARS, NOSE AND THROAT: Atraumatic, normocephalic. Extraocular muscles are intact. Pupils equal and reactive to light. Sclerae anicteric. No conjunctival injection. No oro-pharyngeal erythema.  NECK: Supple. There is no jugular venous distention. No bruits, no lymphadenopathy, no thyromegaly.  HEART: Regular rate and rhythm,. No murmurs, no rubs, no clicks.  LUNGS: Clear to auscultation bilaterally. No rales or rhonchi. No wheezes.  ABDOMEN: Soft, flat, nontender, nondistended. Has good  bowel sounds. No hepatosplenomegaly appreciated.  EXTREMITIES: No evidence of any cyanosis, clubbing, or peripheral edema.  +2 pedal and radial pulses bilaterally.  NEUROLOGIC: The patient is alert, awake, and oriented x3 with no focal motor or sensory deficits appreciated bilaterally.  SKIN: Moist and warm with no rashes appreciated.  Psych: Not anxious, depressed LN: No inguinal LN enlargement    Antibiotics   Anti-infectives (From admission, onward)   Start     Dose/Rate Route Frequency Ordered Stop   01/14/19 1400  ceFAZolin (ANCEF) IVPB 2g/100 mL premix     2 g 200 mL/hr over 30 Minutes Intravenous Every 6 hours 01/14/19 1232 01/15/19 0439   01/14/19 0631  ceFAZolin (ANCEF) 2-4 GM/100ML-% IVPB    Note to Pharmacy: Cleatis Polka   : cabinet override      01/14/19 0631 01/14/19 0745   01/14/19 0100  ceFAZolin (ANCEF) IVPB 2g/100 mL premix     2 g 200 mL/hr over 30 Minutes Intravenous  Once 01/14/19 0052 01/14/19 0745      Medications   Scheduled Meds: . acetaminophen  1,000 mg Oral Q6H  . amLODipine  5 mg Oral QHS  . calcium-vitamin D  1 tablet Oral BID  . cholecalciferol  1,000 Units Oral Daily  . docusate sodium  100 mg Oral BID  . enoxaparin (LOVENOX) injection  40 mg Subcutaneous Q24H  . hydrochlorothiazide  12.5 mg Oral Daily  . metoprolol  tartrate  50 mg Oral BID  . multivitamin-lutein  1 capsule Oral BID  . pantoprazole  40 mg Oral Daily  . vitamin B-12  1,000 mcg Oral Daily   Continuous Infusions: . sodium chloride 75 mL/hr at 01/15/19 0046   PRN Meds:.acetaminophen, bisacodyl, diphenhydrAMINE, HYDROmorphone (DILAUDID) injection, magnesium hydroxide, metoCLOPramide **OR** metoCLOPramide (REGLAN) injection, ondansetron **OR** ondansetron (ZOFRAN) IV, oxyCODONE, sodium phosphate, traMADol   Data Review:   Micro Results Recent Results (from the past 240 hour(s))  Urine culture     Status: Abnormal   Collection Time: 01/11/19  3:01 PM   Specimen: Urine, Random  Result Value Ref Range Status   Specimen Description   Final    URINE, RANDOM Performed at [facility], [address] [place], [place] [address]    Special Requests   Final    NONE Performed at [facility], [address] [place], [place] [address]    Culture 30,000 COLONIES/mL ESCHERICHIA COLI (A)  Final   Report Status 01/13/2019 FINAL  Final   Organism ID, Bacteria ESCHERICHIA COLI (A)  Final      Susceptibility   Escherichia coli - MIC*    AMPICILLIN <=2 SENSITIVE Sensitive     CEFAZOLIN <=4 SENSITIVE Sensitive     CEFTRIAXONE <=1 SENSITIVE Sensitive     CIPROFLOXACIN <=0.25 SENSITIVE Sensitive     GENTAMICIN <=1 SENSITIVE Sensitive     IMIPENEM <=0.25 SENSITIVE Sensitive     NITROFURANTOIN <=16 SENSITIVE Sensitive     TRIMETH/SULFA <=20 SENSITIVE Sensitive     AMPICILLIN/SULBACTAM <=2 SENSITIVE Sensitive     PIP/TAZO <=4 SENSITIVE Sensitive     Extended ESBL NEGATIVE Sensitive     * 30,000 COLONIES/mL ESCHERICHIA COLI  Surgical pcr screen     Status: None   Collection Time: 01/11/19  3:01 PM   Specimen: Nasal Mucosa; Nasal Swab  Result Value Ref Range Status   MRSA, PCR NEGATIVE NEGATIVE Final   Staphylococcus aureus NEGATIVE NEGATIVE Final    Comment: (NOTE) The Xpert SA Assay (FDA approved for  NASAL specimens in  patients 40 years of age and older), is one component of a comprehensive surveillance program. It is not intended to diagnose infection nor to guide or monitor treatment. Performed at [facility], [address] [place], [place] [address]   Novel Coronavirus, NAA (hospital order; send-out to ref lab)     Status: None   Collection Time: 01/11/19  3:19 PM   Specimen: Nasopharyngeal Swab; Respiratory  Result Value Ref Range Status   SARS-CoV-2, NAA NOT DETECTED NOT DETECTED Final    Comment: (NOTE) Testing was performed using the cobas(R) SARS-CoV-2 test. This test was developed and its performance characteristics determined by [employer]. This test has not been FDA cleared or approved. This test has been authorized by FDA under an Emergency Use Authorization (EUA). This test is only authorized for the duration of time the declaration that circumstances exist justifying the authorization of the emergency use of in vitro diagnostic tests for detection of SARS-CoV-2 virus and/or diagnosis of COVID-19 infection under section 564(b)(1) of the Act, 21 U.S.C. 177LTJ-0(Z)(0), unless the authorization is terminated or revoked sooner. When diagnostic testing is negative, the possibility of a false negative result should be considered in the context of a patient's recent exposures and the presence of clinical signs and symptoms consistent with COVID-19. An individual without symptoms of COVID-19 and who is not shedding SARS-CoV-2 virus would expect to have  a negative (not detected) result in this assay. Performed At: [facility] [address] [place], [place] [id number] Rush Farmer MD AU:[id number]    Shaniko  Final    Comment: Performed at [facility], [address] [place], [place] [address]    Radiology Reports Ct Angio Chest Pe W Or Wo Contrast  Result Date: 01/15/2019 CLINICAL DATA:  83 y/o F; hypoxia post  right total knee arthroplasty. EXAM: CT ANGIOGRAPHY CHEST WITH CONTRAST TECHNIQUE: Multidetector CT imaging of the chest was performed using the standard protocol during bolus administration of intravenous contrast. Multiplanar CT image reconstructions and MIPs were obtained to evaluate the vascular anatomy. CONTRAST:  75mL OMNIPAQUE IOHEXOL 350 MG/ML SOLN COMPARISON:  12/18/2017 CT chest. FINDINGS: Cardiovascular: Main pulmonary artery is enlarged measuring 3.5 cm. Normal caliber thoracic aorta. Mild aortic calcific atherosclerosis and coronary artery calcific atherosclerosis. Dense mitral annular calcification. Cardiomegaly with biatrial enlargement. Mediastinum/Nodes: No enlarged mediastinal, hilar, or axillary lymph nodes. Thyroid gland, trachea, and esophagus demonstrate no significant findings. Lungs/Pleura: Lungs are clear. No pleural effusion or pneumothorax. Upper Abdomen: No acute abnormality. Musculoskeletal: No chest wall abnormality. No acute or significant osseous findings. Review of the MIP images confirms the above findings. IMPRESSION: 1. No pulmonary embolus identified. 2. Enlarged main pulmonary artery indicates pulmonary artery hypertension. 3. Cardiomegaly with biatrial enlargement. 4. Mild aortic calcific atherosclerosis and coronary artery calcific atherosclerosis. 5. Clear lungs. Electronically Signed   By: Kristine Garbe M.D.   On: 01/15/2019 00:27   Dg Chest Port 1 View  Result Date: 01/14/2019 CLINICAL DATA:  Dyspnea. EXAM: PORTABLE CHEST 1 VIEW COMPARISON:  Radiographs 03/27/2018 FINDINGS: Mild cardiomegaly. Tortuous atherosclerotic thoracic aorta. Possible vascular congestion without pulmonary edema. Chronic blunting of the right costophrenic angle. No large effusion. No focal airspace disease or pneumothorax. Degenerative change of the shoulders. IMPRESSION: 1. Mild cardiomegaly, similar to prior allowing for differences in technique. 2. Possible vascular congestion.  Electronically Signed   By: Keith Rake M.D.   On: 01/14/2019 21:14   Dg Knee Right Port  Result Date: 01/14/2019  CLINICAL DATA:  RIGHT total knee replacement. EXAM: PORTABLE RIGHT KNEE - 1-2 VIEW COMPARISON:  None. FINDINGS: RIGHT total knee replacement changes noted. No definite complicating features. No acute fracture or dislocation. IMPRESSION: RIGHT total knee replacement without definite complicating features. Electronically Signed   By: Margarette Canada M.D.   On: 01/14/2019 11:54     CBC Recent Labs  Lab 01/14/19 1343 01/14/19 2058 01/15/19 0222  WBC 12.7* 12.2* 11.7*  HGB 9.7* 11.4* 11.1*  HCT 29.8* 34.7* 32.8*  PLT 181 209 200  MCV 83.5 81.3 80.4  MCH 27.2 26.7 27.2  MCHC 32.6 32.9 33.8  RDW 14.9 14.7 14.6  LYMPHSABS  --   --  0.6*  MONOABS  --   --  0.6  EOSABS  --   --  0.0  BASOSABS  --   --  0.0    Chemistries  Recent Labs  Lab 01/14/19 2058 01/15/19 0222  NA 130* 129*  K 3.7 3.9  CL 96* 95*  CO2 23 25  GLUCOSE 168* 151*  BUN 9 8  CREATININE 0.46 0.40*  CALCIUM 8.5* 8.6*  MG 1.7  --   AST 20  --   ALT 16  --   ALKPHOS 49  --   BILITOT 0.9  --    ------------------------------------------------------------------------------------------------------------------ estimated creatinine clearance is 57.6 mL/min (A) (by C-G formula based on SCr of 0.4 mg/dL (L)). ------------------------------------------------------------------------------------------------------------------ No results for input(s): HGBA1C in the last 72 hours. ------------------------------------------------------------------------------------------------------------------ No results for input(s): CHOL, HDL, LDLCALC, TRIG, CHOLHDL, LDLDIRECT in the last 72 hours. ------------------------------------------------------------------------------------------------------------------ No results for input(s): TSH, T4TOTAL, T3FREE, THYROIDAB in the last 72 hours.  Invalid input(s):  FREET3 ------------------------------------------------------------------------------------------------------------------ No results for input(s): VITAMINB12, FOLATE, FERRITIN, TIBC, IRON, RETICCTPCT in the last 72 hours.  Coagulation profile No results for input(s): INR, PROTIME in the last 168 hours.  No results for input(s): DDIMER in the last 72 hours.  Cardiac Enzymes Recent Labs  Lab 01/14/19 2058 01/15/19 0222 01/15/19 0835  TROPONINI <0.03 <0.03 <0.03   ------------------------------------------------------------------------------------------------------------------ Invalid input(s): POCBNP    Assessment & Plan   1.  Supraventricular tachycardia -  Continue metoprolol   2.  Status post right total knee with postoperative pain - Pain is being managed with IV analgesic --Treatment per orthopedics  3.  Hypomagnesemia -  Was borderline low at 1.7 --We will recheck tomorrow morning   4. Hypertension --Continue Norvasc blood pressure stable  5.  Hyponatremia continue IV normal saline follow sodium level I will discontinue HCTZ   6.  GERD continue PPI      Code Status Orders  (From admission, onward)         Start     Ordered   01/14/19 1233  Full code  Continuous     01/14/19 1233        Code Status History    This patient has a current code status but no historical code status.   Advance Care Planning Activity    Advance Directive Documentation     Most Recent Value  Type of Advance Directive  Healthcare Power of Attorney, Living will  Pre-existing out of facility DNR order (yellow form or pink MOST form)  -  "MOST" Form in Place?  -              DVT Prophylaxis  Lovenox  Lab Results  Component Value Date   PLT 200 01/15/2019     Time Spent in minutes   14min Greater than  50% of time spent in care coordination and counseling patient regarding the condition and plan of care.   Dustin Flock M.D on 01/15/2019 at 12:28  PM  Between 7am to 6pm - Pager - [phone]  After 6pm go to [email or web address] - Proofreader  [employer]   Office  [phone]

## 2019-01-15 NOTE — Progress Notes (Signed)
Physical Therapy Treatment Patient Details Name: Cathy Spears MRN: [id number] DOB: [birth year] Today's Date: 01/15/2019    History of Present Illness Pt admitted for R TKR . Pt allowed daughter as approved visitor.  Pt transferred to telemetry d/t HR in 150's 01/14/19.    PT Comments    Pt's daughter interpreting for pt but [place] phone interpreter on as well.  R knee flexion AROM to 75 degrees.  Pt requiring min assist semi-supine to sit, to stand up to RW, and to walk a few feet bed to recliner with RW; limited distance ambulating d/t R knee pain increasing to 8/10 with activity (decreased to 3-4/10 at rest end of session).  HR 73-90 bpm during session's activities.  Polar care noted to feel warm (just recently filled with ice) and was set on warmest setting so coldness increased with permission (d/t pt's reports of pain) and daughter educated on how to adjust temperature if pt thought it felt too cold.  Will continue to focus on strengthening, knee ROM, and progressive functional mobility per pt tolerance.   Follow Up Recommendations  Home health PT;Supervision/Assistance - 24 hour     Equipment Recommendations  Rolling walker with 5" wheels;3in1 (PT)    Recommendations for Other Services       Precautions / Restrictions Precautions Precautions: Fall;Knee Precaution Booklet Issued: No Restrictions Weight Bearing Restrictions: Yes RLE Weight Bearing: Weight bearing as tolerated    Mobility  Bed Mobility Overal bed mobility: Needs Assistance Bed Mobility: Supine to Sit     Supine to sit: Min assist     General bed mobility comments: assist for R LE; increased effort to perform; use of bedrail  Transfers Overall transfer level: Needs assistance Equipment used: Rolling walker (2 wheeled) Transfers: Sit to/from Stand Sit to Stand: Min assist         General transfer comment: vc's for UE/LE placement; assist to initiate and come to full  stand  Ambulation/Gait Ambulation/Gait assistance: Min guard;Min assist Gait Distance (Feet): 3 Feet(bed to recliner) Assistive device: Rolling walker (2 wheeled) Gait Pattern/deviations: Step-to pattern;Decreased stance time - right Gait velocity: decreased   General Gait Details: vc's to increase UE support through RW and to stay closer to [employer]    Modified Rankin (Stroke Patients Only)       Balance Overall balance assessment: Needs assistance Sitting-balance support: Feet supported;Bilateral upper extremity supported Sitting balance-Leahy Scale: Fair Sitting balance - Comments: static sitting   Standing balance support: Bilateral upper extremity supported Standing balance-Leahy Scale: Poor Standing balance comment: requiring B UE support on RW for static standing balance                            Cognition Arousal/Alertness: Awake/alert Behavior During Therapy: WFL for tasks assessed/performed Overall Cognitive Status: Within Functional Limits for tasks assessed                                        Exercises Total Joint Exercises Ankle Circles/Pumps: AROM;Strengthening;Both;10 reps;Supine Quad Sets: AROM;Strengthening;Both;10 reps;Supine Short Arc Quad: AAROM;Strengthening;Right;10 reps;Supine Heel Slides: AAROM;Strengthening;Right;10 reps;Supine Hip ABduction/ADduction: AAROM;Strengthening;Right;10 reps;Supine Straight Leg Raises: AAROM;Strengthening;Right;10 reps;Supine Goniometric ROM: R knee extension 12 degrees short of neutral semi-supine in bed; R knee flexion AROM to 75 degrees sitting in  recliner    General Comments   Nursing cleared pt for participation in physical therapy.  Pt agreeable to PT session.  Pt's daughter present entire session.  [place] phone interpreter on during session Abbe [place] with ID # [phone]) but pt's daughter preferring to interpret for pt.      Pertinent  Vitals/Pain Pain Assessment: 0-10 Pain Score: 4  Pain Location: R knee Pain Descriptors / Indicators: Operative site guarding;Guarding;Grimacing Pain Intervention(s): Limited activity within patient's tolerance;Monitored during session;Premedicated before session;Repositioned(polar care applied and activated)  Vitals (HR and O2 on room air) stable and WFL throughout treatment session.    Home Living                      Prior Function            PT Goals (current goals can now be found in the care plan section) Acute Rehab PT Goals Patient Stated Goal: to get stronger PT Goal Formulation: With patient Time For Goal Achievement: 01/28/19 Potential to Achieve Goals: Good Additional Goals Additional Goal #1: Pt will perform stair training x 5 steps with R railing to safely enter/exit home with cga Additional Goal #2: Pt will be able to perform bed mobility/transfers with supervision and RW to improve functional independence Progress towards PT goals: Progressing toward goals    Frequency    BID      PT Plan Current plan remains appropriate    Co-evaluation              AM-PAC PT "6 Clicks" Mobility   Outcome Measure  Help needed turning from your back to your side while in a flat bed without using bedrails?: A Little Help needed moving from lying on your back to sitting on the side of a flat bed without using bedrails?: A Little Help needed moving to and from a bed to a chair (including a wheelchair)?: A Little Help needed standing up from a chair using your arms (e.g., wheelchair or bedside chair)?: A Little Help needed to walk in hospital room?: A Little Help needed climbing 3-5 steps with a railing? : A Lot 6 Click Score: 17    End of Session Equipment Utilized During Treatment: Gait belt Activity Tolerance: Patient limited by pain Patient left: in chair;with chair alarm set;with SCD's reapplied;with family/visitor present;with call bell/phone within  reach(B heels elevated via towel rolls) Nurse Communication: Mobility status PT Visit Diagnosis: Muscle weakness (generalized) (M62.81);Difficulty in walking, not elsewhere classified (R26.2);Pain Pain - Right/Left: Right Pain - part of body: Knee     Time: 7341-9379 PT Time Calculation (min) (ACUTE ONLY): 51 min  Charges:  $Gait Training: 8-22 mins $Therapeutic Exercise: 8-22 mins $Therapeutic Activity: 8-22 mins                     Leitha Bleak, PT 01/15/19, 2:14 PM [phone]

## 2019-01-15 NOTE — Progress Notes (Signed)
*  PRELIMINARY RESULTS* Echocardiogram 2D Echocardiogram has been performed.  Sherrie Sport 01/15/2019, 8:49 AM

## 2019-01-15 NOTE — Discharge Summary (Addendum)
Physician Discharge Summary  Patient ID: Cathy Spears MRN: [id number] DOB/AGE: 1936-07-10 83 y.o.  Admit date: 01/14/2019 Discharge date: 01/17/2019  Admission Diagnoses:  PRIMARY OSTEOARTHRITIS OF RIGHT KNEE  Discharge Diagnoses: Patient Active Problem List   Diagnosis Date Noted  . Status post total knee replacement using cement, right 01/14/2019    Past Medical History:  Diagnosis Date  . Anxiety   . Breast cancer ([place])   . Cancer ([place])    ,bilateral lymph nodes removed  breast mostly from the left  . Cataracts, bilateral   . Complication of anesthesia   . Dysrhythmia    hx of AF  . Esophageal erosions   . GERD (gastroesophageal reflux disease)   . Hypertension    dr Saralyn Pilar  at [facility]  . Personal history of radiation therapy   . PONV (postoperative nausea and vomiting)    spinal anesthesia x1     Transfusion: None.   Consultants (if any): Treatment Team:  Isaias Cowman, MD  Discharged Condition: Improved  Hospital Course: Cathy Spears is an 83 y.o. female who was admitted 01/14/2019 with a diagnosis of primary osteoarthritis of the right knee and went to the operating room on 01/14/2019 and underwent the above named procedures.    Surgeries: Procedure(s): TOTAL KNEE ARTHROPLASTY RIGHT on 01/14/2019 Patient tolerated the surgery well. Taken to PACU where she was stabilized and then transferred to the orthopedic floor.  Started on Lovenox 40mg  q 24 hrs. Foot pumps applied bilaterally at 80 mm. Heels elevated on bed with rolled towels. No evidence of DVT. Negative Homan. Physical therapy started on day #1 for gait training and transfer. OT started day #1 for ADL and assisted devices.  On POD1 she developed hypoxia and HR up to 150.  CTA was performed without signs of PE, EKG obtained and cardiology and internal medicine were consulted.  2D Echo performed and current plan as of 01/15/19 was to transfer back down to the Ortho floor on 01/15/19  pending continued progress and monitoring.  The patient's heart rate became more stable around 64.  She had Cardizem added.  The patient was still working on a bowel movement.  Patient's IV was removed on POD3, Foley removed on POD1.  Implants: Right TKA using all-cemented Biomet Vanguard system with a 67.5 mm mm PCR femur, a 75 mm tibial tray with a 10 mm anterior stabilized E-poly insert, and a 31 x 8 mm all-poly 3-pegged domed patella.  She was given perioperative antibiotics:  Anti-infectives (From admission, onward)   Start     Dose/Rate Route Frequency Ordered Stop   01/14/19 1400  ceFAZolin (ANCEF) IVPB 2g/100 mL premix     2 g 200 mL/hr over 30 Minutes Intravenous Every 6 hours 01/14/19 1232 01/15/19 0439   01/14/19 0631  ceFAZolin (ANCEF) 2-4 GM/100ML-% IVPB    Note to Pharmacy: Cleatis Polka   : cabinet override      01/14/19 0631 01/14/19 0745   01/14/19 0100  ceFAZolin (ANCEF) IVPB 2g/100 mL premix     2 g 200 mL/hr over 30 Minutes Intravenous  Once 01/14/19 0052 01/14/19 0745    .  She was given sequential compression devices, early ambulation, and Lovenox for DVT prophylaxis.  She benefited maximally from the hospital stay and there were no complications.    Recent vital signs:  Vitals:   01/17/19 0404 01/17/19 0821  BP: (!) 105/51 (!) 122/55  Pulse: 64 70  Resp: 18 19  Temp: 98.5 F (  36.9 C)   SpO2: 93% 90%    Recent laboratory studies:  Lab Results  Component Value Date   HGB 11.0 (L) 01/16/2019   HGB 11.1 (L) 01/15/2019   HGB 11.4 (L) 01/14/2019   Lab Results  Component Value Date   WBC 11.7 (H) 01/16/2019   PLT 217 01/16/2019   No results found for: INR Lab Results  Component Value Date   NA 130 (L) 01/17/2019   K 3.4 (L) 01/17/2019   CL 95 (L) 01/17/2019   CO2 25 01/17/2019   BUN 13 01/17/2019   CREATININE 0.51 01/17/2019   GLUCOSE 110 (H) 01/17/2019    Discharge Medications:   Allergies as of 01/17/2019      Reactions   Fish-derived  Products Swelling   Blue Fish years ago.  Caused redness over all of face      Medication List    STOP taking these medications   amLODipine 5 MG tablet Commonly known as: NORVASC   hydrochlorothiazide 12.5 MG tablet Commonly known as: HYDRODIURIL     TAKE these medications   apixaban 2.5 MG Tabs tablet Commonly known as: ELIQUIS Take 1 tablet (2.5 mg total) by mouth 2 (two) times daily.   cholecalciferol 1000 units tablet Commonly known as: VITAMIN D Take 1,000 Units by mouth daily.   diltiazem 120 MG 24 hr capsule Commonly known as: CARDIZEM CD Take 1 capsule (120 mg total) by mouth daily.   metoprolol tartrate 50 MG tablet Commonly known as: LOPRESSOR Take 50 mg by mouth 2 (two) times daily.   omeprazole 40 MG capsule Commonly known as: PRILOSEC Take 40 mg by mouth 2 (two) times a day.   ondansetron 4 MG tablet Commonly known as: ZOFRAN Take 1 tablet (4 mg total) by mouth every 6 (six) hours as needed for nausea.   oxyCODONE 5 MG immediate release tablet Commonly known as: Oxy IR/ROXICODONE Take 1-2 tablets (5-10 mg total) by mouth every 4 (four) hours as needed for moderate pain (pain score 4-6).   PRESERVISION AREDS PO Take 1 tablet by mouth 2 (two) times daily.   TH Calcium Carbonate-Vitamin D 600-400 MG-UNIT tablet Generic drug: Calcium Carbonate-Vitamin D Take 1 tablet by mouth 2 (two) times daily.   traMADol 50 MG tablet Commonly known as: ULTRAM Take 1 tablet (50 mg total) by mouth every 6 (six) hours as needed for moderate pain.   vitamin B-12 1000 MCG tablet Commonly known as: CYANOCOBALAMIN Take 1,000 mcg by mouth daily.            Durable Medical Equipment  (From admission, onward)         Start     Ordered   01/14/19 1233  DME Bedside commode  Once    Question:  Patient needs a bedside commode to treat with the following condition  Answer:  Status post total knee replacement using cement, right   01/14/19 1232   01/14/19 1233  DME  3 n 1  Once     01/14/19 1232   01/14/19 1233  DME Walker rolling  Once    Question:  Patient needs a walker to treat with the following condition  Answer:  Status post total knee replacement using cement, right   01/14/19 1232          Diagnostic Studies: Dg Chest 2 View  Result Date: 01/16/2019 CLINICAL DATA:  Fever.  Status post knee surgery 2 days ago. EXAM: CHEST - 2 VIEW COMPARISON:  01/14/2019 and prior  radiographs FINDINGS: Cardiomegaly and mild peribronchial thickening again noted. There is no evidence of focal airspace disease, pulmonary edema, suspicious pulmonary nodule/mass, pleural effusion, or pneumothorax. No acute bony abnormalities are identified. IMPRESSION: Cardiomegaly without evidence of acute cardiopulmonary disease. Electronically Signed   By: Margarette Canada M.D.   On: 01/16/2019 16:17   Ct Angio Chest Pe W Or Wo Contrast  Result Date: 01/15/2019 CLINICAL DATA:  83 y/o F; hypoxia post right total knee arthroplasty. EXAM: CT ANGIOGRAPHY CHEST WITH CONTRAST TECHNIQUE: Multidetector CT imaging of the chest was performed using the standard protocol during bolus administration of intravenous contrast. Multiplanar CT image reconstructions and MIPs were obtained to evaluate the vascular anatomy. CONTRAST:  69mL OMNIPAQUE IOHEXOL 350 MG/ML SOLN COMPARISON:  12/18/2017 CT chest. FINDINGS: Cardiovascular: Main pulmonary artery is enlarged measuring 3.5 cm. Normal caliber thoracic aorta. Mild aortic calcific atherosclerosis and coronary artery calcific atherosclerosis. Dense mitral annular calcification. Cardiomegaly with biatrial enlargement. Mediastinum/Nodes: No enlarged mediastinal, hilar, or axillary lymph nodes. Thyroid gland, trachea, and esophagus demonstrate no significant findings. Lungs/Pleura: Lungs are clear. No pleural effusion or pneumothorax. Upper Abdomen: No acute abnormality. Musculoskeletal: No chest wall abnormality. No acute or significant osseous findings. Review of  the MIP images confirms the above findings. IMPRESSION: 1. No pulmonary embolus identified. 2. Enlarged main pulmonary artery indicates pulmonary artery hypertension. 3. Cardiomegaly with biatrial enlargement. 4. Mild aortic calcific atherosclerosis and coronary artery calcific atherosclerosis. 5. Clear lungs. Electronically Signed   By: Kristine Garbe M.D.   On: 01/15/2019 00:27   Dg Chest Port 1 View  Result Date: 01/14/2019 CLINICAL DATA:  Dyspnea. EXAM: PORTABLE CHEST 1 VIEW COMPARISON:  Radiographs 03/27/2018 FINDINGS: Mild cardiomegaly. Tortuous atherosclerotic thoracic aorta. Possible vascular congestion without pulmonary edema. Chronic blunting of the right costophrenic angle. No large effusion. No focal airspace disease or pneumothorax. Degenerative change of the shoulders. IMPRESSION: 1. Mild cardiomegaly, similar to prior allowing for differences in technique. 2. Possible vascular congestion. Electronically Signed   By: Keith Rake M.D.   On: 01/14/2019 21:14   Dg Knee Right Port  Result Date: 01/14/2019 CLINICAL DATA:  RIGHT total knee replacement. EXAM: PORTABLE RIGHT KNEE - 1-2 VIEW COMPARISON:  None. FINDINGS: RIGHT total knee replacement changes noted. No definite complicating features. No acute fracture or dislocation. IMPRESSION: RIGHT total knee replacement without definite complicating features. Electronically Signed   By: Margarette Canada M.D.   On: 01/14/2019 11:54   Disposition: Discharge disposition: 01-Home or Self Care     Plan at this time is for possible discharge home on 01/17/19 pending progress with PT and further Cardio and internal med work-up.  Follow-up Information    Lattie Corns, PA-C. Go in 2 week(s).   Specialty: Physician Assistant Contact information: [place] [address] [phone]          Signed: Joesphine Bare 01/17/2019, 7:41 AM

## 2019-01-15 NOTE — TOC Benefit Eligibility Note (Signed)
Transition of Care [facility]) Benefit Eligibility Note    Patient Details  Name: Cathy Spears MRN: [id number] Date of Birth: [birth year]   Medication/Dose: Lovenox 40mg  once daily for 14 days.  Covered?: No  Prescription Coverage Preferred Pharmacy: [facility] [place] with Person/Company/Phone Number:: Amalia Hailey with Bernadene Person at [phone]  Prior Approval: Yes(PA required for name brand: [phone])  Deductible: Unmet(Rep unable to disclose deductible amount.)  Additional Notes: Generic Enoxaparin covered with no PA required.  Estimated copay $190.12.    Dannette Barbara Phone Number: [phone] or [phone] 01/15/2019, 1:24 PM

## 2019-01-15 NOTE — Progress Notes (Signed)
Pt transferred from 1A. Pt alert and oriented. Pain at the time 5/10, but doesn't want anything for pain. Telemetry monitor applied and called to CCMD.  Pt SR in the 80"s. Will continue to monitor and assess.

## 2019-01-16 ENCOUNTER — Inpatient Hospital Stay: Payer: Medicare HMO

## 2019-01-16 LAB — BASIC METABOLIC PANEL
Anion gap: 10 (ref 5–15)
BUN: 8 mg/dL (ref 8–23)
CO2: 26 mmol/L (ref 22–32)
Calcium: 8.3 mg/dL — ABNORMAL LOW (ref 8.9–10.3)
Chloride: 91 mmol/L — ABNORMAL LOW (ref 98–111)
Creatinine, Ser: 0.57 mg/dL (ref 0.44–1.00)
GFR calc Af Amer: >60 mL/min (ref >60)
GFR calc non Af Amer: >60 mL/min (ref >60)
Glucose, Bld: 123 mg/dL — ABNORMAL HIGH (ref 70–99)
Potassium: 3.4 mmol/L — ABNORMAL LOW (ref 3.5–5.1)
Sodium: 127 mmol/L — ABNORMAL LOW (ref 135–145)

## 2019-01-16 LAB — CBC
HCT: 32.4 % — ABNORMAL LOW (ref 36.0–46.0)
Hemoglobin: 11 g/dL — ABNORMAL LOW (ref 12.0–15.0)
MCH: 27.1 pg (ref 26.0–34.0)
MCHC: 34 g/dL (ref 30.0–36.0)
MCV: 79.8 fL — ABNORMAL LOW (ref 80.0–100.0)
Platelets: 217 10*3/uL (ref 150–400)
RBC: 4.06 MIL/uL (ref 3.87–5.11)
RDW: 14.2 % (ref 11.5–15.5)
WBC: 11.7 10*3/uL — ABNORMAL HIGH (ref 4.0–10.5)
nRBC: 0 % (ref 0.0–0.2)

## 2019-01-16 LAB — URINALYSIS, ROUTINE W REFLEX MICROSCOPIC
Bilirubin Urine: NEGATIVE
Glucose, UA: NEGATIVE mg/dL
Hgb urine dipstick: NEGATIVE
Ketones, ur: 5 mg/dL — AB
Leukocytes,Ua: NEGATIVE
Nitrite: NEGATIVE
Protein, ur: NEGATIVE mg/dL
Specific Gravity, Urine: 1.005 (ref 1.005–1.030)
pH: 7 (ref 5.0–8.0)

## 2019-01-16 LAB — MAGNESIUM: Magnesium: 2 mg/dL (ref 1.7–2.4)

## 2019-01-16 MED ORDER — DILTIAZEM HCL ER COATED BEADS 120 MG PO CP24
120.0000 mg | ORAL_CAPSULE | Freq: Every day | ORAL | Status: DC
  Administered 2019-01-16 – 2019-01-17 (×2): 120 mg via ORAL
  Filled 2019-01-16 (×2): qty 1

## 2019-01-16 MED ORDER — SODIUM CHLORIDE 0.9% FLUSH: 3.0000 mL | INTRAVENOUS | Status: DC | PRN

## 2019-01-16 MED ORDER — SODIUM CHLORIDE 0.9% FLUSH
3.0000 mL | Freq: Two times a day (BID) | INTRAVENOUS | Status: DC
  Administered 2019-01-16: 3 mL via INTRAVENOUS

## 2019-01-16 MED ORDER — APIXABAN 2.5 MG PO TABS
2.5000 mg | ORAL_TABLET | Freq: Two times a day (BID) | ORAL | Status: DC
  Administered 2019-01-16 – 2019-01-17 (×3): 2.5 mg via ORAL
  Filled 2019-01-16 (×3): qty 1

## 2019-01-16 MED ORDER — POTASSIUM CHLORIDE IN NACL 20-0.9 MEQ/L-% IV SOLN
INTRAVENOUS | Status: DC
  Administered 2019-01-16 – 2019-01-17 (×2): via INTRAVENOUS
  Filled 2019-01-16 (×5): qty 1000

## 2019-01-16 NOTE — Progress Notes (Signed)
  Subjective: 2 Days Post-Op Procedure(s) (LRB): TOTAL KNEE ARTHROPLASTY RIGHT (Right) Patient reports pain as 8 on 0-10 scale.   Patient is doing well today however she did require transfer to the telemetry floor due to tachycardia. Internal medicine and cardiology consulted. PT and care management to assist with discharge planning. Negative for chest pain and shortness of breath Fever: no Gastrointestinal:Negative for nausea and vomiting  Objective: Vital signs in last 24 hours: Temp:  [98.5 F (36.9 C)-98.6 F (37 C)] 98.6 F (37 C) (06/19 1919) Pulse Rate:  [70-73] 73 (06/19 2102) Resp:  [18-20] 18 (06/19 1919) BP: (134-140)/(61-66) 134/66 (06/19 2102) SpO2:  [94 %-95 %] 95 % (06/19 2102)  Intake/Output from previous day:  Intake/Output Summary (Last 24 hours) at 01/16/2019 0808 Last data filed at 01/16/2019 0603 Gross per 24 hour  Intake 120 ml  Output 3470 ml  Net -3350 ml    Intake/Output this shift: No intake/output data recorded.  Labs: Recent Labs    01/14/19 1343 01/14/19 2058 01/15/19 0222 01/16/19 0556  HGB 9.7* 11.4* 11.1* 11.0*   Recent Labs    01/15/19 0222 01/16/19 0556  WBC 11.7* 11.7*  RBC 4.08 4.06  HCT 32.8* 32.4*  PLT 200 217   Recent Labs    01/15/19 0222 01/16/19 0556  NA 129* 127*  K 3.9 3.4*  CL 95* 91*  CO2 25 26  BUN 8 8  CREATININE 0.40* 0.57  GLUCOSE 151* 123*  CALCIUM 8.6* 8.3*   No results for input(s): LABPT, INR in the last 72 hours.   EXAM General - Patient is Alert, Appropriate and Oriented Extremity - ABD soft Sensation intact distally Intact pulses distally Dorsiflexion/Plantar flexion intact Incision: scant drainage No cellulitis present Dressing/Incision - blood tinged drainage, very minimal.  The Ace wrap was removed. Motor Function - intact, moving foot and toes well on exam.  Abdomen soft with normal BS.  Past Medical History:  Diagnosis Date  . Anxiety   . Breast cancer ([place])   . Cancer ([place])     ,bilateral lymph nodes removed  breast mostly from the left  . Cataracts, bilateral   . Complication of anesthesia   . Dysrhythmia    hx of AF  . Esophageal erosions   . GERD (gastroesophageal reflux disease)   . Hypertension    dr Saralyn Pilar  at [place] [facility]  . Personal history of radiation therapy   . PONV (postoperative nausea and vomiting)    spinal anesthesia x1    Assessment/Plan: 2 Days Post-Op Procedure(s) (LRB): TOTAL KNEE ARTHROPLASTY RIGHT (Right) Active Problems:   Status post total knee replacement using cement, right  Estimated body mass index is 33.92 kg/m as calculated from the following:   Height as of this encounter: 5\' 3"  (1.6 m).   Weight as of this encounter: 86.9 kg. Advance diet Up with therapy D/C IV fluids when tolerating po intake.  Labs reviewed this AM.  WBC 11.7 this morning. Continue to monitor tachycardia, cardiology consulted. Up with therapy today if able, she was able to work with PT yesterday. Continue working on BM. Plan to discharge home possibly tomorrow.  DVT Prophylaxis - Lovenox, Foot Pumps and TED hose Weight-Bearing as tolerated to right leg  Reche Dixon, PA-C [facility] Orthopaedic Surgery 01/16/2019, 8:08 AM

## 2019-01-16 NOTE — Plan of Care (Signed)
  Problem: Activity: Goal: Ability to avoid complications of mobility impairment will improve Outcome: Progressing Goal: Range of joint motion will improve Outcome: Progressing   Problem: Clinical Measurements: Goal: Postoperative complications will be avoided or minimized Outcome: Progressing   Problem: Pain Management: Goal: Pain level will decrease with appropriate interventions Outcome: Progressing   Problem: Clinical Measurements: Goal: Will remain free from infection Outcome: Progressing   Problem: Elimination: Goal: Will not experience complications related to urinary retention Outcome: Progressing

## 2019-01-16 NOTE — Progress Notes (Signed)
PT Cancellation Note  Patient Details Name: Cathy Spears MRN: [id number] DOB: 12/19/1935   Cancelled Treatment:    Reason Eval/Treat Not Completed: Other (comment).  Pt's daughter requesting pt receive pain medication prior to PT session (nurse unable to give earlier d/t pt going for chest x-ray).  Will re-attempt PT session later today.  Leitha Bleak, PT 01/16/19, 11:19 AM [phone]

## 2019-01-16 NOTE — Discharge Instructions (Signed)
TOTAL KNEE REPLACEMENT POSTOPERATIVE DIRECTIONS  Knee Rehabilitation, Guidelines Following Surgery  Results after knee surgery are often greatly improved when you follow the exercise, range of motion and muscle strengthening exercises prescribed by your doctor. Safety measures are also important to protect the knee from further injury. Any time any of these exercises cause you to have increased pain or swelling in your knee joint, decrease the amount until you are comfortable again and slowly increase them. If you have problems or questions, call your caregiver or physical therapist for advice.   HOME CARE INSTRUCTIONS  Remove items at home which could result in a fall. This includes throw rugs or furniture in walking pathways.  ICE using the Polar Care unit to the affected knee every three hours for 30 minutes at a time and then as needed for pain and swelling.  Place a dry towel or pillow case over the knee before applying the Polar Care Unit.  Continue to use ice on the knee for pain and swelling from surgery. You may notice swelling that will progress down to the foot and ankle.  This is normal after surgery.  Elevate the leg when you are not up walking on it.   Continue to use the breathing machine which will help keep your temperature down.  It is common for your temperature to cycle up and down following surgery, especially at night when you are not up moving around and exerting yourself.  The breathing machine keeps your lungs expanded and your temperature down. Do not place pillow under knee, focus on keeping the knee straight while resting  DIET You may resume your previous home diet once your are discharged from the hospital.  DRESSING / WOUND CARE / SHOWERING You may change your dressing 3-5 days after surgery.  Then change the dressing every day with sterile gauze.  Please use good hand washing techniques before changing the dressing.  Do not use any lotions or creams on the incision  until instructed by your surgeon. You need to keep your wound dry after being discharged home.  Just keep the incision dry and apply a dry gauze dressing on daily. Change the surgical dressing only if needed and reapply a dry dressing each time.  ACTIVITY Walk with your walker as instructed. Use walker as long as suggested by your caregivers. Avoid periods of inactivity such as sitting longer than an hour when not asleep. This helps prevent blood clots.  You may resume a sexual relationship in one month or when given the OK by your doctor.  You may return to work once you are cleared by your doctor.  Do not drive a car for 6 weeks or until released by you surgeon.  Do not drive while taking narcotics.  WEIGHT BEARING Weight bearing as tolerated with assist device (walker, cane, etc) as directed, use it as long as suggested by your surgeon or therapist, typically at least 4-6 weeks.  POSTOPERATIVE CONSTIPATION PROTOCOL Constipation - defined medically as fewer than three stools per week and severe constipation as less than one stool per week.  One of the most common issues patients have following surgery is constipation.  Even if you have a regular bowel pattern at home, your normal regimen is likely to be disrupted due to multiple reasons following surgery.  Combination of anesthesia, postoperative narcotics, change in appetite and fluid intake all can affect your bowels.  In order to avoid complications following surgery, here are some recommendations in order to help   you during your recovery period.  Colace (docusate) - Pick up an over-the-counter form of Colace or another stool softener and take twice a day as long as you are requiring postoperative pain medications.  Take with a full glass of water daily.  If you experience loose stools or diarrhea, hold the colace until you stool forms back up.  If your symptoms do not get better within 1 week or if they get worse, check with your  doctor.  Dulcolax (bisacodyl) - Pick up over-the-counter and take as directed by the product packaging as needed to assist with the movement of your bowels.  Take with a full glass of water.  Use this product as needed if not relieved by Colace only.   MiraLax (polyethylene glycol) - Pick up over-the-counter to have on hand.  MiraLax is a solution that will increase the amount of water in your bowels to assist with bowel movements.  Take as directed and can mix with a glass of water, juice, soda, coffee, or tea.  Take if you go more than two days without a movement. Do not use MiraLax more than once per day. Call your doctor if you are still constipated or irregular after using this medication for 7 days in a row.  If you continue to have problems with postoperative constipation, please contact the office for further assistance and recommendations.  If you experience "the worst abdominal pain ever" or develop nausea or vomiting, please contact the office immediatly for further recommendations for treatment.  ITCHING  If you experience itching with your medications, try taking only a single pain pill, or even half a pain pill at a time.  You can also use Benadryl over the counter for itching or also to help with sleep.   TED HOSE STOCKINGS Wear the elastic stockings on both legs for six weeks following surgery during the day but you may remove then at night for sleeping.  MEDICATIONS See your medication summary on the "After Visit Summary" that the nursing staff will review with you prior to discharge.  You may have some home medications which will be placed on hold until you complete the course of blood thinner medication.  It is important for you to complete the blood thinner medication as prescribed by your surgeon.  Continue your approved medications as instructed at time of discharge.  PRECAUTIONS If you experience chest pain or shortness of breath - call 911 immediately for transfer to the  hospital emergency department.  If you develop a fever greater that 101 F, purulent drainage from wound, increased redness or drainage from wound, foul odor from the wound/dressing, or calf pain - CONTACT YOUR SURGEON.                                                   FOLLOW-UP APPOINTMENTS Make sure you keep all of your appointments after your operation with your surgeon and caregivers. You should call the office at the above phone number and make an appointment for approximately two weeks after the date of your surgery or on the date instructed by your surgeon outlined in the "After Visit Summary".   RANGE OF MOTION AND STRENGTHENING EXERCISES  Rehabilitation of the knee is important following a knee injury or an operation. After just a few days of immobilization, the muscles of the thigh which   control the knee become weakened and shrink (atrophy). Knee exercises are designed to build up the tone and strength of the thigh muscles and to improve knee motion. Often times heat used for twenty to thirty minutes before working out will loosen up your tissues and help with improving the range of motion but do not use heat for the first two weeks following surgery. These exercises can be done on a training (exercise) mat, on the floor, on a table or on a bed. Use what ever works the best and is most comfortable for you Knee exercises include:  Leg Lifts - While your knee is still immobilized in a splint or cast, you can do straight leg raises. Lift the leg to 60 degrees, hold for 3 sec, and slowly lower the leg. Repeat 10-20 times 2-3 times daily. Perform this exercise against resistance later as your knee gets better.  Quad and Hamstring Sets - Tighten up the muscle on the front of the thigh (Quad) and hold for 5-10 sec. Repeat this 10-20 times hourly. Hamstring sets are done by pushing the foot backward against an object and holding for 5-10 sec. Repeat as with quad sets.  Leg Slides: Lying on your back,  slowly slide your foot toward your buttocks, bending your knee up off the floor (only go as far as is comfortable). Then slowly slide your foot back down until your leg is flat on the floor again. Angel Wings: Lying on your back spread your legs to the side as far apart as you can without causing discomfort.  A rehabilitation program following serious knee injuries can speed recovery and prevent re-injury in the future due to weakened muscles. Contact your doctor or a physical therapist for more information on knee rehabilitation.   IF YOU ARE TRANSFERRED TO A SKILLED REHAB FACILITY If the patient is transferred to a skilled rehab facility following release from the hospital, a list of the current medications will be sent to the facility for the patient to continue.  When discharged from the skilled rehab facility, please have the facility set up the patient's Home Health Physical Therapy prior to being released. Also, the skilled facility will be responsible for providing the patient with their medications at time of release from the facility to include their pain medication, the muscle relaxants, and their blood thinner medication. If the patient is still at the rehab facility at time of the two week follow up appointment, the skilled rehab facility will also need to assist the patient in arranging follow up appointment in our office and any transportation needs.  MAKE SURE YOU:  Understand these instructions.  Get help right away if you are not doing well or get worse.    Pick up stool softner and laxative for home use following surgery while on pain medications. Do not submerge incision under water. Please use good hand washing techniques while changing dressing each day. May shower starting three days after surgery. Please use a clean towel to pat the incision dry following showers. Continue to use ice for pain and swelling after surgery. Do not use any lotions or creams on the incision until  instructed by your surgeon.  

## 2019-01-16 NOTE — Progress Notes (Signed)
[facility] [place] NAME: Cathy Spears    MR#:  [id number]  DATE OF BIRTH:  [birth year]  SUBJECTIVE:  patient's heart rate earlier was in the 122 140s. Currently will heart rate 67. Daughter in the room. Patient understands English but does not talk much. She is of [place] origin  REVIEW OF SYSTEMS:   Review of Systems  Constitutional: Negative for chills, fever and weight loss.  HENT: Negative for ear discharge, ear pain and nosebleeds.   Eyes: Negative for blurred vision, pain and discharge.  Respiratory: Negative for sputum production, shortness of breath, wheezing and stridor.   Cardiovascular: Negative for chest pain, palpitations, orthopnea and PND.  Gastrointestinal: Negative for abdominal pain, diarrhea, nausea and vomiting.  Genitourinary: Negative for frequency and urgency.  Musculoskeletal: Positive for joint pain. Negative for back pain.  Neurological: Negative for sensory change, speech change and focal weakness.  Psychiatric/Behavioral: Negative for depression and hallucinations. The patient is not nervous/anxious.    Tolerating Diet:yes Tolerating PT: HHPT  DRUG ALLERGIES:   Allergies  Allergen Reactions  . Fish-Derived Products Swelling    Blue Fish years ago.  Caused redness over all of face    VITALS:  Blood pressure (!) 130/54, pulse (!) 152, temperature 100.3 F (37.9 C), temperature source Oral, resp. rate 20, height 5\' 3"  (1.6 m), weight 86.9 kg, SpO2 94 %.  PHYSICAL EXAMINATION:   Physical Exam  GENERAL:  83 y.o.-year-old patient lying in the bed with no acute distress.  EYES: Pupils equal, round, reactive to light and accommodation. No scleral icterus. Extraocular muscles intact.  HEENT: Head atraumatic, normocephalic. Oropharynx and nasopharynx clear.  NECK:  Supple, no jugular venous distention. No thyroid enlargement, no tenderness.  LUNGS: Normal breath sounds bilaterally, no wheezing, rales,  rhonchi. No use of accessory muscles of respiration.  CARDIOVASCULAR: S1, S2 normal. No murmurs, rubs, or gallops. Intermittent tachy ABDOMEN: Soft, nontender, nondistended. Bowel sounds present. No organomegaly or mass.  EXTREMITIES: No cyanosis, clubbing or edema b/l.    NEUROLOGIC: Cranial nerves II through XII are intact. No focal Motor or sensory deficits b/l.   PSYCHIATRIC:  patient is alert and oriented x 3.  SKIN: No obvious rash, lesion, or ulcer.   LABORATORY PANEL:  CBC Recent Labs  Lab 01/16/19 0556  WBC 11.7*  HGB 11.0*  HCT 32.4*  PLT 217    Chemistries  Recent Labs  Lab 01/14/19 2058  01/16/19 0556  NA 130*   < > 127*  K 3.7   < > 3.4*  CL 96*   < > 91*  CO2 23   < > 26  GLUCOSE 168*   < > 123*  BUN 9   < > 8  CREATININE 0.46   < > 0.57  CALCIUM 8.5*   < > 8.3*  MG 1.7  --  2.0  AST 20  --   --   ALT 16  --   --   ALKPHOS 49  --   --   BILITOT 0.9  --   --    < > = values in this interval not displayed.   Cardiac Enzymes Recent Labs  Lab 01/15/19 0835  TROPONINI <0.03   RADIOLOGY:  Ct Angio Chest Pe W Or Wo Contrast  Result Date: 01/15/2019 CLINICAL DATA:  83 y/o F; hypoxia post right total knee arthroplasty. EXAM: CT ANGIOGRAPHY CHEST WITH CONTRAST TECHNIQUE: Multidetector CT imaging of the chest was performed using  the standard protocol during bolus administration of intravenous contrast. Multiplanar CT image reconstructions and MIPs were obtained to evaluate the vascular anatomy. CONTRAST:  55mL OMNIPAQUE IOHEXOL 350 MG/ML SOLN COMPARISON:  12/18/2017 CT chest. FINDINGS: Cardiovascular: Main pulmonary artery is enlarged measuring 3.5 cm. Normal caliber thoracic aorta. Mild aortic calcific atherosclerosis and coronary artery calcific atherosclerosis. Dense mitral annular calcification. Cardiomegaly with biatrial enlargement. Mediastinum/Nodes: No enlarged mediastinal, hilar, or axillary lymph nodes. Thyroid gland, trachea, and esophagus demonstrate no  significant findings. Lungs/Pleura: Lungs are clear. No pleural effusion or pneumothorax. Upper Abdomen: No acute abnormality. Musculoskeletal: No chest wall abnormality. No acute or significant osseous findings. Review of the MIP images confirms the above findings. IMPRESSION: 1. No pulmonary embolus identified. 2. Enlarged main pulmonary artery indicates pulmonary artery hypertension. 3. Cardiomegaly with biatrial enlargement. 4. Mild aortic calcific atherosclerosis and coronary artery calcific atherosclerosis. 5. Clear lungs. Electronically Signed   By: Kristine Garbe M.D.   On: 01/15/2019 00:27   Dg Chest Port 1 View  Result Date: 01/14/2019 CLINICAL DATA:  Dyspnea. EXAM: PORTABLE CHEST 1 VIEW COMPARISON:  Radiographs 03/27/2018 FINDINGS: Mild cardiomegaly. Tortuous atherosclerotic thoracic aorta. Possible vascular congestion without pulmonary edema. Chronic blunting of the right costophrenic angle. No large effusion. No focal airspace disease or pneumothorax. Degenerative change of the shoulders. IMPRESSION: 1. Mild cardiomegaly, similar to prior allowing for differences in technique. 2. Possible vascular congestion. Electronically Signed   By: Keith Rake M.D.   On: 01/14/2019 21:14   ASSESSMENT AND PLAN:  1.Supraventricular tachycardia - Continue metoprolol -added cardizem and eliquis per cardiology. Pt has h/o Afib (per old records) -HR now in the 60's  2. Status post right total knee with postoperative pain -Pain is being managed with IV analgesic --Treatment per orthopedics  3. Hypomagnesemia - Was borderline low at 1.7--2.0  4.Hypertension --Continue Norvasc blood pressure stable  5.  Hyponatremia  continue IV normal saline follow sodium level I will discontinue HCTZ  6.  GERD continue PPI  D/w ortho and dter in the room  CODE STATUS: full  DVT Prophylaxis: eliquis  TOTAL TIME TAKING CARE OF THIS PATIENT: 30 minutes.  >50% time spent on  counselling and coordination of care   Note: This dictation was prepared with Dragon dictation along with smaller phrase technology. Any transcriptional errors that result from this process are unintentional.  Fritzi Mandes M.D on 01/16/2019 at 3:39 PM  Between 7am to 6pm - Pager - [phone]  After 6pm go to [email or web address] - password [email or web address] [facility]  Office  [phone]  CC: Primary care physician; Rusty Aus, MDPatient ID: Cathy Spears, female   DOB: February 20, 1936, 83 y.o.   MRN: [id number]

## 2019-01-16 NOTE — Progress Notes (Signed)
Pharmacy Electrolyte Monitoring Consult:  Pharmacy consulted to assist in monitoring and replacing electrolytes in this 83 y.o. female admitted on 01/14/2019. Patient s/p total knee replacement. Patient now with paroxysmal atrial fibrillation.   Labs:  Sodium (mmol/L)  Date Value  01/16/2019 127 (L)   Potassium (mmol/L)  Date Value  01/16/2019 3.4 (L)   Magnesium (mg/dL)  Date Value  01/16/2019 2.0   Calcium (mg/dL)  Date Value  01/16/2019 8.3 (L)   Albumin (g/dL)  Date Value  01/14/2019 3.7    Assessment/Plan: Patient with mild hyponatremia. Patient ordered NS at 76mL/hr. Will add potassium 19mEq per liter.   Will obtain electrolytes with am labs.   Will replace for goal potassium ~ 4 and goal magnesium ~ 2.   Pharmacy will continue to monitor and adjust per consult.    Belford Pascucci L 01/16/2019 12:55 PM

## 2019-01-16 NOTE — Progress Notes (Signed)
[facility] - Closed Cardiology  SUBJECTIVE: Patient laying in bed denies chest pain or shortness of breath   Vitals:   01/15/19 1919 01/15/19 2102 01/16/19 0833 01/16/19 0852  BP: 140/61 134/66 (!) 130/54   Pulse: 73 73  (!) 152  Resp: 18  20   Temp: 98.6 F (37 C)  100.3 F (37.9 C)   TempSrc:   Oral   SpO2: 95% 95% 94%   Weight:      Height:         Intake/Output Summary (Last 24 hours) at 01/16/2019 0948 Last data filed at 01/16/2019 0603 Gross per 24 hour  Intake 120 ml  Output 3470 ml  Net -3350 ml      PHYSICAL EXAM  General: Well developed, well nourished, in no acute distress HEENT:  Normocephalic and atramatic Neck:  No JVD.  Lungs: Clear bilaterally to auscultation and percussion. Heart: HRRR . Normal S1 and S2 without gallops or murmurs.  Abdomen: Bowel sounds are positive, abdomen soft and non-tender  Msk:  Back normal, normal gait. Normal strength and tone for age. Extremities: No clubbing, cyanosis or edema.   Neuro: Alert and oriented X 3. Psych:  Good affect, responds appropriately   LABS: Basic Metabolic Panel: Recent Labs    01/14/19 2058 01/15/19 0222 01/16/19 0556  NA 130* 129* 127*  K 3.7 3.9 3.4*  CL 96* 95* 91*  CO2 23 25 26   GLUCOSE 168* 151* 123*  BUN 9 8 8   CREATININE 0.46 0.40* 0.57  CALCIUM 8.5* 8.6* 8.3*  MG 1.7  --  2.0   Liver Function Tests: Recent Labs    01/14/19 2058  AST 20  ALT 16  ALKPHOS 49  BILITOT 0.9  PROT 6.7  ALBUMIN 3.7   No results for input(s): LIPASE, AMYLASE in the last 72 hours. CBC: Recent Labs    01/15/19 0222 01/16/19 0556  WBC 11.7* 11.7*  NEUTROABS 10.4*  --   HGB 11.1* 11.0*  HCT 32.8* 32.4*  MCV 80.4 79.8*  PLT 200 217   Cardiac Enzymes: Recent Labs    01/14/19 2058 01/15/19 0222 01/15/19 0835  TROPONINI <0.03 <0.03 <0.03   BNP: Invalid input(s): POCBNP D-Dimer: No results for input(s): DDIMER in the last 72 hours. Hemoglobin A1C: No results for input(s): HGBA1C in the last 72  hours. Fasting Lipid Panel: No results for input(s): CHOL, HDL, LDLCALC, TRIG, CHOLHDL, LDLDIRECT in the last 72 hours. Thyroid Function Tests: No results for input(s): TSH, T4TOTAL, T3FREE, THYROIDAB in the last 72 hours.  Invalid input(s): FREET3 Anemia Panel: No results for input(s): VITAMINB12, FOLATE, FERRITIN, TIBC, IRON, RETICCTPCT in the last 72 hours.  Ct Angio Chest Pe W Or Wo Contrast  Result Date: 01/15/2019 CLINICAL DATA:  83 y/o F; hypoxia post right total knee arthroplasty. EXAM: CT ANGIOGRAPHY CHEST WITH CONTRAST TECHNIQUE: Multidetector CT imaging of the chest was performed using the standard protocol during bolus administration of intravenous contrast. Multiplanar CT image reconstructions and MIPs were obtained to evaluate the vascular anatomy. CONTRAST:  66mL OMNIPAQUE IOHEXOL 350 MG/ML SOLN COMPARISON:  12/18/2017 CT chest. FINDINGS: Cardiovascular: Main pulmonary artery is enlarged measuring 3.5 cm. Normal caliber thoracic aorta. Mild aortic calcific atherosclerosis and coronary artery calcific atherosclerosis. Dense mitral annular calcification. Cardiomegaly with biatrial enlargement. Mediastinum/Nodes: No enlarged mediastinal, hilar, or axillary lymph nodes. Thyroid gland, trachea, and esophagus demonstrate no significant findings. Lungs/Pleura: Lungs are clear. No pleural effusion or pneumothorax. Upper Abdomen: No acute abnormality. Musculoskeletal: No chest wall abnormality. No acute  or significant osseous findings. Review of the MIP images confirms the above findings. IMPRESSION: 1. No pulmonary embolus identified. 2. Enlarged main pulmonary artery indicates pulmonary artery hypertension. 3. Cardiomegaly with biatrial enlargement. 4. Mild aortic calcific atherosclerosis and coronary artery calcific atherosclerosis. 5. Clear lungs. Electronically Signed   By: Kristine Garbe M.D.   On: 01/15/2019 00:27   Dg Chest Port 1 View  Result Date: 01/14/2019 CLINICAL DATA:   Dyspnea. EXAM: PORTABLE CHEST 1 VIEW COMPARISON:  Radiographs 03/27/2018 FINDINGS: Mild cardiomegaly. Tortuous atherosclerotic thoracic aorta. Possible vascular congestion without pulmonary edema. Chronic blunting of the right costophrenic angle. No large effusion. No focal airspace disease or pneumothorax. Degenerative change of the shoulders. IMPRESSION: 1. Mild cardiomegaly, similar to prior allowing for differences in technique. 2. Possible vascular congestion. Electronically Signed   By: Keith Rake M.D.   On: 01/14/2019 21:14   Dg Knee Right Port  Result Date: 01/14/2019 CLINICAL DATA:  RIGHT total knee replacement. EXAM: PORTABLE RIGHT KNEE - 1-2 VIEW COMPARISON:  None. FINDINGS: RIGHT total knee replacement changes noted. No definite complicating features. No acute fracture or dislocation. IMPRESSION: RIGHT total knee replacement without definite complicating features. Electronically Signed   By: Margarette Canada M.D.   On: 01/14/2019 11:54     Echo LVEF 60 to 65% with mild left ear  TELEMETRY: Predominant sinus rhythm at 80 bpm with intermittent episodes of atrial fibrillation with rapid ventricular rate:  ASSESSMENT AND PLAN:  Active Problems:   Status post total knee replacement using cement, right    1.  Paroxysmal atrial fibrillation with rapid ventricular rate, asymptomatic  Recommendations  1.  Start anticoagulation with low-dose Eliquis 2.5 mg twice daily 2.  Continue metoprolol tartrate 50 mg twice daily 3.  Add Cardizem CD 120 mg daily   Isaias Cowman, MD, PhD, [facility] 01/16/2019 9:48 AM

## 2019-01-16 NOTE — Progress Notes (Signed)
Physical Therapy Treatment Patient Details Name: Cathy Spears MRN: [id number] DOB: October 27, 1935 Today's Date: 01/16/2019    History of Present Illness Pt admitted for R TKR . Pt allowed daughter as approved visitor.  Pt transferred to telemetry d/t HR in 150's 01/14/19.    PT Comments    Pt's daughter interpreting but [place] interpreter on phone listening.  Pt had more difficulty standing from bed (min to mod assist) but able to stand from recliner with CGA.  Able to progress to ambulating 100 feet with RW CGA.  R knee flexion AAROM to 80 degrees.  HR in 70's during ambulation and O2 sats 94% or greater on 2 L O2 via nasal cannula during session's activities.  Will continue to focus on strengthening, knee ROM, and progressive functional mobility during hospital stay.    Follow Up Recommendations  Home health PT;Supervision/Assistance - 24 hour     Equipment Recommendations  Rolling walker with 5" wheels;3in1 (PT)    Recommendations for Other Services       Precautions / Restrictions Precautions Precautions: Fall;Knee Precaution Booklet Issued: No Restrictions Weight Bearing Restrictions: Yes RLE Weight Bearing: Weight bearing as tolerated    Mobility  Bed Mobility Overal bed mobility: Needs Assistance Bed Mobility: Supine to Sit     Supine to sit: Min assist     General bed mobility comments: assist for R LE; increased effort to perform; use of bedrail  Transfers   Equipment used: Rolling walker (2 wheeled) Transfers: Sit to/from [facility] Sit to Stand: Min assist;Mod assist;Min guard Stand pivot transfers: Min guard(stand step turn to [facility] over toilet)       General transfer comment: vc's for UE/LE placement; assist to initiate and come to full stand from bed (min to mod assist) but CGA to stand from recliner  Ambulation/Gait Ambulation/Gait assistance: Min guard Gait Distance (Feet): 100 Feet(plus 25 feet to bathroom) Assistive device:  Rolling walker (2 wheeled) Gait Pattern/deviations: Step-to pattern;Decreased stance time - right;Antalgic Gait velocity: decreased   General Gait Details: vc's to increase UE support through RW and for appropriate positioning within RW occasionally   Stairs             Wheelchair Mobility    Modified Rankin (Stroke Patients Only)       Balance Overall balance assessment: Needs assistance Sitting-balance support: Feet supported;No upper extremity supported Sitting balance-Leahy Scale: Good Sitting balance - Comments: steady sitting reaching within BOS   Standing balance support: Single extremity supported Standing balance-Leahy Scale: Poor Standing balance comment: pt requiring at least single UE support for static standing balance                            Cognition Arousal/Alertness: Awake/alert Behavior During Therapy: WFL for tasks assessed/performed Overall Cognitive Status: Within Functional Limits for tasks assessed                                        Exercises Total Joint Exercises Ankle Circles/Pumps: AROM;Strengthening;Both;10 reps;Supine Quad Sets: AROM;Strengthening;Both;10 reps;Supine Short Arc Quad: AAROM;Strengthening;Right;10 reps;Supine Heel Slides: AAROM;Strengthening;Right;10 reps;Supine Hip ABduction/ADduction: AAROM;Strengthening;Right;10 reps;Supine Straight Leg Raises: AAROM;Strengthening;Right;10 reps;Supine Goniometric ROM: R knee extension 8 degrees short of neutral semi-supine in bed; R knee flexion AAROM to 80 degrees sitting in recliner    General Comments   Nursing cleared pt for participation in physical therapy.  Pt and pt's daughter agreeable to PT session.  Interpreter service (on phone) took extra time for them to find [place] interpreter beginning of session.  Eventually service able to provide greek interpretor [place] ID # [id number].  PT returned to set-up pt's CPM around 5:40 pm (set to 0-55 degrees R  knee with good tolerance): pt and pt's daughter verbalizing appropriate understanding of how to stop CPM and of appropriate use.      Pertinent Vitals/Pain Pain Assessment: 0-10 Pain Score: 3  Pain Location: R knee Pain Descriptors / Indicators: Discomfort;Sore Pain Intervention(s): Limited activity within patient's tolerance;Monitored during session;Premedicated before session;Repositioned(pt has polar care)    Home Living                      Prior Function            PT Goals (current goals can now be found in the care plan section) Acute Rehab PT Goals Patient Stated Goal: to get stronger PT Goal Formulation: With patient Time For Goal Achievement: 01/28/19 Potential to Achieve Goals: Good Additional Goals Additional Goal #1: Pt will perform stair training x 5 steps with R railing to safely enter/exit home with cga Additional Goal #2: Pt will be able to perform bed mobility/transfers with supervision and RW to improve functional independence Progress towards PT goals: Progressing toward goals    Frequency    BID      PT Plan Current plan remains appropriate    Co-evaluation              AM-PAC PT "6 Clicks" Mobility   Outcome Measure  Help needed turning from your back to your side while in a flat bed without using bedrails?: A Little Help needed moving from lying on your back to sitting on the side of a flat bed without using bedrails?: A Little Help needed moving to and from a bed to a chair (including a wheelchair)?: A Little Help needed standing up from a chair using your arms (e.g., wheelchair or bedside chair)?: A Little Help needed to walk in hospital room?: A Little Help needed climbing 3-5 steps with a railing? : A Lot 6 Click Score: 17    End of Session Equipment Utilized During Treatment: Gait belt Activity Tolerance: Patient limited by pain Patient left: (sitting on BSC (over toilet) with NT present) Nurse Communication: Mobility  status;Precautions;Weight bearing status PT Visit Diagnosis: Muscle weakness (generalized) (M62.81);Difficulty in walking, not elsewhere classified (R26.2);Pain Pain - Right/Left: Right Pain - part of body: Knee     Time: 7829-5621 PT Time Calculation (min) (ACUTE ONLY): 44 min  Charges:  $Gait Training: 8-22 mins $Therapeutic Exercise: 8-22 mins $Therapeutic Activity: 8-22 mins                    Leitha Bleak, PT 01/16/19, 5:02 PM [phone]

## 2019-01-17 ENCOUNTER — Encounter: Payer: Self-pay | Admitting: Surgery

## 2019-01-17 LAB — ALBUMIN: Albumin: 2.8 g/dL — ABNORMAL LOW (ref 3.5–5.0)

## 2019-01-17 LAB — BASIC METABOLIC PANEL
Anion gap: 10 (ref 5–15)
BUN: 13 mg/dL (ref 8–23)
CO2: 25 mmol/L (ref 22–32)
Calcium: 7.8 mg/dL — ABNORMAL LOW (ref 8.9–10.3)
Chloride: 95 mmol/L — ABNORMAL LOW (ref 98–111)
Creatinine, Ser: 0.51 mg/dL (ref 0.44–1.00)
GFR calc Af Amer: >60 mL/min (ref >60)
GFR calc non Af Amer: >60 mL/min (ref >60)
Glucose, Bld: 110 mg/dL — ABNORMAL HIGH (ref 70–99)
Potassium: 3.4 mmol/L — ABNORMAL LOW (ref 3.5–5.1)
Sodium: 130 mmol/L — ABNORMAL LOW (ref 135–145)

## 2019-01-17 LAB — PHOSPHORUS: Phosphorus: 1.7 mg/dL — ABNORMAL LOW (ref 2.5–4.6)

## 2019-01-17 LAB — MAGNESIUM: Magnesium: 2.1 mg/dL (ref 1.7–2.4)

## 2019-01-17 MED ORDER — POTASSIUM PHOSPHATES 15 MMOLE/5ML IV SOLN
30.0000 mmol | Freq: Once | INTRAVENOUS | Status: AC
  Administered 2019-01-17: 08:00:00 30 mmol via INTRAVENOUS
  Filled 2019-01-17: qty 10

## 2019-01-17 MED ORDER — BISACODYL 10 MG RE SUPP
10.0000 mg | Freq: Once | RECTAL | Status: AC
  Administered 2019-01-17: 10 mg via RECTAL
  Filled 2019-01-17: qty 1

## 2019-01-17 MED ORDER — DILTIAZEM HCL ER COATED BEADS 120 MG PO CP24: 120.0000 mg | ORAL_CAPSULE | Freq: Every day | ORAL | 0 refills | Status: AC

## 2019-01-17 MED ORDER — APIXABAN 2.5 MG PO TABS: 2.5000 mg | ORAL_TABLET | Freq: Two times a day (BID) | ORAL | 0 refills | Status: DC

## 2019-01-17 MED ORDER — FLEET ENEMA 7-19 GM/118ML RE ENEM: 1.0000 | ENEMA | Freq: Once | RECTAL | Status: DC

## 2019-01-17 MED ORDER — OXYCODONE HCL 5 MG PO TABS: 5.0000 mg | ORAL_TABLET | ORAL | 0 refills | Status: DC | PRN

## 2019-01-17 MED ORDER — ONDANSETRON HCL 4 MG PO TABS: 4.0000 mg | ORAL_TABLET | Freq: Four times a day (QID) | ORAL | 0 refills | Status: DC | PRN

## 2019-01-17 MED ORDER — TRAMADOL HCL 50 MG PO TABS: 50.0000 mg | ORAL_TABLET | Freq: Four times a day (QID) | ORAL | 1 refills | Status: DC | PRN

## 2019-01-17 NOTE — Progress Notes (Signed)
Pharmacy Electrolyte Monitoring Consult:  Pharmacy consulted to assist in monitoring and replacing electrolytes in this 83 y.o. female admitted on 01/14/2019. Patient s/p total knee replacement. Patient now with paroxysmal atrial fibrillation.   Labs:  Sodium (mmol/L)  Date Value  01/17/2019 130 (L)   Potassium (mmol/L)  Date Value  01/17/2019 3.4 (L)   Magnesium (mg/dL)  Date Value  01/17/2019 2.1   Phosphorus (mg/dL)  Date Value  01/17/2019 1.7 (L)   Calcium (mg/dL)  Date Value  01/17/2019 7.8 (L)   Albumin (g/dL)  Date Value  01/14/2019 3.7   Corrected Calcium 8.8  Assessment/Plan: MIVF: NS/Potassium 6mEq at 69mL/hr.   Will order potassium phosphate 80mmol IV x 1.   Will obtain electrolytes with am labs.   Will replace for goal potassium ~ 4 and goal magnesium ~ 2.   Pharmacy will continue to monitor and adjust per consult.    Jaykub Mackins L 01/17/2019 7:29 AM

## 2019-01-17 NOTE — Progress Notes (Signed)
[place] at [place] NAME: Cathy Spears    MR#:  [id number]  DATE OF BIRTH:  1936-02-06  SUBJECTIVE:  Currently will heart rate 67-70. Daughter in the room. Patient understands English but does not talk much. She is of [place] origin Out in the chair.  REVIEW OF SYSTEMS:   Review of Systems  Constitutional: Negative for chills, fever and weight loss.  HENT: Negative for ear discharge, ear pain and nosebleeds.   Eyes: Negative for blurred vision, pain and discharge.  Respiratory: Negative for sputum production, shortness of breath, wheezing and stridor.   Cardiovascular: Negative for chest pain, palpitations, orthopnea and PND.  Gastrointestinal: Negative for abdominal pain, diarrhea, nausea and vomiting.  Genitourinary: Negative for frequency and urgency.  Musculoskeletal: Positive for joint pain. Negative for back pain.  Neurological: Negative for sensory change, speech change and focal weakness.  Psychiatric/Behavioral: Negative for depression and hallucinations. The patient is not nervous/anxious.    Tolerating Diet:yes Tolerating PT: HHPT  DRUG ALLERGIES:   Allergies  Allergen Reactions  . Fish-Derived Products Swelling    Blue Fish years ago.  Caused redness over all of face    VITALS:  Blood pressure (!) 122/55, pulse 70, temperature 98.5 F (36.9 C), temperature source Oral, resp. rate 19, height 5\' 3"  (1.6 m), weight 86.9 kg, SpO2 90 %.  PHYSICAL EXAMINATION:   Physical Exam  GENERAL:  83 y.o.-year-old patient lying in the bed with no acute distress.  EYES: Pupils equal, round, reactive to light and accommodation. No scleral icterus. Extraocular muscles intact.  HEENT: Head atraumatic, normocephalic. Oropharynx and nasopharynx clear.  NECK:  Supple, no jugular venous distention. No thyroid enlargement, no tenderness.  LUNGS: Normal breath sounds bilaterally, no wheezing, rales, rhonchi. No use of accessory  muscles of respiration.  CARDIOVASCULAR: S1, S2 normal. No murmurs, rubs, or gallops. Intermittent tachy ABDOMEN: Soft, nontender, nondistended. Bowel sounds present. No organomegaly or mass.  EXTREMITIES: No cyanosis, clubbing or edema b/l.    NEUROLOGIC: Cranial nerves II through XII are intact. No focal Motor or sensory deficits b/l.   PSYCHIATRIC:  patient is alert and oriented x 3.  SKIN: No obvious rash, lesion, or ulcer.   LABORATORY PANEL:  CBC Recent Labs  Lab 01/16/19 0556  WBC 11.7*  HGB 11.0*  HCT 32.4*  PLT 217    Chemistries  Recent Labs  Lab 01/14/19 2058  01/17/19 0448  NA 130*   < > 130*  K 3.7   < > 3.4*  CL 96*   < > 95*  CO2 23   < > 25  GLUCOSE 168*   < > 110*  BUN 9   < > 13  CREATININE 0.46   < > 0.51  CALCIUM 8.5*   < > 7.8*  MG 1.7   < > 2.1  AST 20  --   --   ALT 16  --   --   ALKPHOS 49  --   --   BILITOT 0.9  --   --    < > = values in this interval not displayed.   Cardiac Enzymes Recent Labs  Lab 01/15/19 0835  TROPONINI <0.03   RADIOLOGY:  Dg Chest 2 View  Result Date: 01/16/2019 CLINICAL DATA:  Fever.  Status post knee surgery 2 days ago. EXAM: CHEST - 2 VIEW COMPARISON:  01/14/2019 and prior radiographs FINDINGS: Cardiomegaly and mild peribronchial thickening again noted. There is no evidence of focal  airspace disease, pulmonary edema, suspicious pulmonary nodule/mass, pleural effusion, or pneumothorax. No acute bony abnormalities are identified. IMPRESSION: Cardiomegaly without evidence of acute cardiopulmonary disease. Electronically Signed   By: Margarette Canada M.D.   On: 01/16/2019 16:17   ASSESSMENT AND PLAN:  1.Supraventricular tachycardia - Continue metoprolol -added cardizem and eliquis per cardiology. Pt has h/o Afib (per old records) -HR now in the 60's  2. Status post right total knee with postoperative pain -Pain is being managed with IV and po analgesic --Treatment per orthopedics  3.  Hypomagnesemia/hypophosphatemia - Was borderline low at 1.7--2.0 -replete Phosphorus today -advised dter to give MVI po daily  4.Hypertension --Continue Norvasc blood pressure stable  5.  Hyponatremia   continue IV normal saline follow sodium level--sodium at 130 improved I will discontinue HCTZ  6.  GERD continue PPI  D/w ortho and dter in the room Plans for d/c later today  CODE STATUS: full  DVT Prophylaxis: eliquis  TOTAL TIME TAKING CARE OF THIS PATIENT: 30 minutes.  >50% time spent on counselling and coordination of care   Note: This dictation was prepared with Dragon dictation along with smaller phrase technology. Any transcriptional errors that result from this process are unintentional.  Fritzi Mandes M.D on 01/17/2019 at 8:25 AM  Between 7am to 6pm - Pager - [phone]  After 6pm go to [email or web address] - password [email or web address] [facility]  Office  [phone]  CC: Primary care physician; Rusty Aus, MDPatient ID: Cathy Spears, female   DOB: Nov 27, 1935, 83 y.o.   MRN: [id number]

## 2019-01-17 NOTE — Progress Notes (Signed)
Physical Therapy Treatment Patient Details Name: Cathy Spears MRN: [id number] DOB: 03-06-1936 Today's Date: 01/17/2019    History of Present Illness Pt admitted for R TKR . Pt allowed daughter as approved visitor.  Pt transferred to telemetry d/t HR in 150's 01/14/19.    PT Comments    Pt's daughter acting as interpretor during session but [place] interpretor present on phone as well.  Pt able to progress to ambulating 260 feet with RW CGA to SBA and navigated 5 steps with R railing CGA safely.  No knee buckling noted during session's activities.  HR 70-80 bpm and O2 sats 90% or greater on room air during session's activities.  R knee flexion AAROM to 90 degrees and extension 5 degrees short of neutral.  Reviewed and issued LE HEP.  Educated pt and pt's daughter on pacing and initial assist levels at home for safe functional mobility (pt's daughter verbalizing appropriate understanding).  Pt's daughter able to appropriately and safely give pt vc's as needed for functional mobility during session's activities.  Pt and pt's daughter reporting no further PT questions or concerns for discharge home.  Delivered RW for home use height adjusted. Plan for discharge home today with assist of daughter.   Follow Up Recommendations  Home health PT;Supervision/Assistance - 24 hour     Equipment Recommendations  Rolling walker with 5" wheels;3in1 (PT)    Recommendations for Other Services       Precautions / Restrictions Precautions Precautions: Fall;Knee Precaution Booklet Issued: Yes (comment) Restrictions Weight Bearing Restrictions: Yes RLE Weight Bearing: Weight bearing as tolerated    Mobility  Bed Mobility Overal bed mobility: Needs Assistance Bed Mobility: Supine to Sit     Supine to sit: Min assist     General bed mobility comments: bed flat; minimal assist for trunk; increased effort and time for pt to perform; pt's daughter reports no concerns for getting pt in/out of  bed  Transfers Overall transfer level: Needs assistance Equipment used: Rolling walker (2 wheeled) Transfers: Sit to/from [facility] Sit to Stand: Min guard;Supervision(x1 from mildly elevated bed (to simulate home height); x3 trials from recliner) Stand pivot transfers: Min guard;Supervision(bed to recliner)       General transfer comment: fairly strong transfers noted (pt's daughter giving occasional vc's but no vc's required from therapist)  Ambulation/Gait Ambulation/Gait assistance: Min guard;Supervision Gait Distance (Feet): 260 Feet Assistive device: Rolling walker (2 wheeled)   Gait velocity: decreased   General Gait Details: partial step through gait pattern; mild decreased stance time R LE; steady with RW   Stairs Stairs: Yes Stairs assistance: Min guard Stair Management: One rail Right;Step to pattern;Sideways Number of Stairs: 5 General stair comments: initial vc's and demo for technique from therapist (pt's daughter providing occasional vc's while pt performing steps); steady and safe stairs navigation; no knee buckling noted   Wheelchair Mobility    Modified Rankin (Stroke Patients Only)       Balance Overall balance assessment: Needs assistance Sitting-balance support: Feet supported;No upper extremity supported Sitting balance-Leahy Scale: Normal Sitting balance - Comments: steady sitting reaching outside BOS   Standing balance support: No upper extremity supported Standing balance-Leahy Scale: Good Standing balance comment: steady standing reaching within BOS                            Cognition Arousal/Alertness: Awake/alert Behavior During Therapy: WFL for tasks assessed/performed Overall Cognitive Status: Within Functional Limits for tasks assessed  Exercises Total Joint Exercises Goniometric ROM: R knee extension AAROM 5 degrees short of neutral semi-supine  in chair; R knee flexion AAROM to 90 degrees sitting in recliner    General Comments General comments (skin integrity, edema, etc.): mild drainage noted R knee honeycomb dressing.  Nursing cleared pt for participation in physical therapy.  Pt agreeable to PT session.  [place] interpretor [place] ID# [phone] present on phone during session.      Pertinent Vitals/Pain Pain Assessment: Faces Faces Pain Scale: Hurts little more Pain Location: R knee Pain Descriptors / Indicators: Discomfort;Sore Pain Intervention(s): Limited activity within patient's tolerance;Monitored during session;Premedicated before session;Repositioned(polar care applied and activated)    Home Living                      Prior Function            PT Goals (current goals can now be found in the care plan section) Acute Rehab PT Goals Patient Stated Goal: to get stronger PT Goal Formulation: With patient Time For Goal Achievement: 01/28/19 Potential to Achieve Goals: Good Additional Goals Additional Goal #1: Pt will perform stair training x 5 steps with R railing to safely enter/exit home with cga Additional Goal #2: Pt will be able to perform bed mobility/transfers with supervision and RW to improve functional independence Progress towards PT goals: Progressing toward goals    Frequency    BID      PT Plan Current plan remains appropriate    Co-evaluation              AM-PAC PT "6 Clicks" Mobility   Outcome Measure  Help needed turning from your back to your side while in a flat bed without using bedrails?: A Little Help needed moving from lying on your back to sitting on the side of a flat bed without using bedrails?: A Little Help needed moving to and from a bed to a chair (including a wheelchair)?: A Little Help needed standing up from a chair using your arms (e.g., wheelchair or bedside chair)?: A Little Help needed to walk in hospital room?: A Little Help needed climbing 3-5 steps  with a railing? : A Little 6 Click Score: 18    End of Session Equipment Utilized During Treatment: Gait belt Activity Tolerance: Patient tolerated treatment well Patient left: in chair;with call bell/phone within reach;with chair alarm set;with family/visitor present;with SCD's reapplied(B heels elevated via towel rolls; polar care in place and activated) Nurse Communication: Mobility status;Precautions;Weight bearing status PT Visit Diagnosis: Muscle weakness (generalized) (M62.81);Difficulty in walking, not elsewhere classified (R26.2);Pain Pain - Right/Left: Right Pain - part of body: Knee     Time: 1001-1100 PT Time Calculation (min) (ACUTE ONLY): 59 min  Charges:  $Gait Training: 23-37 mins $Therapeutic Exercise: 8-22 mins $Therapeutic Activity: 8-22 mins                    Leitha Bleak, PT 01/17/19, 12:25 PM [phone]

## 2019-01-17 NOTE — TOC Transition Note (Signed)
Transition of Care [facility]) - CM/SW Discharge Note   Patient Details  Name: Cathy Spears MRN: [id number] Date of Birth: November 12, 1935  Transition of Care [facility]) CM/SW Contact:  Latanya Maudlin, RN Phone Number: 01/17/2019, 10:31 AM   Clinical Narrative:   Patient to be discharged per MD order. Orders in place for home health services. [facility] unable to take patient due to insurance authorization issues. CMS Medicare.gov Compare Post Acute Care list reviewed with patient and referral was placed with [facility]. Need for DME rolling walker and bedside commode, delivered to bedside via [facility]. Family to transport.     Final next level of care: Home w Home Health Services Barriers to Discharge: No Barriers Identified   Patient Goals and CMS Choice   CMS Medicare.gov Compare Post Acute Care list provided to:: Patient Choice offered to / list presented to : Patient  Discharge Placement                       Discharge Plan and Services                DME Arranged: 3-N-1, Walker rolling DME Agency: [facility] Date DME Agency Contacted: 01/17/19 Time DME Agency Contacted: 63 Representative spoke with at DME Agency: [place]: PT [place]: [place] Date [place]: 01/17/19 Time [place]: 1031 Representative spoke with at [place]: [place]) Interventions     Readmission Risk Interventions Readmission Risk Prevention Plan 01/17/2019  Post Dischage Appt Complete  Medication Screening Complete  Transportation Screening Complete  Some recent data might be hidden

## 2019-01-17 NOTE — Progress Notes (Signed)
Patient discharged home today with daughter, follow up appointment information provided along with medication regimen information and education. New honeycomb dressing applied to right knee incision site, tolerated well by patient. VSS, no complaints or concerns at this time.

## 2019-01-17 NOTE — Progress Notes (Signed)
[facility] Cardiology  SUBJECTIVE: Patient laying comfortably in bed, denies chest pain, shortness of breath or palpitations   Vitals:   01/16/19 2013 01/16/19 2308 01/17/19 0404 01/17/19 0821  BP: (!) 126/58 (!) 105/50 (!) 105/51 (!) 122/55  Pulse: 73 71 64 70  Resp: 18  18 19   Temp: 98.7 F (37.1 C)  98.5 F (36.9 C)   TempSrc: Oral  Oral   SpO2: 95%  93% 90%  Weight:      Height:         Intake/Output Summary (Last 24 hours) at 01/17/2019 1026 Last data filed at 01/17/2019 0900 Gross per 24 hour  Intake 840 ml  Output 450 ml  Net 390 ml      PHYSICAL EXAM  General: Well developed, well nourished, in no acute distress HEENT:  Normocephalic and atramatic Neck:  No JVD.  Lungs: Clear bilaterally to auscultation and percussion. Heart: HRRR . Normal S1 and S2 without gallops or murmurs.  Abdomen: Bowel sounds are positive, abdomen soft and non-tender  Msk:  Back normal, normal gait. Normal strength and tone for age. Extremities: No clubbing, cyanosis or edema.   Neuro: Alert and oriented X 3. Psych:  Good affect, responds appropriately   LABS: Basic Metabolic Panel: Recent Labs    01/16/19 0556 01/17/19 0448  NA 127* 130*  K 3.4* 3.4*  CL 91* 95*  CO2 26 25  GLUCOSE 123* 110*  BUN 8 13  CREATININE 0.57 0.51  CALCIUM 8.3* 7.8*  MG 2.0 2.1  PHOS  --  1.7*   Liver Function Tests: Recent Labs    01/14/19 2058 01/17/19 0448  AST 20  --   ALT 16  --   ALKPHOS 49  --   BILITOT 0.9  --   PROT 6.7  --   ALBUMIN 3.7 2.8*   No results for input(s): LIPASE, AMYLASE in the last 72 hours. CBC: Recent Labs    01/15/19 0222 01/16/19 0556  WBC 11.7* 11.7*  NEUTROABS 10.4*  --   HGB 11.1* 11.0*  HCT 32.8* 32.4*  MCV 80.4 79.8*  PLT 200 217   Cardiac Enzymes: Recent Labs    01/14/19 2058 01/15/19 0222 01/15/19 0835  TROPONINI <0.03 <0.03 <0.03   BNP: Invalid input(s): POCBNP D-Dimer: No results for input(s): DDIMER in the last 72 hours. Hemoglobin  A1C: No results for input(s): HGBA1C in the last 72 hours. Fasting Lipid Panel: No results for input(s): CHOL, HDL, LDLCALC, TRIG, CHOLHDL, LDLDIRECT in the last 72 hours. Thyroid Function Tests: No results for input(s): TSH, T4TOTAL, T3FREE, THYROIDAB in the last 72 hours.  Invalid input(s): FREET3 Anemia Panel: No results for input(s): VITAMINB12, FOLATE, FERRITIN, TIBC, IRON, RETICCTPCT in the last 72 hours.  Dg Chest 2 View  Result Date: 01/16/2019 CLINICAL DATA:  Fever.  Status post knee surgery 2 days ago. EXAM: CHEST - 2 VIEW COMPARISON:  01/14/2019 and prior radiographs FINDINGS: Cardiomegaly and mild peribronchial thickening again noted. There is no evidence of focal airspace disease, pulmonary edema, suspicious pulmonary nodule/mass, pleural effusion, or pneumothorax. No acute bony abnormalities are identified. IMPRESSION: Cardiomegaly without evidence of acute cardiopulmonary disease. Electronically Signed   By: Margarette Canada M.D.   On: 01/16/2019 16:17     Echo LVEF 60 to 65%  TELEMETRY: Sinus rhythm at 72 bpm:  ASSESSMENT AND PLAN:  Active Problems:   Status post total knee replacement using cement, right    1.  Paroxysmal atrial fibrillation, appears stabilized on metoprolol tartrate  and Cardizem CD  Recommendations  1.  Agree with current therapy 2.  Continue Eliquis for stroke prevention 3.  Follow-up in 1 to 2 weeks  Sign off for now, please call if any questions   Isaias Cowman, MD, PhD, [facility] 01/17/2019 10:26 AM

## 2019-01-17 NOTE — Plan of Care (Signed)
  Problem: Education: Goal: Knowledge of the prescribed therapeutic regimen will improve Outcome: Adequate for Discharge Goal: Individualized Educational Video(s) Outcome: Adequate for Discharge   Problem: Activity: Goal: Ability to avoid complications of mobility impairment will improve Outcome: Adequate for Discharge Goal: Range of joint motion will improve Outcome: Adequate for Discharge   Problem: Clinical Measurements: Goal: Postoperative complications will be avoided or minimized Outcome: Adequate for Discharge   Problem: Pain Management: Goal: Pain level will decrease with appropriate interventions Outcome: Adequate for Discharge   Problem: Skin Integrity: Goal: Will show signs of wound healing Outcome: Adequate for Discharge   Problem: Acute Rehab PT Goals(only PT should resolve) Goal: Pt Will Ambulate Outcome: Adequate for Discharge Goal: Pt/caregiver will Perform Home Exercise Program Outcome: Adequate for Discharge Goal: PT Additional Goal #1 Outcome: Adequate for Discharge   Problem: Acute Rehab PT Goals(only PT should resolve) Goal: PT Additional Goal #2 Outcome: Adequate for Discharge   Problem: Education: Goal: Knowledge of General Education information will improve Description: Including pain rating scale, medication(s)/side effects and non-pharmacologic comfort measures Outcome: Adequate for Discharge   Problem: Health Behavior/Discharge Planning: Goal: Ability to manage health-related needs will improve Outcome: Adequate for Discharge   Problem: Clinical Measurements: Goal: Ability to maintain clinical measurements within normal limits will improve Outcome: Adequate for Discharge Goal: Will remain free from infection Outcome: Adequate for Discharge Goal: Diagnostic test results will improve Outcome: Adequate for Discharge Goal: Respiratory complications will improve Outcome: Adequate for Discharge Goal: Cardiovascular complication will be  avoided Outcome: Adequate for Discharge   Problem: Activity: Goal: Risk for activity intolerance will decrease Outcome: Adequate for Discharge   Problem: Nutrition: Goal: Adequate nutrition will be maintained Outcome: Adequate for Discharge   Problem: Coping: Goal: Level of anxiety will decrease Outcome: Adequate for Discharge   Problem: Elimination: Goal: Will not experience complications related to bowel motility Outcome: Adequate for Discharge Goal: Will not experience complications related to urinary retention Outcome: Adequate for Discharge   Problem: Pain Managment: Goal: General experience of comfort will improve Outcome: Adequate for Discharge   Problem: Safety: Goal: Ability to remain free from injury will improve Outcome: Adequate for Discharge   Problem: Skin Integrity: Goal: Risk for impaired skin integrity will decrease Outcome: Adequate for Discharge

## 2019-01-17 NOTE — Progress Notes (Signed)
  Subjective: 3 Days Post-Op Procedure(s) (LRB): TOTAL KNEE ARTHROPLASTY RIGHT (Right) Patient reports pain as moderate.   Patient is doing well today. Internal medicine and cardiology helping with tachycardia, which is becoming more controlled. PT and care management to assist with discharge planning. Negative for chest pain and shortness of breath Fever: no Gastrointestinal:Negative for nausea and vomiting  Objective: Vital signs in last 24 hours: Temp:  [98.5 F (36.9 C)-100.3 F (37.9 C)] 98.5 F (36.9 C) (06/21 0404) Pulse Rate:  [64-152] 64 (06/21 0404) Resp:  [18-20] 18 (06/21 0404) BP: (102-130)/(50-58) 105/51 (06/21 0404) SpO2:  [93 %-96 %] 93 % (06/21 0404)  Intake/Output from previous day:  Intake/Output Summary (Last 24 hours) at 01/17/2019 0737 Last data filed at 01/17/2019 0100 Gross per 24 hour  Intake 600 ml  Output 850 ml  Net -250 ml    Intake/Output this shift: No intake/output data recorded.  Labs: Recent Labs    01/14/19 1343 01/14/19 2058 01/15/19 0222 01/16/19 0556  HGB 9.7* 11.4* 11.1* 11.0*   Recent Labs    01/15/19 0222 01/16/19 0556  WBC 11.7* 11.7*  RBC 4.08 4.06  HCT 32.8* 32.4*  PLT 200 217   Recent Labs    01/16/19 0556 01/17/19 0448  NA 127* 130*  K 3.4* 3.4*  CL 91* 95*  CO2 26 25  BUN 8 13  CREATININE 0.57 0.51  GLUCOSE 123* 110*  CALCIUM 8.3* 7.8*   No results for input(s): LABPT, INR in the last 72 hours.   EXAM General - Patient is Alert, Appropriate and Oriented Extremity - ABD soft Sensation intact distally Intact pulses distally Dorsiflexion/Plantar flexion intact Incision: scant drainage No cellulitis present Dressing/Incision - blood tinged drainage, very minimal.  The Ace wrap was removed. Motor Function - intact, moving foot and toes well on exam.  Abdomen soft with normal BS.  Past Medical History:  Diagnosis Date  . Anxiety   . Breast cancer ([place])   . Cancer ([place])    ,bilateral lymph nodes  removed  breast mostly from the left  . Cataracts, bilateral   . Complication of anesthesia   . Dysrhythmia    hx of AF  . Esophageal erosions   . GERD (gastroesophageal reflux disease)   . Hypertension    dr Saralyn Pilar  at [facility]  . Personal history of radiation therapy   . PONV (postoperative nausea and vomiting)    spinal anesthesia x1    Assessment/Plan: 3 Days Post-Op Procedure(s) (LRB): TOTAL KNEE ARTHROPLASTY RIGHT (Right) Active Problems:   Status post total knee replacement using cement, right  Estimated body mass index is 33.92 kg/m as calculated from the following:   Height as of this encounter: 5\' 3"  (1.6 m).   Weight as of this encounter: 86.9 kg. Advance diet Up with therapy D/C IV fluids when tolerating po intake.  Labs reviewed this AM.  WBC 11.7 this morning. Continue to monitor tachycardia, cardiology consulted. Up with therapy today if able, she was able to work with PT yesterday. Continue working on BM. Plan to discharge home possibly today if cleared by medicine.  DVT Prophylaxis - Lovenox, Foot Pumps and TED hose Weight-Bearing as tolerated to right leg  Reche Dixon, PA-C [facility] Orthopaedic Surgery 01/17/2019, 7:37 AM

## 2019-01-25 ENCOUNTER — Other Ambulatory Visit: Payer: Self-pay

## 2019-01-25 ENCOUNTER — Ambulatory Visit
Admission: RE | Admit: 2019-01-25 | Discharge: 2019-01-25 | Disposition: A | Discharge status: Home / Self Care | Payer: Medicare HMO | Source: Ambulatory Visit | Attending: Student | Admitting: Student

## 2019-01-25 ENCOUNTER — Other Ambulatory Visit: Payer: Self-pay | Admitting: Student

## 2019-01-25 DIAGNOSIS — Z96651 Presence of right artificial knee joint: Secondary | ICD-10-CM

## 2019-01-31 ENCOUNTER — Other Ambulatory Visit: Payer: Self-pay

## 2019-01-31 ENCOUNTER — Emergency Department
Admission: EM | Admit: 2019-01-31 | Discharge: 2019-02-01 | Disposition: A | Discharge status: Home / Self Care | Payer: Medicare HMO | Attending: Emergency Medicine | Admitting: Emergency Medicine

## 2019-01-31 ENCOUNTER — Encounter: Payer: Self-pay | Admitting: Emergency Medicine

## 2019-01-31 DIAGNOSIS — Z96651 Presence of right artificial knee joint: Secondary | ICD-10-CM | POA: Diagnosis not present

## 2019-01-31 DIAGNOSIS — L509 Urticaria, unspecified: Secondary | ICD-10-CM | POA: Insufficient documentation

## 2019-01-31 DIAGNOSIS — Z853 Personal history of malignant neoplasm of breast: Secondary | ICD-10-CM | POA: Insufficient documentation

## 2019-01-31 DIAGNOSIS — L259 Unspecified contact dermatitis, unspecified cause: Secondary | ICD-10-CM | POA: Insufficient documentation

## 2019-01-31 DIAGNOSIS — Z79899 Other long term (current) drug therapy: Secondary | ICD-10-CM | POA: Insufficient documentation

## 2019-01-31 DIAGNOSIS — Z7901 Long term (current) use of anticoagulants: Secondary | ICD-10-CM | POA: Diagnosis not present

## 2019-01-31 DIAGNOSIS — R21 Rash and other nonspecific skin eruption: Secondary | ICD-10-CM | POA: Diagnosis present

## 2019-01-31 DIAGNOSIS — I10 Essential (primary) hypertension: Secondary | ICD-10-CM | POA: Insufficient documentation

## 2019-01-31 LAB — BASIC METABOLIC PANEL
Anion gap: 10 (ref 5–15)
BUN: 12 mg/dL (ref 8–23)
CO2: 24 mmol/L (ref 22–32)
Calcium: 9.2 mg/dL (ref 8.9–10.3)
Chloride: 101 mmol/L (ref 98–111)
Creatinine, Ser: 0.47 mg/dL (ref 0.44–1.00)
GFR calc Af Amer: >60 mL/min (ref >60)
GFR calc non Af Amer: >60 mL/min (ref >60)
Glucose, Bld: 96 mg/dL (ref 70–99)
Potassium: 4 mmol/L (ref 3.5–5.1)
Sodium: 135 mmol/L (ref 135–145)

## 2019-01-31 LAB — CBC WITH DIFFERENTIAL/PLATELET
Abs Immature Granulocytes: 0.02 10*3/uL (ref 0.00–0.07)
Basophils Absolute: 0 10*3/uL (ref 0.0–0.1)
Basophils Relative: 0 %
Eosinophils Absolute: 0.1 10*3/uL (ref 0.0–0.5)
Eosinophils Relative: 1 %
HCT: 33.6 % — ABNORMAL LOW (ref 36.0–46.0)
Hemoglobin: 10.7 g/dL — ABNORMAL LOW (ref 12.0–15.0)
Immature Granulocytes: 0 %
Lymphocytes Relative: 12 %
Lymphs Abs: 0.8 10*3/uL (ref 0.7–4.0)
MCH: 26.8 pg (ref 26.0–34.0)
MCHC: 31.8 g/dL (ref 30.0–36.0)
MCV: 84 fL (ref 80.0–100.0)
Monocytes Absolute: 0.4 10*3/uL (ref 0.1–1.0)
Monocytes Relative: 6 %
Neutro Abs: 5.7 10*3/uL (ref 1.7–7.7)
Neutrophils Relative %: 81 %
Platelets: 432 10*3/uL — ABNORMAL HIGH (ref 150–400)
RBC: 4 MIL/uL (ref 3.87–5.11)
RDW: 16.1 % — ABNORMAL HIGH (ref 11.5–15.5)
WBC: 7 10*3/uL (ref 4.0–10.5)
nRBC: 0 % (ref 0.0–0.2)

## 2019-01-31 MED ORDER — DEXAMETHASONE SODIUM PHOSPHATE 10 MG/ML IJ SOLN
10.0000 mg | Freq: Once | INTRAMUSCULAR | Status: AC
  Administered 2019-01-31: 10 mg via INTRAVENOUS
  Filled 2019-01-31: qty 1

## 2019-01-31 MED ORDER — FAMOTIDINE IN NACL 20-0.9 MG/50ML-% IV SOLN
20.0000 mg | Freq: Once | INTRAVENOUS | Status: AC
  Administered 2019-01-31: 20 mg via INTRAVENOUS
  Filled 2019-01-31: qty 50

## 2019-01-31 MED ORDER — ACETAMINOPHEN 500 MG PO TABS
1000.0000 mg | ORAL_TABLET | Freq: Once | ORAL | Status: AC
  Administered 2019-01-31: 1000 mg via ORAL
  Filled 2019-01-31: qty 2

## 2019-01-31 MED ORDER — DIPHENHYDRAMINE HCL 25 MG PO CAPS: 25.0000 mg | ORAL_CAPSULE | Freq: Once | ORAL | Status: DC

## 2019-01-31 MED ORDER — DIPHENHYDRAMINE HCL 25 MG PO CAPS
ORAL_CAPSULE | ORAL | Status: AC
  Administered 2019-01-31: 25 mg
  Filled 2019-01-31: qty 1

## 2019-01-31 NOTE — ED Notes (Signed)
Daughter with patient due to patient having difficulty communicating with staff due to language barrier.  Daughter previously stayed with patient during last encounter at the hospital.

## 2019-01-31 NOTE — ED Notes (Signed)
Benadryl not effective, rash red and swollen to right arm.

## 2019-01-31 NOTE — ED Provider Notes (Signed)
[facility] Emergency Department Provider Note ____________________________________________  Time seen: 2045  I have reviewed the triage vital signs and the nursing notes.  HISTORY  Chief Complaint  Rash  History limited by [place] language.  Patient's adult daughter is present to serve as interpreter.  HPI Cathy Spears is a 83 y.o. female presents to theED for evaluation of itching and swelling to the right forearm. She reports intermittent whelps that have waxed and waned in the same area.  She describes itching to the wrist and thumb last night while asleep, prior to the onset of the rash.  She presents today after the rash began as a large area of erythema to the volar forearm and medial upper arm.  She denies any interim fevers, chills, sweats, chest pain, or shortness of breath.  She also denies any abnormal bruising and or bleeding.  Patient is 2 weeks status post a right total knee replacement by Dr. Roland Rack.  She was diagnosed during her hospital course with a new onset A. fib, and started on Eliquis as well as Cardizem.  She has had follow-up visits with both her orthopedic physician and her cardiologist in the interim.  She denies any previous history of allergic reactions, hives, urticaria, or upper extremity edema.  She denies any unusual or unknown exposures, allergens, or triggers.  She does Benadryl prior to arrival with denies any significant improvement in her symptoms.  Past Medical History:  Diagnosis Date  . Anxiety   . Breast cancer ([place])   . Cancer ([place])    ,bilateral lymph nodes removed  breast mostly from the left  . Cataracts, bilateral   . Complication of anesthesia   . Dysrhythmia    hx of AF  . Esophageal erosions   . GERD (gastroesophageal reflux disease)   . Hypertension    dr Saralyn Pilar  at [facility]  . Personal history of radiation therapy   . PONV (postoperative nausea and vomiting)    spinal anesthesia x1    Patient  Active Problem List   Diagnosis Date Noted  . Status post total knee replacement using cement, right 01/14/2019    Past Surgical History:  Procedure Laterality Date  . BALLOON DILATION N/A 09/24/2017   Procedure: BALLOON DILATION;  Surgeon: Toledo, Benay Pike, MD;  Location: [facility] ENDOSCOPY;  Service: Gastroenterology;  Laterality: N/A;  . BREAST BIOPSY Right   . BREAST LUMPECTOMY Bilateral    2010  . BREAST SURGERY     lumpectomy bil,pt. states lymph nodes were removed and  the left arm is restricted  . COLONOSCOPY WITH ESOPHAGOGASTRODUODENOSCOPY (EGD)    . ESOPHAGOGASTRODUODENOSCOPY (EGD) WITH PROPOFOL N/A 09/24/2017   Procedure: ESOPHAGOGASTRODUODENOSCOPY (EGD) WITH PROPOFOL;  Surgeon: Toledo, Benay Pike, MD;  Location: [facility] ENDOSCOPY;  Service: Gastroenterology;  Laterality: N/A;  . HERNIA REPAIR    . LUMBAR LAMINECTOMY/DECOMPRESSION MICRODISCECTOMY  08/22/2011   Procedure: LUMBAR LAMINECTOMY/DECOMPRESSION MICRODISCECTOMY;  Surgeon: Ophelia Charter, MD;  Location: [facility];  Service: Neurosurgery;  Laterality: Right;  RIGHT Lumbar five sacral one  laminectomy and microdiscectomy  . TOTAL KNEE ARTHROPLASTY Right 01/14/2019   Procedure: TOTAL KNEE ARTHROPLASTY RIGHT;  Surgeon: Corky Mull, MD;  Location: [facility] ORS;  Service: Orthopedics;  Laterality: Right;    Prior to Admission medications   Medication Sig Start Date End Date Taking? Authorizing Provider  apixaban (ELIQUIS) 2.5 MG TABS tablet Take 1 tablet (2.5 mg total) by mouth 2 (two) times daily. 01/17/19   Reche Dixon, PA-C  Calcium Carbonate-Vitamin D (TH CALCIUM CARBONATE-VITAMIN D) 600-400 MG-UNIT tablet Take 1 tablet by mouth 2 (two) times daily.    [provider]  cholecalciferol (VITAMIN D) 1000 UNITS tablet Take 1,000 Units by mouth daily.    [provider]  diltiazem (CARDIZEM CD) 120 MG 24 hr capsule Take 1 capsule (120 mg total) by mouth daily. 01/17/19   Reche Dixon, PA-C  metoprolol (LOPRESSOR) 50  MG tablet Take 50 mg by mouth 2 (two) times daily.    [provider]  Multiple Vitamins-Minerals (PRESERVISION AREDS PO) Take 1 tablet by mouth 2 (two) times daily.    [provider]  omeprazole (PRILOSEC) 40 MG capsule Take 40 mg by mouth 2 (two) times a day.     [provider]  ondansetron (ZOFRAN) 4 MG tablet Take 1 tablet (4 mg total) by mouth every 6 (six) hours as needed for nausea. 01/17/19   Reche Dixon, PA-C  oxyCODONE (OXY IR/ROXICODONE) 5 MG immediate release tablet Take 1-2 tablets (5-10 mg total) by mouth every 4 (four) hours as needed for moderate pain (pain score 4-6). 01/17/19   Reche Dixon, PA-C  traMADol (ULTRAM) 50 MG tablet Take 1 tablet (50 mg total) by mouth every 6 (six) hours as needed for moderate pain. 01/17/19   Reche Dixon, PA-C  vitamin B-12 (CYANOCOBALAMIN) 1000 MCG tablet Take 1,000 mcg by mouth daily.    [provider]    Allergies Fish-derived products  No family history on file.  Social History Social History   Tobacco Use  . Smoking status: Never Smoker  . Smokeless tobacco: Never Used  Substance Use Topics  . Alcohol use: No  . Drug use: No    Review of Systems  Constitutional: Negative for fever. Eyes: Negative for visual changes. ENT: Negative for sore throat. Cardiovascular: Negative for chest pain. Respiratory: Negative for shortness of breath. Gastrointestinal: Negative for abdominal pain, vomiting and diarrhea. Genitourinary: Negative for dysuria. Musculoskeletal: Negative for back pain. Skin: Positive for rash. Neurological: Negative for headaches, focal weakness or numbness. ____________________________________________  PHYSICAL EXAM:  VITAL SIGNS: ED Triage Vitals  Enc Vitals Group     BP 01/31/19 1657 (!) 117/34     Pulse Rate 01/31/19 1657 72     Resp 01/31/19 1657 16     Temp 01/31/19 1657 98.8 F (37.1 C)     Temp Source 01/31/19 1657 Oral     SpO2 01/31/19 1657 95 %     Weight  01/31/19 1702 189 lb (85.7 kg)     Height 01/31/19 1702 5\' 3"  (1.6 m)     Head Circumference --      Peak Flow --      Pain Score 01/31/19 1702 0     Pain Loc --      Pain Edu? --      Excl. in [place]? --     Constitutional: Alert and oriented. Well appearing and in no distress. Head: Normocephalic and atraumatic. Eyes: Conjunctivae are normal. Normal extraocular movements Nose: No congestion/rhinorrhea/epistaxis. Mouth/Throat: Mucous membranes are moist. Neck: Supple. No thyromegaly. Cardiovascular: Normal rate, regular rhythm. Normal distal pulses. Respiratory: Normal respiratory effort. No wheezes/rales/rhonchi. Musculoskeletal: Nontender with normal range of motion in all extremities.  Right knee with obvious midline surgical scar consistent with a recent total knee arthroplasty. Neurologic: Normal speech and language. No gross focal neurologic deficits are appreciated. Skin:  Skin is warm, dry and intact.  Patient with well demarcated area of pink to red  skin extending from the dorsal radial hand to the inner upper arm.  The area is slightly warm to touch but the skin is blanchable.  There appears to be some whelps in the same region in the inner upper arm. ____________________________________________   LABS (pertinent positives/negatives)  Labs Reviewed  CBC WITH DIFFERENTIAL/PLATELET - Abnormal; Notable for the following components:      Result Value   Hemoglobin 10.7 (*)    HCT 33.6 (*)    RDW 16.1 (*)    Platelets 432 (*)    All other components within normal limits  BASIC METABOLIC PANEL  ____________________________________________  PROCEDURES  Procedures Famotidine 20 mg IVP Decadron 10 mg IVP ____________________________________________  INITIAL IMPRESSION / ASSESSMENT AND PLAN / ED COURSE  MALLOREE RABOIN was evaluated in Emergency Department on 02/01/2019 for the symptoms described in the history of present illness. She was evaluated in the context of the  global COVID-19 pandemic, which necessitated consideration that the patient might be at risk for infection with the SARS-CoV-2 virus that causes COVID-19. Institutional protocols and algorithms that pertain to the evaluation of patients at risk for COVID-19 are in a state of rapid change based on information released by regulatory bodies including the CDC and federal and state organizations. These policies and algorithms were followed during the patient's care in the ED.  Patient with ED evaluation of sudden onset of pruritus and erythema to the right forearm and upper arm.  Patient clinical picture is concerning for cellulitis versus an allergic reaction.  Given the history of scattered intermittent whelps/hives in the same region, and the patient's subsequent response to IV histamine blockade, it appears her symptoms more likely consistent with allergic reaction of and unknown etiology.  Her labs are reassuring at this time as a showed no acute infectious process.  Patient is found to have some stable anemia and a mild thrombo-cytosis.  She is otherwise without any signs of acute inflammatory response syndrome, post-op infection, or acute bleeding disorder.  Patient and her daughter are advised to follow-up with the orthopedic surgeon or cardiac specialist for repeat labs given their concern for the mild thrombocytosis.  Patient will be discharged with prescription for famotidine as well as prednisone for continued antihistamine and anti-inflammatory benefit.  Return precautions have been reviewed and the patient and her daughter verbalized understanding. ____________________________________________  FINAL CLINICAL IMPRESSION(S) / ED DIAGNOSES  Final diagnoses:  Urticaria      Carmie End, Dannielle Karvonen, PA-C 02/01/19 0005    Earleen Newport, MD 02/01/19 [phone]

## 2019-01-31 NOTE — ED Notes (Addendum)
See triage note. Upon assessment, large erythematous patches extending up pts anterior arm from wrist to biceps. Areas are hot to the touch. Pts daughter states that they have expanded significantly since she was triaged. At time of triage, erythema extended only to the [facility] space. Pt is primarily a [place] language speaker and currently her daughter is with her to communicate.. Pt has Hx of R knee replacement 2 weeks ago. Pt A&Ox4, NAD, no respiratory distress noted.

## 2019-01-31 NOTE — ED Triage Notes (Signed)
Pt here with c/o redness and swelling to right wrist and forearm, first noticed some itching during the night last night on that arm, then worsening throughout the day, daughter said it has increased in size since they have checked in, denies pain. 2 weeks ago, pt had knee surgery at this hospital, was discharged on 01/18/19.

## 2019-02-01 MED ORDER — FAMOTIDINE 20 MG PO TABS: 20.0000 mg | ORAL_TABLET | Freq: Two times a day (BID) | ORAL | 0 refills | Status: DC

## 2019-02-01 MED ORDER — PREDNISONE 10 MG PO TABS: 10.0000 mg | ORAL_TABLET | Freq: Two times a day (BID) | ORAL | 0 refills | Status: DC

## 2019-02-01 NOTE — Discharge Instructions (Signed)
Cathy Spears has what appears to be a local allergic reaction and hives. The exact cause or trigger is not known. The symptoms should respond to the steroid and the antihistamine. You may take OTC Benadryl as needed for additional itch relief. Follow-up with your provider or specialist, or return as needed.

## 2019-02-01 NOTE — ED Notes (Signed)
E signature pad not working. Pt educated on discharge instructions and verbalized understanding.  

## 2019-02-24 ENCOUNTER — Ambulatory Visit
Admission: RE | Admit: 2019-02-24 | Discharge: 2019-02-24 | Disposition: A | Discharge status: Home / Self Care | Payer: Medicare HMO | Source: Ambulatory Visit | Attending: Hematology and Oncology | Admitting: Hematology and Oncology

## 2019-02-24 ENCOUNTER — Other Ambulatory Visit: Payer: Self-pay

## 2019-02-24 ENCOUNTER — Other Ambulatory Visit: Payer: Self-pay | Admitting: Internal Medicine

## 2019-02-24 DIAGNOSIS — Z1231 Encounter for screening mammogram for malignant neoplasm of breast: Secondary | ICD-10-CM

## 2019-07-24 IMAGING — CR DG NECK SOFT TISSUE
1 series · 2 of 2 positions shown · non-contrast
Comparison: None.

CLINICAL DATA: Globus sensation in the throat.

EXAM:
NECK SOFT TISSUES - 1+ VIEW

[Series 1: dg neck soft tissue · 0.14mm/px · 2 of 2 slices shown]
[im 1/2]
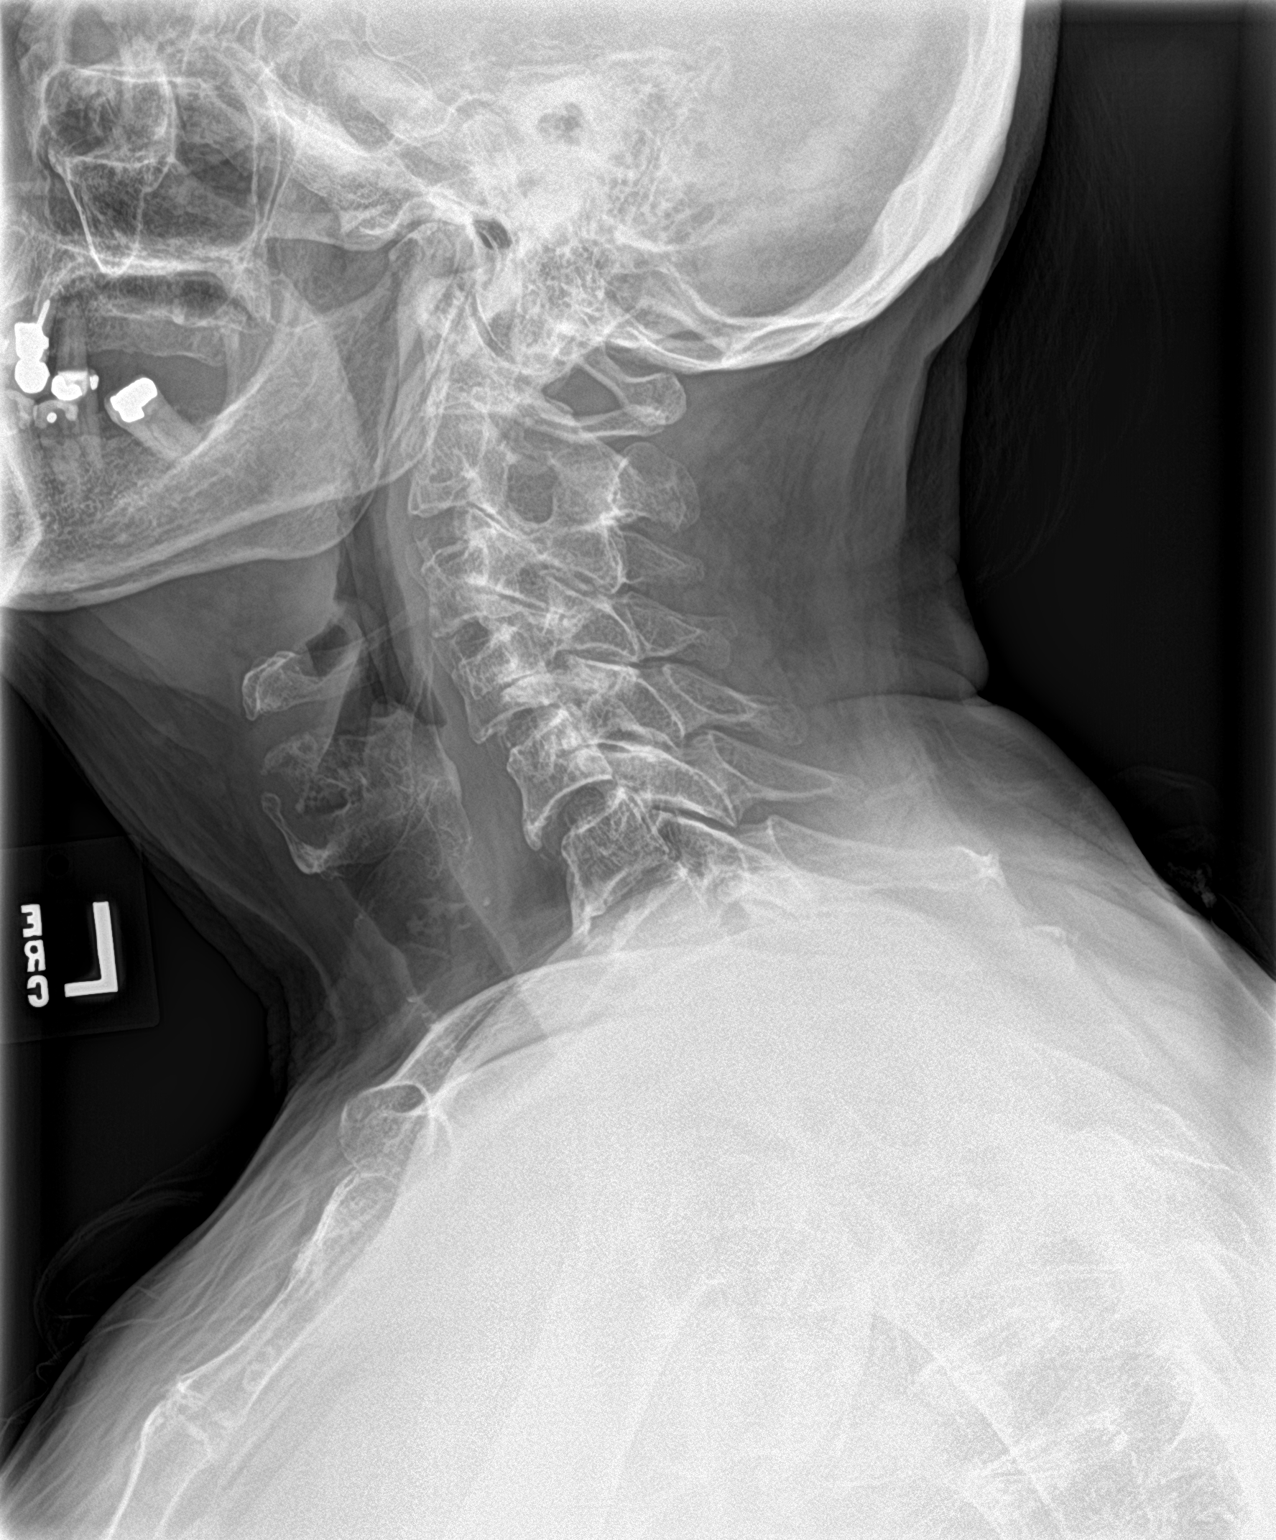
[im 2/2]
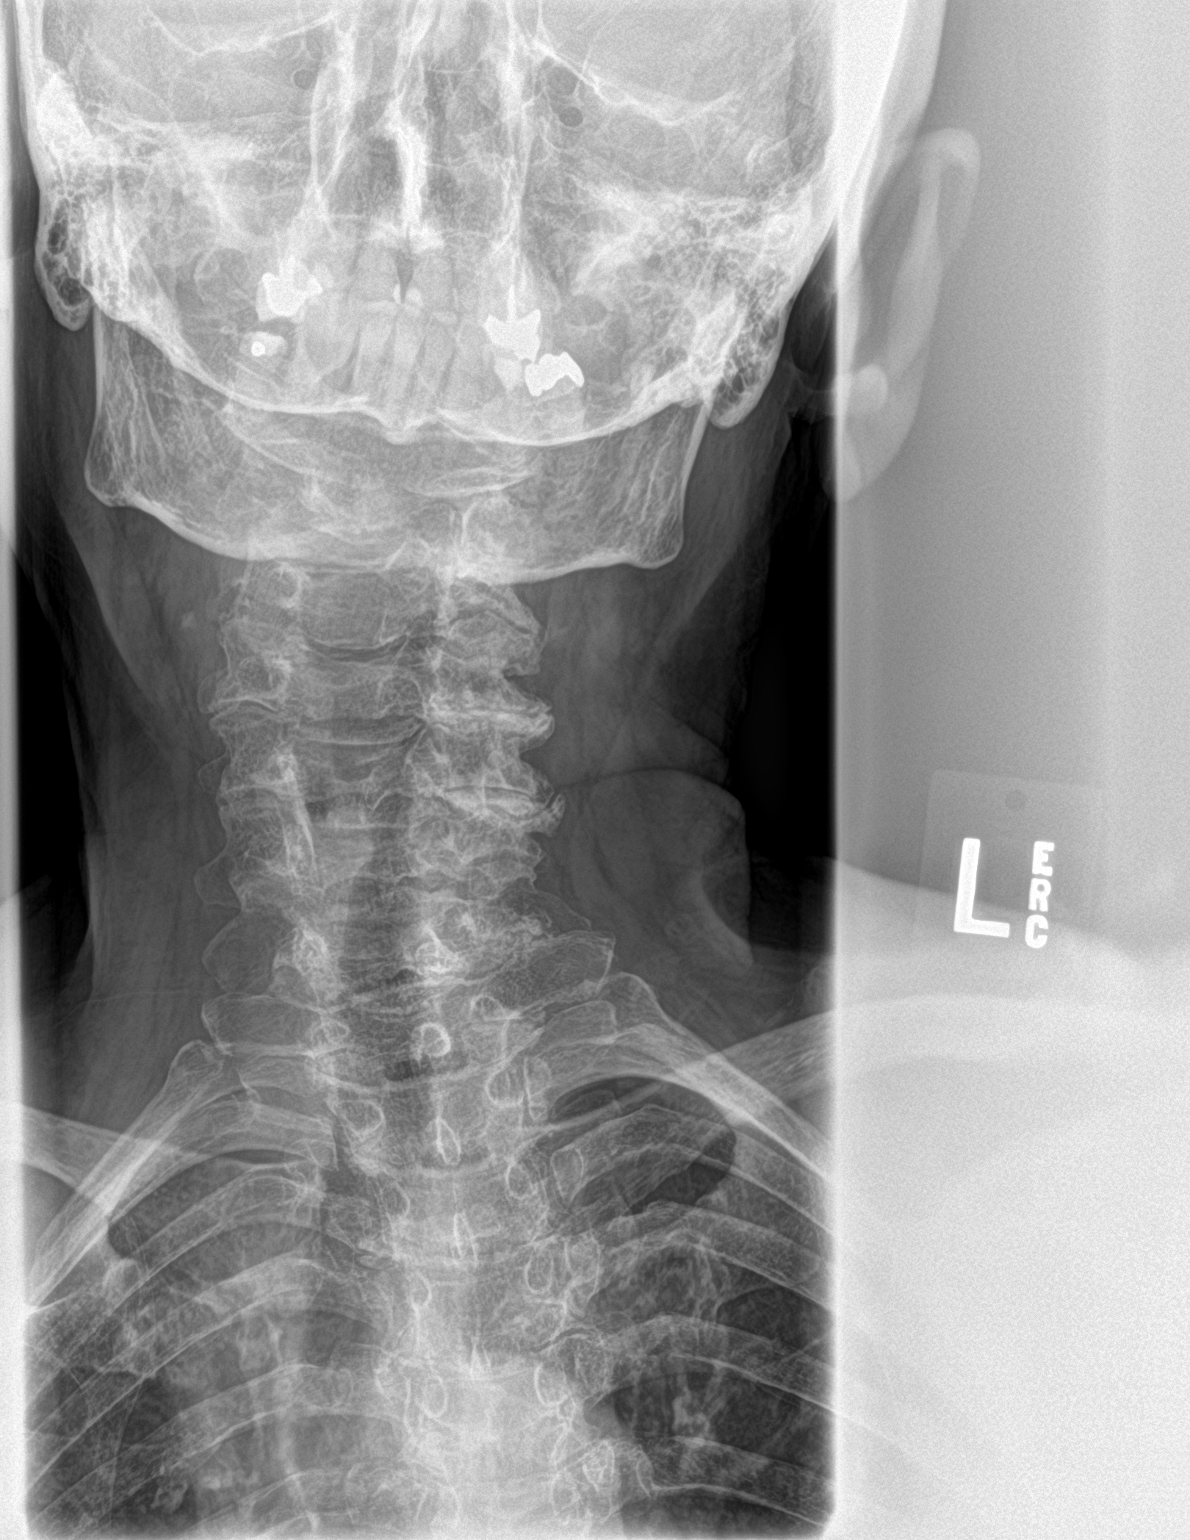

[2 of 2 positions shown; findings below may reference images not displayed]

FINDINGS: Prominent anterior osteophytes are noted at C5 off the inferior
endplate and more prominently anteriorly across the anterior aspect
of the C6-7 interspace. These osteophytes may cause extrinsic mass
effect on the adjacent esophagus and may cause the globus sensation
when ingesting food boluses. Otherwise, there is no prevertebral
soft tissue swelling or prominence. The airways patent. No acute
cervical spine fracture or listhesis.
IMPRESSION: Prominent anterior osteophytes at C5, C6 and C7 which may cause
extrinsic mass effect on the adjacent lower cervical esophagus.

## 2019-07-24 IMAGING — CR DG CHEST 2V
2 series · 2 of 2 positions shown · non-contrast
Comparison: Chest radiograph 08/19/2011

CLINICAL DATA: Patient with shortness of breath.

EXAM:
CHEST - 2 VIEW

[chest pa]
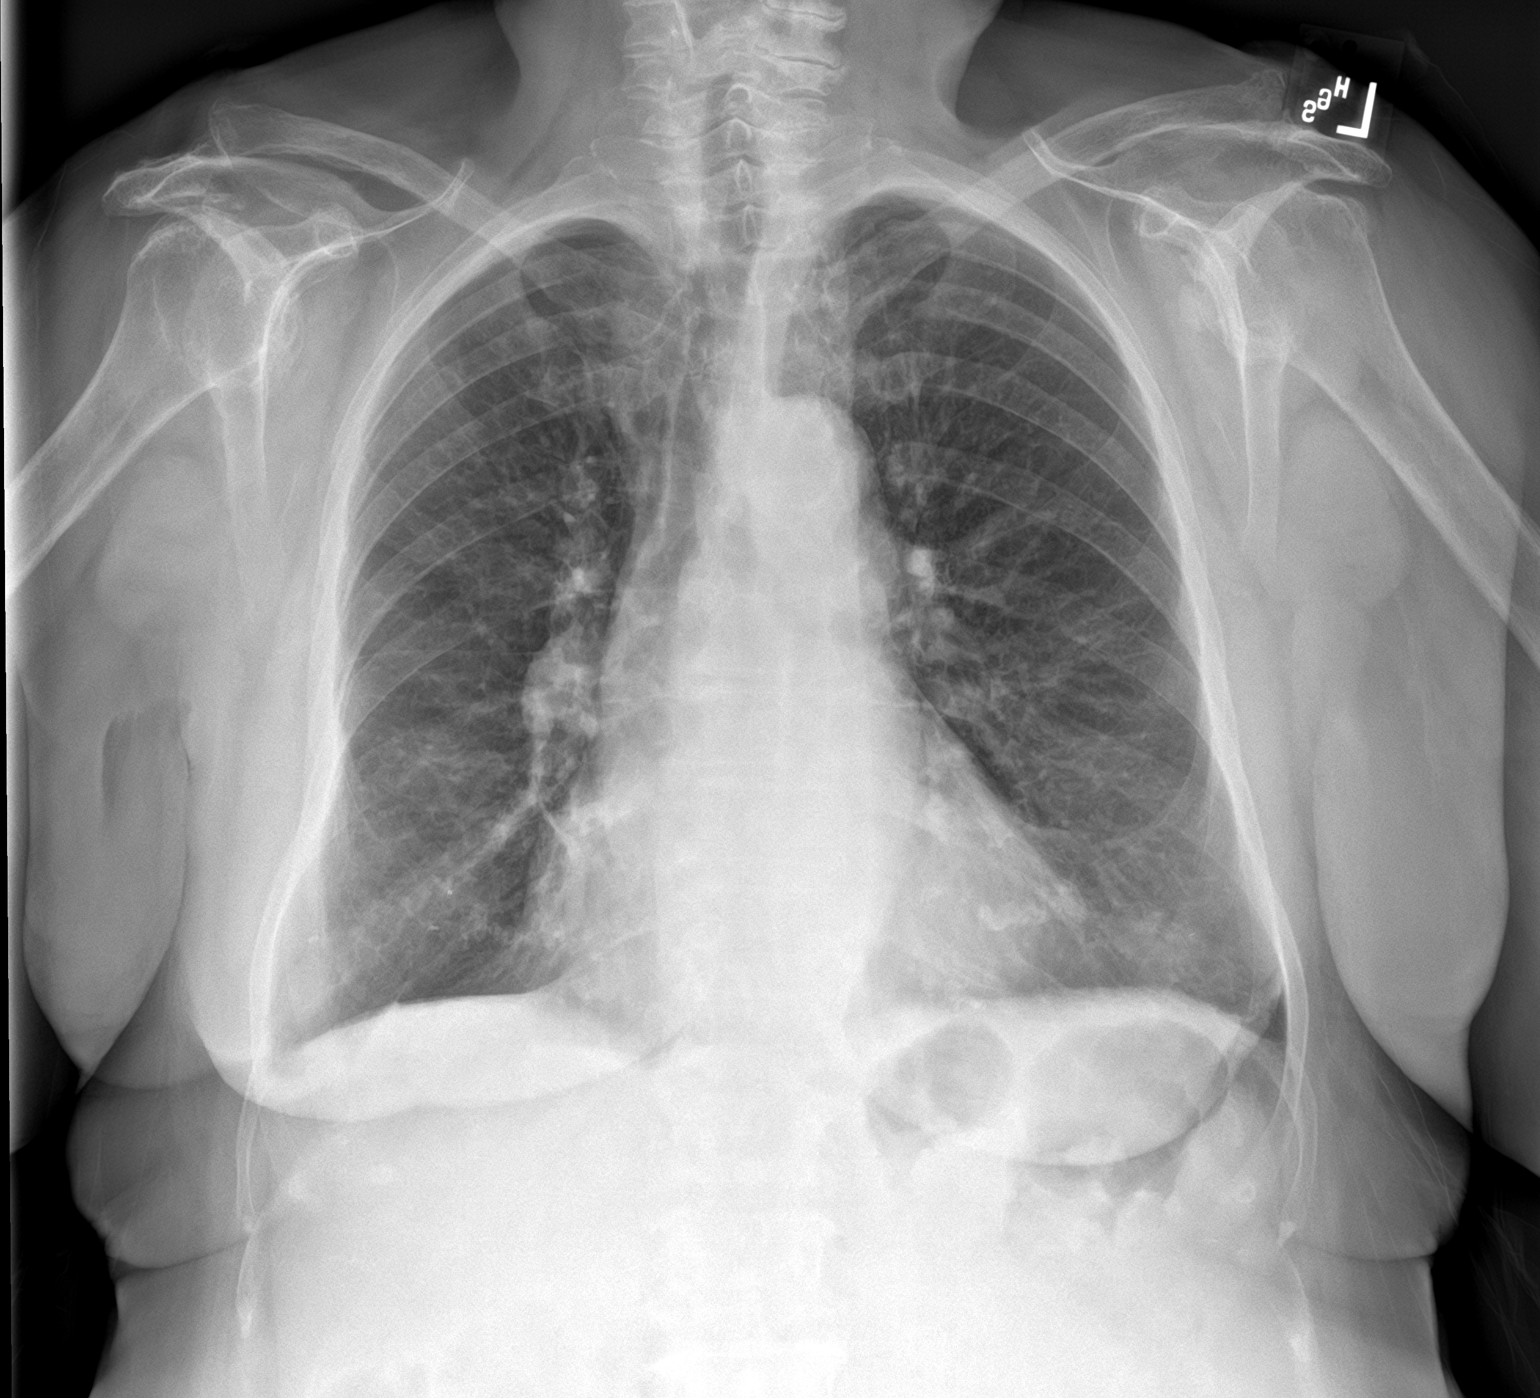

[chest lat]
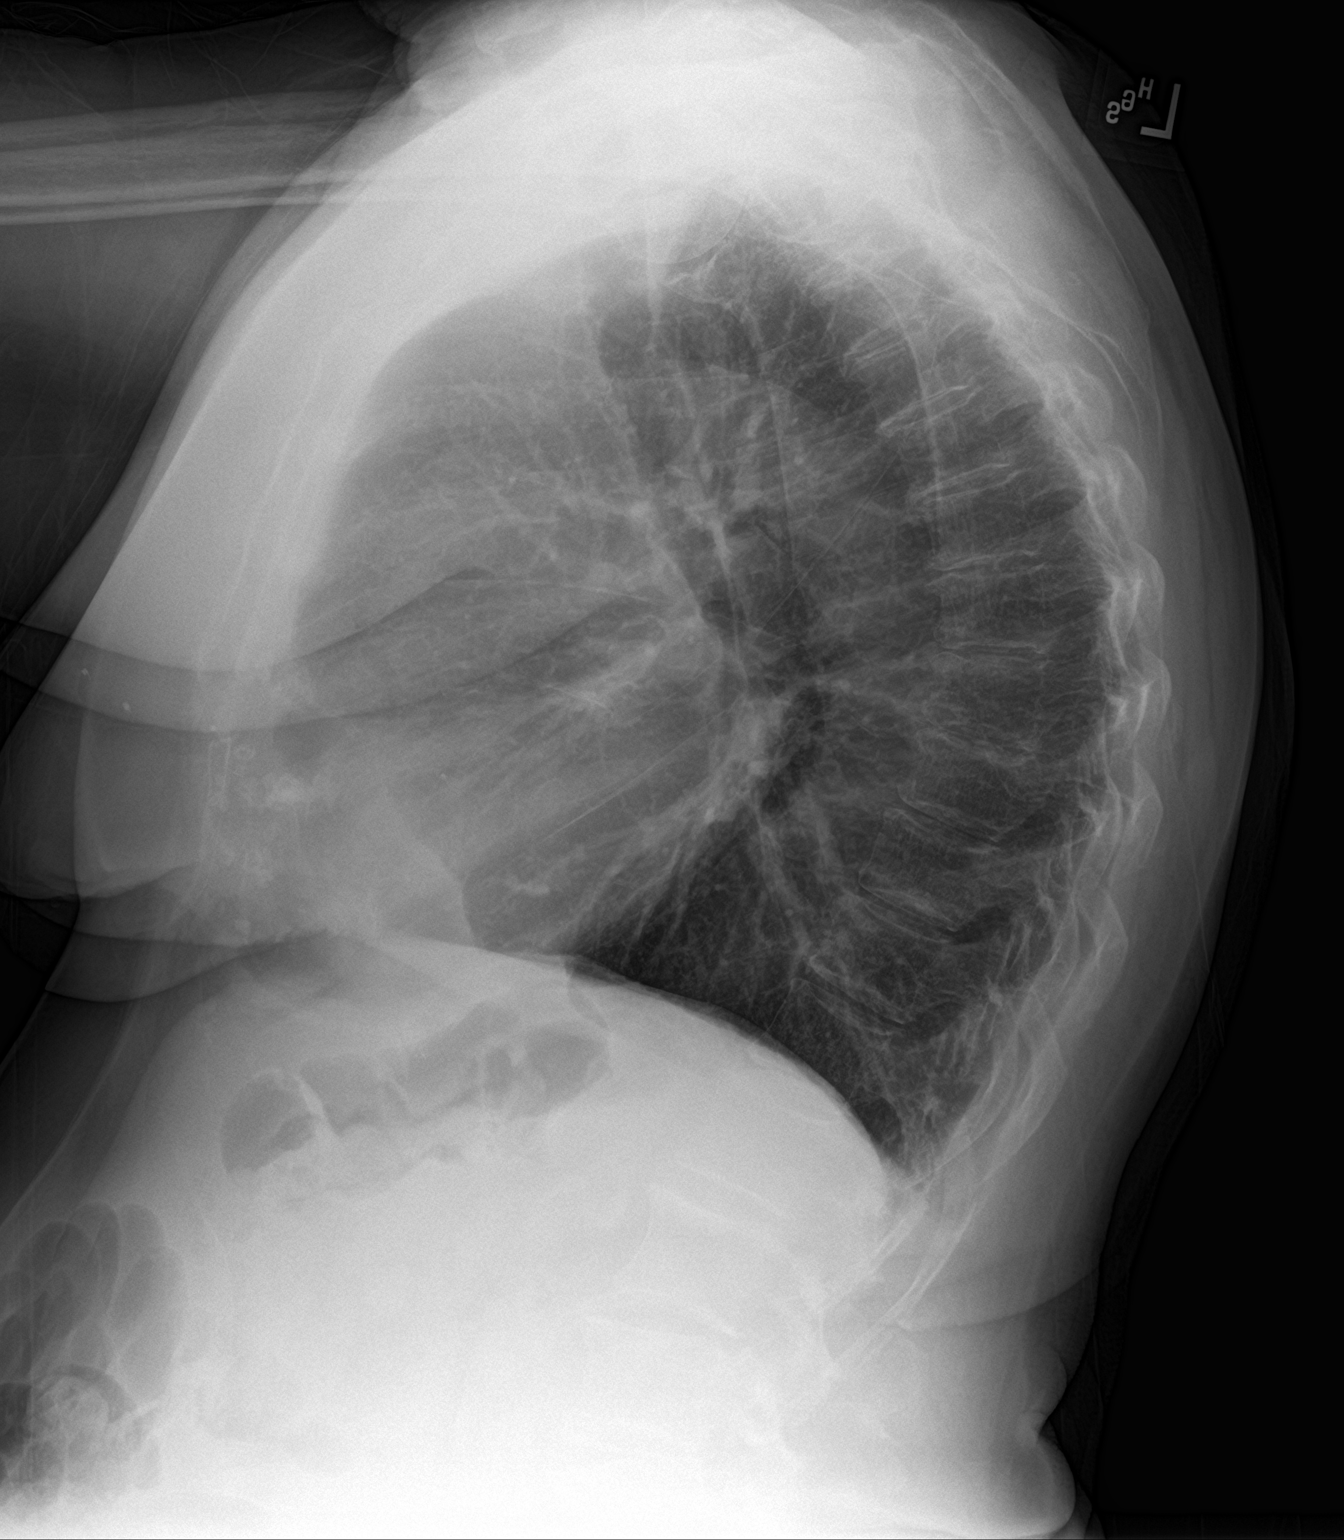

[2 of 2 positions shown; findings below may reference images not displayed]

FINDINGS: Stable cardiac and mediastinal contours. Left lower lobe
heterogeneous opacities, probable atelectasis. No pleural effusion
or pneumothorax. Thoracic spine degenerative changes.
IMPRESSION: No acute cardiopulmonary process.  Pulmonary hyperinflation.

## 2020-01-19 ENCOUNTER — Other Ambulatory Visit: Payer: Self-pay | Admitting: Internal Medicine

## 2020-01-19 DIAGNOSIS — Z1231 Encounter for screening mammogram for malignant neoplasm of breast: Secondary | ICD-10-CM

## 2020-03-02 ENCOUNTER — Other Ambulatory Visit: Payer: Self-pay

## 2020-03-02 ENCOUNTER — Ambulatory Visit
Admission: RE | Admit: 2020-03-02 | Discharge: 2020-03-02 | Disposition: A | Discharge status: Home / Self Care | Payer: Medicare HMO | Source: Ambulatory Visit | Attending: Internal Medicine | Admitting: Internal Medicine

## 2020-03-02 DIAGNOSIS — Z1231 Encounter for screening mammogram for malignant neoplasm of breast: Secondary | ICD-10-CM

## 2020-05-12 IMAGING — DX PORTABLE CHEST - 1 VIEW
1 series · 1 of 1 positions shown · non-contrast
Comparison: Radiographs 03/27/2018

CLINICAL DATA: Dyspnea.

EXAM:
PORTABLE CHEST 1 VIEW

[chest ap]
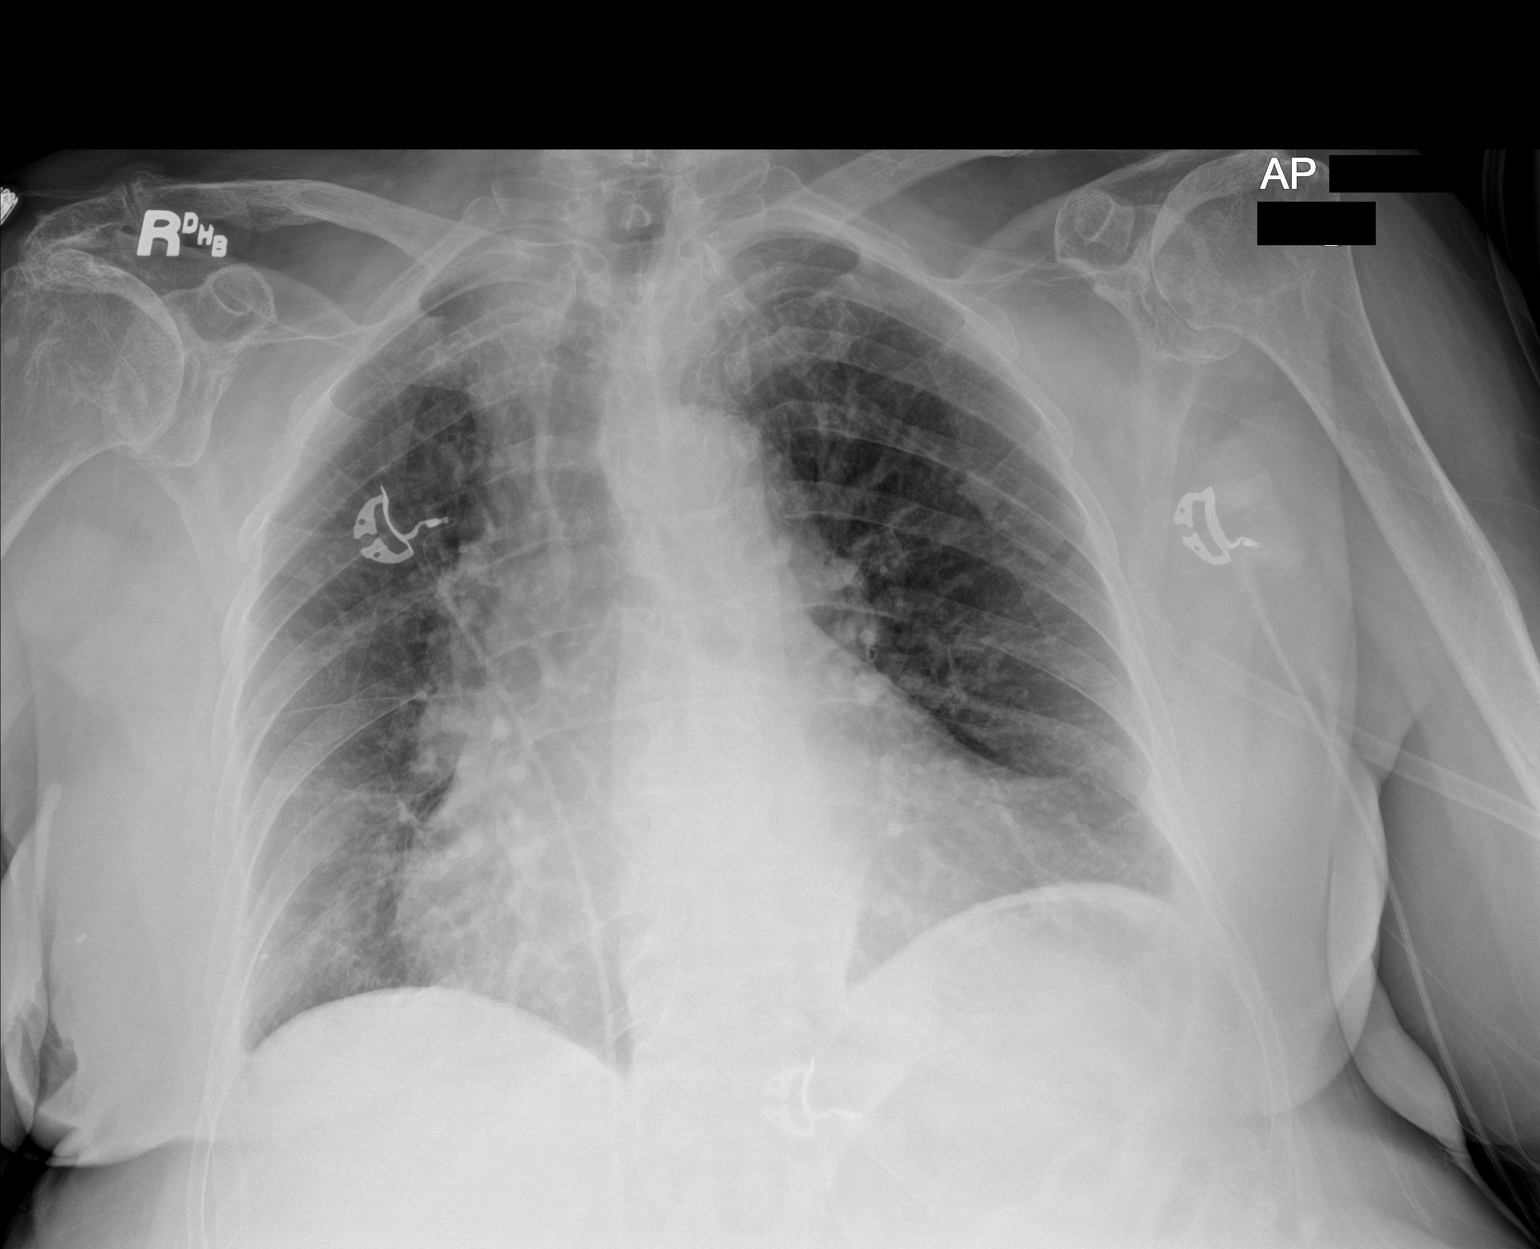

[1 of 1 positions shown; findings below may reference images not displayed]

FINDINGS: Mild cardiomegaly. Tortuous atherosclerotic thoracic aorta. Possible
vascular congestion without pulmonary edema. Chronic blunting of the
right costophrenic angle. No large effusion. No focal airspace
disease or pneumothorax. Degenerative change of the shoulders.
IMPRESSION: 1. Mild cardiomegaly, similar to prior allowing for differences in
technique.
2. Possible vascular congestion.

## 2020-05-23 IMAGING — US RIGHT LOWER EXTREMITY VENOUS ULTRASOUND
1 series · 13 of 24 positions shown · non-contrast
Comparison: None.

CLINICAL DATA: Right lower extremity pain and edema. History of
right total knee replacement on 01/14/2019. History of breast
cancer. Evaluate for DVT.



[Series 1: right lower extremity venous ultrasound · 0.08mm/px · 13 of 42 slices shown]
[im 1/42]
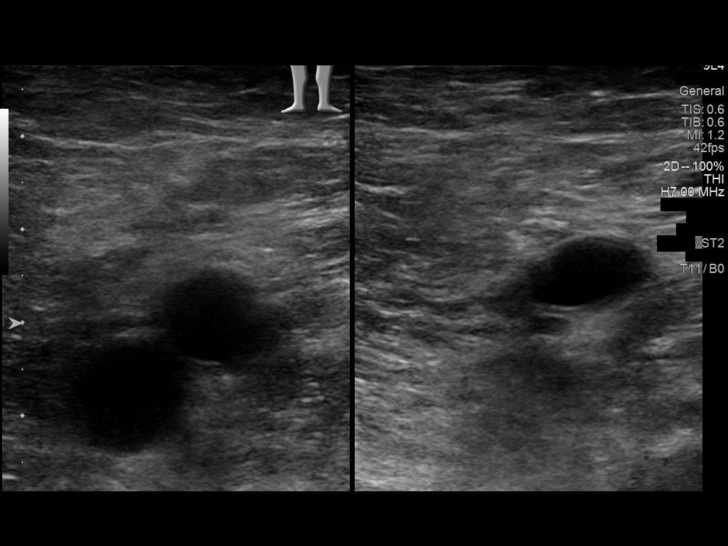
[im 4/42]
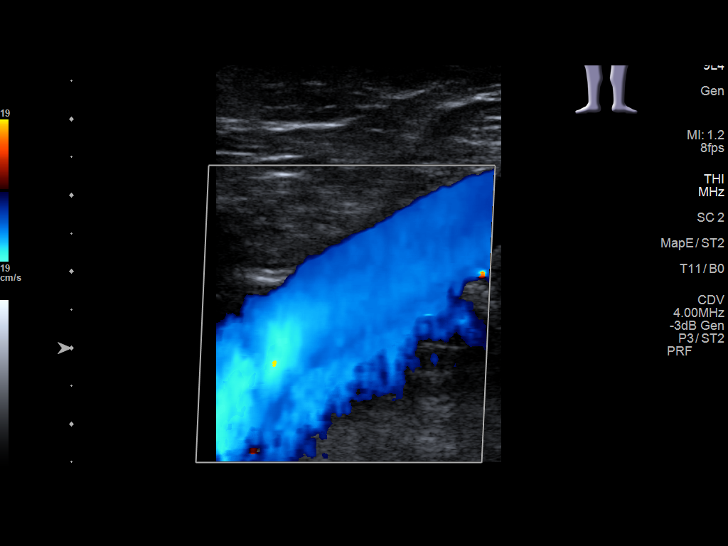
[im 8/42]
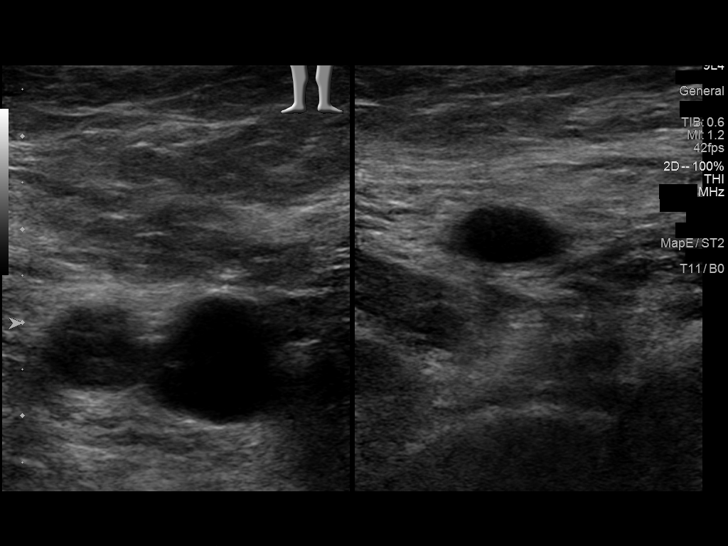
[im 11/42]
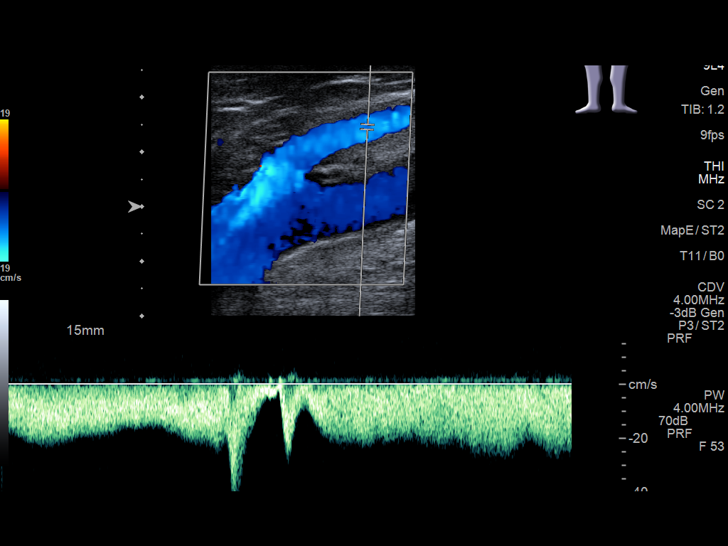
[im 15/42]
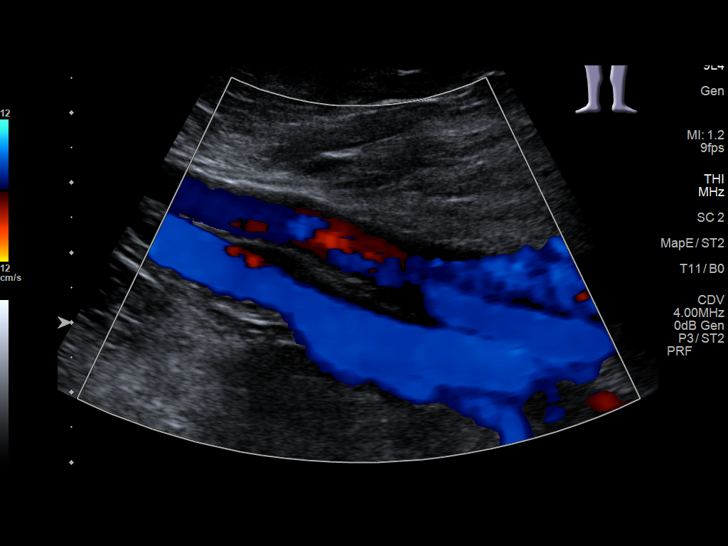
[im 18/42]
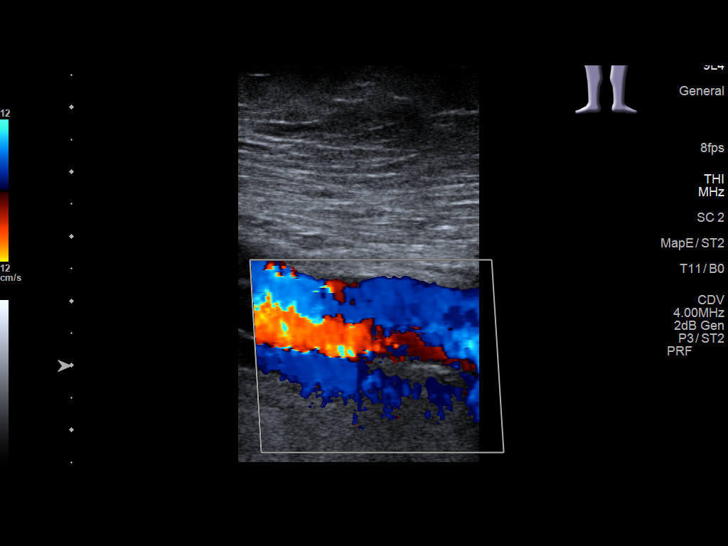
[im 22/42]
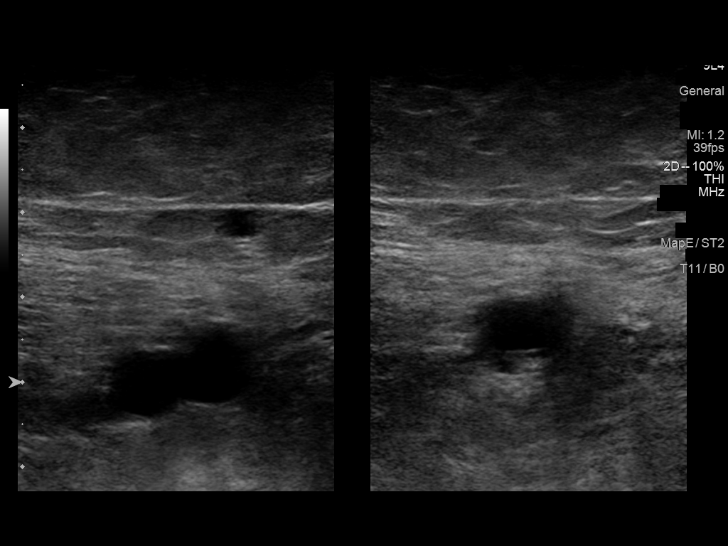
[im 24/42]
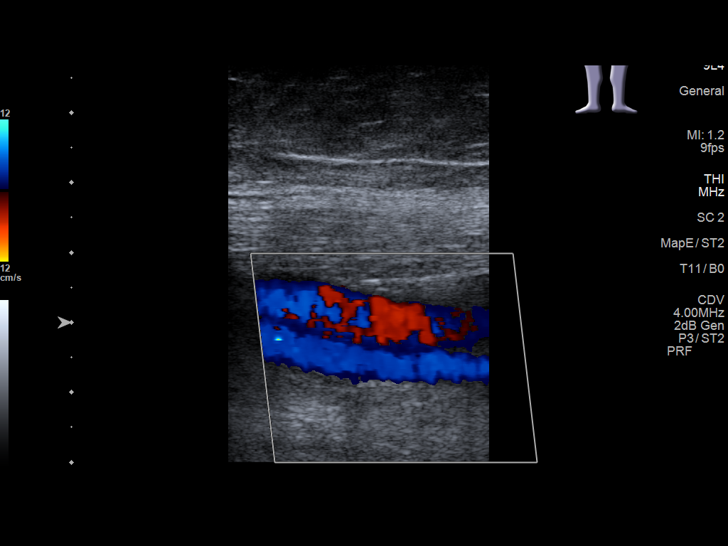
[im 27/42]
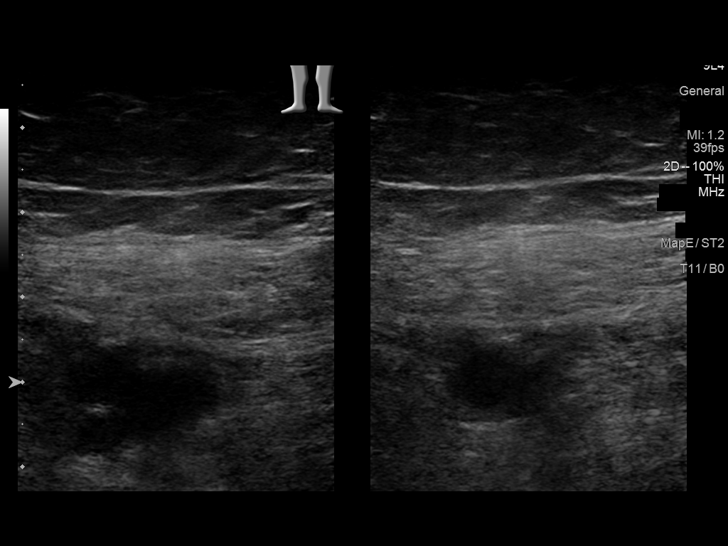
[im 31/42]
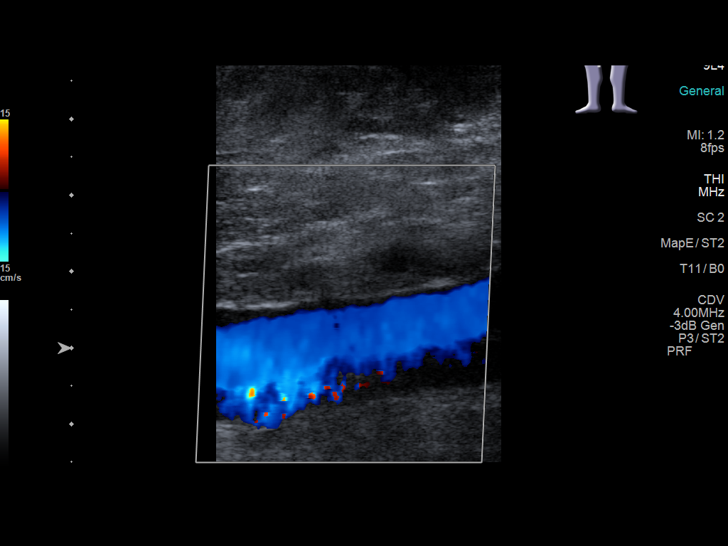
[im 34/42]
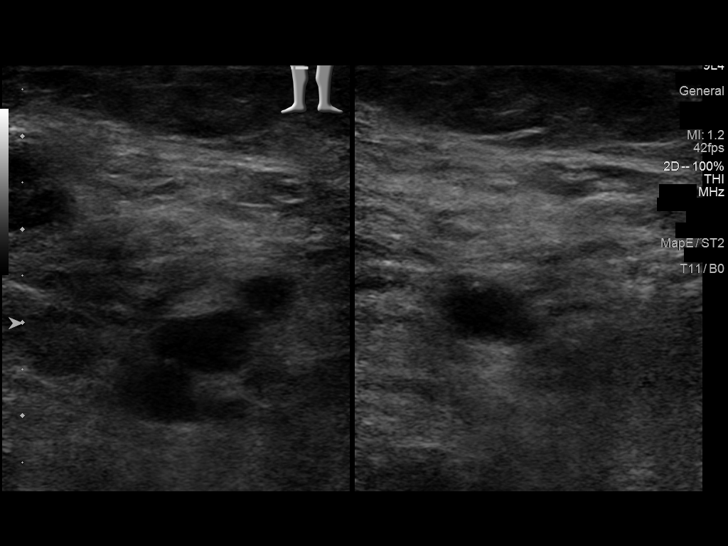
[im 38/42]
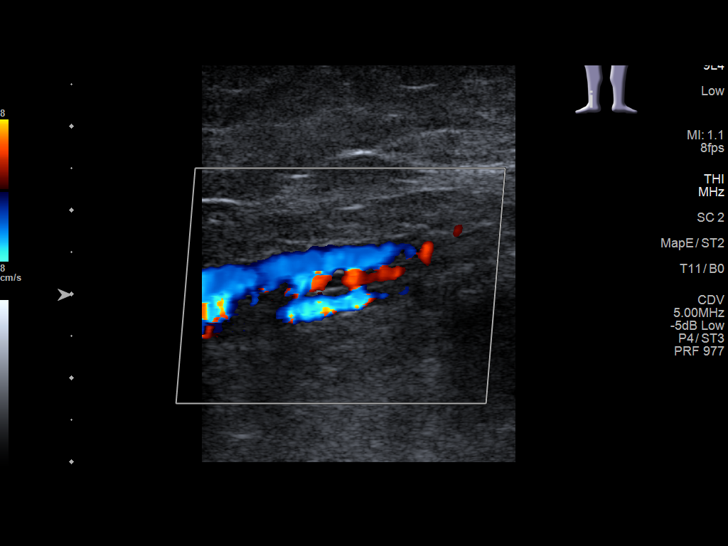
[im 42/42]
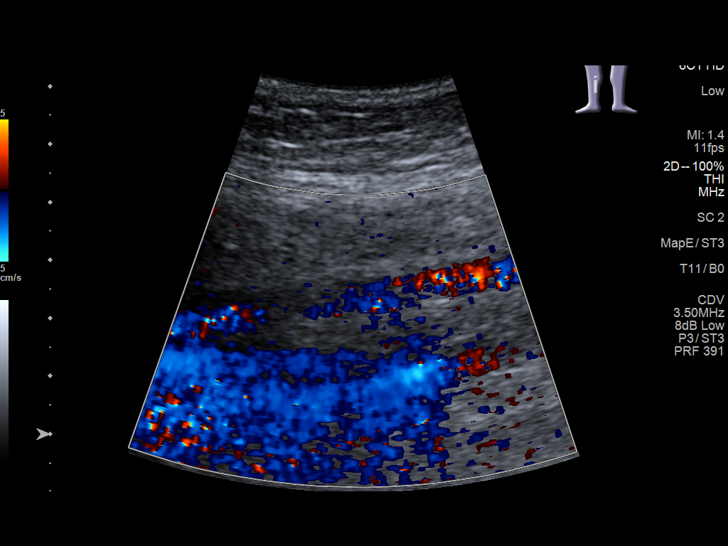

[13 of 24 positions shown; findings below may reference images not displayed]

FINDINGS: Contralateral Common Femoral Vein: Respiratory phasicity is normal
and symmetric with the symptomatic side. No evidence of thrombus.
Normal compressibility.

Common Femoral Vein: No evidence of thrombus. Normal
compressibility, respiratory phasicity and response to augmentation.

Saphenofemoral Junction: No evidence of thrombus. Normal
compressibility and flow on color Doppler imaging.

Profunda Femoral Vein: No evidence of thrombus. Normal
compressibility and flow on color Doppler imaging.

Femoral Vein: No evidence of thrombus. Normal compressibility,
respiratory phasicity and response to augmentation.

Popliteal Vein: No evidence of thrombus. Normal compressibility,
respiratory phasicity and response to augmentation.

Calf Veins: No evidence of thrombus. Normal compressibility and flow
on color Doppler imaging.

Superficial Great Saphenous Vein: No evidence of thrombus. Normal
compressibility.

Venous Reflux:  None.

Other Findings:  None.
IMPRESSION: No evidence of DVT within the right lower extremity.

## 2021-01-22 ENCOUNTER — Other Ambulatory Visit: Payer: Self-pay | Admitting: Internal Medicine

## 2021-01-22 DIAGNOSIS — Z1231 Encounter for screening mammogram for malignant neoplasm of breast: Secondary | ICD-10-CM

## 2021-03-15 ENCOUNTER — Ambulatory Visit: Payer: Medicare HMO

## 2021-03-22 ENCOUNTER — Ambulatory Visit: Payer: Medicare HMO

## 2021-03-24 ENCOUNTER — Ambulatory Visit
Admission: RE | Admit: 2021-03-24 | Discharge: 2021-03-24 | Disposition: A | Discharge status: Home / Self Care | Payer: Medicare HMO | Source: Ambulatory Visit | Attending: Internal Medicine | Admitting: Internal Medicine

## 2021-03-24 DIAGNOSIS — Z1231 Encounter for screening mammogram for malignant neoplasm of breast: Secondary | ICD-10-CM

## 2021-03-30 ENCOUNTER — Other Ambulatory Visit: Payer: Self-pay | Admitting: Internal Medicine

## 2021-03-30 DIAGNOSIS — R928 Other abnormal and inconclusive findings on diagnostic imaging of breast: Secondary | ICD-10-CM

## 2021-04-09 ENCOUNTER — Ambulatory Visit: Payer: Medicare HMO

## 2021-04-09 ENCOUNTER — Other Ambulatory Visit: Payer: Self-pay

## 2021-04-09 ENCOUNTER — Ambulatory Visit
Admission: RE | Admit: 2021-04-09 | Discharge: 2021-04-09 | Disposition: A | Discharge status: Home / Self Care | Payer: Medicare HMO | Source: Ambulatory Visit | Attending: Internal Medicine | Admitting: Internal Medicine

## 2021-04-09 DIAGNOSIS — R928 Other abnormal and inconclusive findings on diagnostic imaging of breast: Secondary | ICD-10-CM

## 2021-04-13 ENCOUNTER — Other Ambulatory Visit: Payer: Medicare HMO

## 2021-04-16 ENCOUNTER — Other Ambulatory Visit: Payer: Medicare HMO

## 2022-01-26 ENCOUNTER — Encounter: Payer: Self-pay | Admitting: Emergency Medicine

## 2022-01-26 ENCOUNTER — Emergency Department: Payer: Medicare HMO

## 2022-01-26 ENCOUNTER — Other Ambulatory Visit: Payer: Self-pay

## 2022-01-26 ENCOUNTER — Emergency Department
Admission: EM | Admit: 2022-01-26 | Discharge: 2022-01-26 | Disposition: A | Discharge status: Home / Self Care | Payer: Medicare HMO | Source: Home / Self Care | Attending: Emergency Medicine | Admitting: Emergency Medicine

## 2022-01-26 DIAGNOSIS — R051 Acute cough: Secondary | ICD-10-CM | POA: Insufficient documentation

## 2022-01-26 DIAGNOSIS — R059 Cough, unspecified: Secondary | ICD-10-CM | POA: Diagnosis not present

## 2022-01-26 DIAGNOSIS — I10 Essential (primary) hypertension: Secondary | ICD-10-CM | POA: Insufficient documentation

## 2022-01-26 DIAGNOSIS — R7989 Other specified abnormal findings of blood chemistry: Secondary | ICD-10-CM | POA: Insufficient documentation

## 2022-01-26 DIAGNOSIS — R0602 Shortness of breath: Secondary | ICD-10-CM | POA: Insufficient documentation

## 2022-01-26 DIAGNOSIS — I110 Hypertensive heart disease with heart failure: Secondary | ICD-10-CM | POA: Diagnosis not present

## 2022-01-26 LAB — CBC WITH DIFFERENTIAL/PLATELET
Abs Immature Granulocytes: 0.03 10*3/uL (ref 0.00–0.07)
Basophils Absolute: 0 10*3/uL (ref 0.0–0.1)
Basophils Relative: 1 %
Eosinophils Absolute: 0.1 10*3/uL (ref 0.0–0.5)
Eosinophils Relative: 2 %
HCT: 37.5 % (ref 36.0–46.0)
Hemoglobin: 11.8 g/dL — ABNORMAL LOW (ref 12.0–15.0)
Immature Granulocytes: 1 %
Lymphocytes Relative: 16 %
Lymphs Abs: 0.7 10*3/uL (ref 0.7–4.0)
MCH: 26.1 pg (ref 26.0–34.0)
MCHC: 31.5 g/dL (ref 30.0–36.0)
MCV: 83 fL (ref 80.0–100.0)
Monocytes Absolute: 0.6 10*3/uL (ref 0.1–1.0)
Monocytes Relative: 14 %
Neutro Abs: 2.8 10*3/uL (ref 1.7–7.7)
Neutrophils Relative %: 66 %
Platelets: 204 10*3/uL (ref 150–400)
RBC: 4.52 MIL/uL (ref 3.87–5.11)
RDW: 15.8 % — ABNORMAL HIGH (ref 11.5–15.5)
WBC: 4.1 10*3/uL (ref 4.0–10.5)
nRBC: 0 % (ref 0.0–0.2)

## 2022-01-26 LAB — COMPREHENSIVE METABOLIC PANEL
ALT: 16 U/L (ref 0–44)
AST: 23 U/L (ref 15–41)
Albumin: 3.9 g/dL (ref 3.5–5.0)
Alkaline Phosphatase: 67 U/L (ref 38–126)
Anion gap: 9 (ref 5–15)
BUN: 18 mg/dL (ref 8–23)
CO2: 24 mmol/L (ref 22–32)
Calcium: 9.1 mg/dL (ref 8.9–10.3)
Chloride: 104 mmol/L (ref 98–111)
Creatinine, Ser: 0.77 mg/dL (ref 0.44–1.00)
GFR, Estimated: >60 mL/min (ref >60)
Glucose, Bld: 106 mg/dL — ABNORMAL HIGH (ref 70–99)
Potassium: 3.8 mmol/L (ref 3.5–5.1)
Sodium: 137 mmol/L (ref 135–145)
Total Bilirubin: 0.6 mg/dL (ref 0.3–1.2)
Total Protein: 6.9 g/dL (ref 6.5–8.1)

## 2022-01-26 LAB — TROPONIN I (HIGH SENSITIVITY): Troponin I (High Sensitivity): 9 ng/L (ref <18)

## 2022-01-26 LAB — BRAIN NATRIURETIC PEPTIDE: B Natriuretic Peptide: 168.5 pg/mL — ABNORMAL HIGH (ref 0.0–100.0)

## 2022-01-26 MED ORDER — POTASSIUM CHLORIDE CRYS ER 20 MEQ PO TBCR: EXTENDED_RELEASE_TABLET | ORAL | 0 refills | Status: DC

## 2022-01-26 MED ORDER — FUROSEMIDE 20 MG PO TABS: ORAL_TABLET | ORAL | 0 refills | Status: DC

## 2022-01-26 MED ORDER — FUROSEMIDE 10 MG/ML IJ SOLN
20.0000 mg | Freq: Once | INTRAMUSCULAR | Status: AC
  Administered 2022-01-26: 20 mg via INTRAVENOUS
  Filled 2022-01-26: qty 4

## 2022-01-26 MED ORDER — FUROSEMIDE 10 MG/ML IJ SOLN: 40.0000 mg | Freq: Once | INTRAMUSCULAR | Status: DC

## 2022-01-26 NOTE — ED Triage Notes (Signed)
Pt via POV from home. Pt c/o dry cough that started last weekend. States that last couple of days she has been coughing non-stop.Denies any chest pain. Denies any SOB. Denies fever. Pt is A&OX4 and NAD

## 2022-01-26 NOTE — ED Notes (Signed)
Provider at bedside examining pt.

## 2022-01-26 NOTE — Discharge Instructions (Signed)
For your cough:  I suspect some, if not most, of this is related to fluid buildup (edema).  For the next 3 days: - Take Lasix 40 mg in the mornings. This is 2 of your 20 mg tablets. I've prescribed additional tablets if needed. - Take the Potassium supplement with the lasix whenever you take it.  Follow-up with your primary doctor in 3-5 days for repeat labs, check-up, and possible referral for echocardiogram/cardiac evaluation.  Continue your antacid twice daily.  I also called and discussed with Dr. Vicente Males of GI - call the office and let them know your case was discussed with Dr. Vicente Males today in the ER, and that you should be scheduled for a more urgent follow-up.  Avoid foods high in sodium/salt.

## 2022-01-26 NOTE — ED Notes (Signed)
Pt is not yet in room.

## 2022-01-26 NOTE — ED Provider Notes (Signed)
[facility] Provider Note    Event Date/Time   First MD Initiated Contact with Patient 01/26/22 1353     (approximate)   History   Cough   HPI  Cathy Spears is a 86 y.o. female with past medical history of A-fib, hypertension, leg edema on Lasix, here with increasing swelling of her legs, shortness of breath, and episodes of dyspnea.  The patient reportedly has had significant increase in her bilateral lower extremity swelling for the last week.  She has also noticed increasing shortness of breath and cough, as well as having to sit more more upright to help with this.  She has episodes, particularly after lying flat, where she feels like she cannot breathe, starts coughing, and feels like she cannot catch her breath.  These generally resolve when she sits up and tries to breathe slowly.  She has a history of chronic cough as well as chronic aspiration and reportedly has required esophageal dilation for this in the past.  It has been several years.  She tried to call her PCP and get set up with a GI follow-up at this was unable to be arranged as she had not seen GI in several years.  She denies any significant difficulty or pain with swallowing.  Denies any medication changes.  She is been taking her antacids.     Physical Exam   Triage Vital Signs: ED Triage Vitals  Enc Vitals Group     BP 01/26/22 1337 116/64     Pulse Rate 01/26/22 1337 88     Resp 01/26/22 1337 18     Temp 01/26/22 1337 98.2 F (36.8 C)     Temp Source 01/26/22 1337 Oral     SpO2 01/26/22 1337 95 %     Weight 01/26/22 1338 185 lb (83.9 kg)     Height 01/26/22 1338 '5\' 2"'$  (1.575 m)     Head Circumference --      Peak Flow --      Pain Score 01/26/22 1338 0     Pain Loc --      Pain Edu? --      Excl. in [place]? --     Most recent vital signs: Vitals:   01/26/22 1337  BP: 116/64  Pulse: 88  Resp: 18  Temp: 98.2 F (36.8 C)  SpO2: 95%     General: Awake, no distress.   CV:  Good peripheral perfusion.  Regular rate and rhythm.  No murmurs. Resp:  Normal effort.  Mild rales noted.  Normal work of breathing.  No wheezes.  No rhonchi. Abd:  No distention.  No tenderness. Other:  1+ pitting edema bilateral lower extremities.   ED Results / Procedures / Treatments   Labs (all labs ordered are listed, but only abnormal results are displayed) Labs Reviewed  CBC WITH DIFFERENTIAL/PLATELET - Abnormal; Notable for the following components:      Result Value   Hemoglobin 11.8 (*)    RDW 15.8 (*)    All other components within normal limits  COMPREHENSIVE METABOLIC PANEL - Abnormal; Notable for the following components:   Glucose, Bld 106 (*)    All other components within normal limits  BRAIN NATRIURETIC PEPTIDE - Abnormal; Notable for the following components:   B Natriuretic Peptide 168.5 (*)    All other components within normal limits  TROPONIN I (HIGH SENSITIVITY)     RADIOLOGY CXR: Clear, increased interstitial markings but no focal consolidation  I also independently reviewed and agree with radiologist interpretations.   PROCEDURES:  Critical Care performed: No   MEDICATIONS ORDERED IN ED: Medications  furosemide (LASIX) injection 20 mg (20 mg Intravenous Given 01/26/22 1555)     IMPRESSION / MDM / ASSESSMENT AND PLAN / ED COURSE  I reviewed the triage vital signs and the nursing notes.               Ddx:  Differential includes the following, with pertinent life- or limb-threatening emergencies considered:  CHF exacerbation/pulmonary edema, worsening reflux/silent aspiration, recurrent esophageal stricture, laryngospasm, PNA, PTX, pleural effusions, neurogenic coughing/choking  Patient's presentation is most consistent with acute presentation with potential threat to life or bodily function.  MDM:  86 yo F with h/o AFib, HTN, leg edema on Lasix, here with swelling of legs, cough. Suspect mild CHF/pulm edema. Pt has had pitting  edema in b/l legs, orthopnea, and h/o edema on Lasix. Last TTE in 2020 was unremarkable however. DDx includes worsening reflux/chronic aspiratoin with chronic cough, though the positionality and leg edema is more c/w edema. CXR is clear and no focal lung findings on exam. No sputum production, fevers, or signs to suggest PNA or aspiration PNA. No unilateral leg swelling, hypoxia, or signs of PE. Labs obtained and reviewed as above. No significant leukocytosis or anemia. CMP with normal renal function. BNP elevated at 170, c/w mild edema/CHF. Trop negative, no CP or signs of ischemia.  Will increase her lasix, refer her to PCP for likely outpt echo and follow-up. K given with her increased lasix. Suspect she may have mild dCHF. She does have a h/o AFib as well. Will also refer her for more urgent GI f/u given her history -called and discussed case with Dr. Vicente Males of GI, who has asked pt to call office. Numbers provided.   MEDICATIONS GIVEN IN ED: Medications  furosemide (LASIX) injection 20 mg (20 mg Intravenous Given 01/26/22 1555)     Consults:  Case discussed w/ GI to arrange f/u   EMR reviewed  Prior TTE 2020, prior GI/gastro notes      FINAL CLINICAL IMPRESSION(S) / ED DIAGNOSES   Final diagnoses:  Acute cough  Elevated brain natriuretic peptide (BNP) level     Rx / DC Orders   ED Discharge Orders          Ordered    potassium chloride SA (KLOR-CON M) 20 MEQ tablet        01/26/22 1612    furosemide (LASIX) 20 MG tablet  Multiple Frequencies        01/26/22 1612             Note:  This document was prepared using Dragon voice recognition software and may include unintentional dictation errors.   Duffy Bruce, MD 01/26/22 [phone]

## 2022-01-27 ENCOUNTER — Emergency Department: Payer: Medicare HMO

## 2022-01-27 ENCOUNTER — Inpatient Hospital Stay
Admission: EM | Admit: 2022-01-27 | Discharge: 2022-01-29 | DRG: 291 | Disposition: A | Discharge status: Home / Self Care | Payer: Medicare HMO | Attending: Internal Medicine | Admitting: Internal Medicine

## 2022-01-27 ENCOUNTER — Other Ambulatory Visit: Payer: Self-pay

## 2022-01-27 DIAGNOSIS — I480 Paroxysmal atrial fibrillation: Secondary | ICD-10-CM

## 2022-01-27 DIAGNOSIS — Z7952 Long term (current) use of systemic steroids: Secondary | ICD-10-CM | POA: Diagnosis not present

## 2022-01-27 DIAGNOSIS — I509 Heart failure, unspecified: Secondary | ICD-10-CM

## 2022-01-27 DIAGNOSIS — Z7901 Long term (current) use of anticoagulants: Secondary | ICD-10-CM | POA: Diagnosis not present

## 2022-01-27 DIAGNOSIS — Z79899 Other long term (current) drug therapy: Secondary | ICD-10-CM | POA: Diagnosis not present

## 2022-01-27 DIAGNOSIS — F458 Other somatoform disorders: Secondary | ICD-10-CM | POA: Diagnosis present

## 2022-01-27 DIAGNOSIS — J449 Chronic obstructive pulmonary disease, unspecified: Secondary | ICD-10-CM | POA: Diagnosis present

## 2022-01-27 DIAGNOSIS — Z7722 Contact with and (suspected) exposure to environmental tobacco smoke (acute) (chronic): Secondary | ICD-10-CM | POA: Diagnosis present

## 2022-01-27 DIAGNOSIS — I350 Nonrheumatic aortic (valve) stenosis: Secondary | ICD-10-CM | POA: Diagnosis present

## 2022-01-27 DIAGNOSIS — Z6833 Body mass index (BMI) 33.0-33.9, adult: Secondary | ICD-10-CM

## 2022-01-27 DIAGNOSIS — Z96651 Presence of right artificial knee joint: Secondary | ICD-10-CM | POA: Diagnosis present

## 2022-01-27 DIAGNOSIS — I110 Hypertensive heart disease with heart failure: Principal | ICD-10-CM | POA: Diagnosis present

## 2022-01-27 DIAGNOSIS — K219 Gastro-esophageal reflux disease without esophagitis: Secondary | ICD-10-CM | POA: Diagnosis present

## 2022-01-27 DIAGNOSIS — I5033 Acute on chronic diastolic (congestive) heart failure: Secondary | ICD-10-CM | POA: Diagnosis present

## 2022-01-27 DIAGNOSIS — Z923 Personal history of irradiation: Secondary | ICD-10-CM

## 2022-01-27 DIAGNOSIS — E669 Obesity, unspecified: Secondary | ICD-10-CM | POA: Diagnosis present

## 2022-01-27 DIAGNOSIS — R059 Cough, unspecified: Secondary | ICD-10-CM | POA: Diagnosis present

## 2022-01-27 DIAGNOSIS — Z833 Family history of diabetes mellitus: Secondary | ICD-10-CM

## 2022-01-27 DIAGNOSIS — J810 Acute pulmonary edema: Secondary | ICD-10-CM

## 2022-01-27 DIAGNOSIS — Z853 Personal history of malignant neoplasm of breast: Secondary | ICD-10-CM | POA: Diagnosis not present

## 2022-01-27 DIAGNOSIS — Z885 Allergy status to narcotic agent status: Secondary | ICD-10-CM

## 2022-01-27 DIAGNOSIS — I5031 Acute diastolic (congestive) heart failure: Secondary | ICD-10-CM

## 2022-01-27 DIAGNOSIS — D72819 Decreased white blood cell count, unspecified: Secondary | ICD-10-CM | POA: Diagnosis present

## 2022-01-27 DIAGNOSIS — Z20822 Contact with and (suspected) exposure to covid-19: Secondary | ICD-10-CM | POA: Diagnosis present

## 2022-01-27 DIAGNOSIS — J385 Laryngeal spasm: Secondary | ICD-10-CM

## 2022-01-27 DIAGNOSIS — R0600 Dyspnea, unspecified: Secondary | ICD-10-CM

## 2022-01-27 DIAGNOSIS — Z91013 Allergy to seafood: Secondary | ICD-10-CM

## 2022-01-27 LAB — CBC WITH DIFFERENTIAL/PLATELET
Abs Immature Granulocytes: 0.01 10*3/uL (ref 0.00–0.07)
Basophils Absolute: 0 10*3/uL (ref 0.0–0.1)
Basophils Relative: 1 %
Eosinophils Absolute: 0.1 10*3/uL (ref 0.0–0.5)
Eosinophils Relative: 3 %
HCT: 39.7 % (ref 36.0–46.0)
Hemoglobin: 12.3 g/dL (ref 12.0–15.0)
Immature Granulocytes: 0 %
Lymphocytes Relative: 25 %
Lymphs Abs: 1 10*3/uL (ref 0.7–4.0)
MCH: 25.8 pg — ABNORMAL LOW (ref 26.0–34.0)
MCHC: 31 g/dL (ref 30.0–36.0)
MCV: 83.4 fL (ref 80.0–100.0)
Monocytes Absolute: 0.5 10*3/uL (ref 0.1–1.0)
Monocytes Relative: 13 %
Neutro Abs: 2.3 10*3/uL (ref 1.7–7.7)
Neutrophils Relative %: 58 %
Platelets: 218 10*3/uL (ref 150–400)
RBC: 4.76 MIL/uL (ref 3.87–5.11)
RDW: 15.6 % — ABNORMAL HIGH (ref 11.5–15.5)
WBC: 3.9 10*3/uL — ABNORMAL LOW (ref 4.0–10.5)
nRBC: 0 % (ref 0.0–0.2)

## 2022-01-27 LAB — TROPONIN I (HIGH SENSITIVITY): Troponin I (High Sensitivity): 9 ng/L (ref <18)

## 2022-01-27 LAB — BASIC METABOLIC PANEL
Anion gap: 10 (ref 5–15)
BUN: 18 mg/dL (ref 8–23)
CO2: 28 mmol/L (ref 22–32)
Calcium: 9.4 mg/dL (ref 8.9–10.3)
Chloride: 101 mmol/L (ref 98–111)
Creatinine, Ser: 0.77 mg/dL (ref 0.44–1.00)
GFR, Estimated: >60 mL/min (ref >60)
Glucose, Bld: 111 mg/dL — ABNORMAL HIGH (ref 70–99)
Potassium: 4 mmol/L (ref 3.5–5.1)
Sodium: 139 mmol/L (ref 135–145)

## 2022-01-27 LAB — BRAIN NATRIURETIC PEPTIDE: B Natriuretic Peptide: 129.1 pg/mL — ABNORMAL HIGH (ref 0.0–100.0)

## 2022-01-27 MED ORDER — VITAMIN B-12 1000 MCG PO TABS
1000.0000 ug | ORAL_TABLET | Freq: Every day | ORAL | Status: DC
  Administered 2022-01-28: 1000 ug via ORAL
  Filled 2022-01-27 (×2): qty 1

## 2022-01-27 MED ORDER — OYSTER SHELL CALCIUM/D3 500-5 MG-MCG PO TABS
1.0000 | ORAL_TABLET | Freq: Two times a day (BID) | ORAL | Status: DC
  Administered 2022-01-28: 1 via ORAL
  Filled 2022-01-27 (×2): qty 1

## 2022-01-27 MED ORDER — FUROSEMIDE 10 MG/ML IJ SOLN
40.0000 mg | Freq: Two times a day (BID) | INTRAMUSCULAR | Status: DC
  Administered 2022-01-28 – 2022-01-29 (×3): 40 mg via INTRAVENOUS
  Filled 2022-01-27 (×2): qty 4

## 2022-01-27 MED ORDER — PANTOPRAZOLE SODIUM 40 MG PO TBEC
40.0000 mg | DELAYED_RELEASE_TABLET | Freq: Every day | ORAL | Status: DC
  Administered 2022-01-28: 40 mg via ORAL
  Filled 2022-01-27: qty 1

## 2022-01-27 MED ORDER — METOPROLOL TARTRATE 50 MG PO TABS
50.0000 mg | ORAL_TABLET | Freq: Two times a day (BID) | ORAL | Status: DC
  Administered 2022-01-27 – 2022-01-29 (×4): 50 mg via ORAL
  Filled 2022-01-27: qty 1
  Filled 2022-01-27 (×2): qty 2
  Filled 2022-01-27: qty 1

## 2022-01-27 MED ORDER — FAMOTIDINE 20 MG PO TABS: 20.0000 mg | ORAL_TABLET | Freq: Two times a day (BID) | ORAL | Status: DC

## 2022-01-27 MED ORDER — POLYETHYLENE GLYCOL 3350 17 G PO PACK: 17.0000 g | PACK | Freq: Every day | ORAL | Status: DC | PRN

## 2022-01-27 MED ORDER — FUROSEMIDE 10 MG/ML IJ SOLN
40.0000 mg | Freq: Once | INTRAMUSCULAR | Status: AC
  Administered 2022-01-27: 40 mg via INTRAVENOUS
  Filled 2022-01-27: qty 4

## 2022-01-27 MED ORDER — IPRATROPIUM-ALBUTEROL 0.5-2.5 (3) MG/3ML IN SOLN
3.0000 mL | Freq: Once | RESPIRATORY_TRACT | Status: DC
  Filled 2022-01-27: qty 3

## 2022-01-27 MED ORDER — ONDANSETRON HCL 4 MG/2ML IJ SOLN: 4.0000 mg | Freq: Four times a day (QID) | INTRAMUSCULAR | Status: DC | PRN

## 2022-01-27 MED ORDER — APIXABAN 2.5 MG PO TABS
2.5000 mg | ORAL_TABLET | Freq: Two times a day (BID) | ORAL | Status: DC
Start: 2022-01-27 — End: 2022-01-28
  Administered 2022-01-28 (×2): 2.5 mg via ORAL
  Filled 2022-01-27 (×2): qty 1

## 2022-01-27 MED ORDER — ACETAMINOPHEN 325 MG PO TABS
650.0000 mg | ORAL_TABLET | Freq: Four times a day (QID) | ORAL | Status: DC | PRN
  Filled 2022-01-27: qty 2

## 2022-01-27 MED ORDER — POTASSIUM CHLORIDE CRYS ER 20 MEQ PO TBCR
20.0000 meq | EXTENDED_RELEASE_TABLET | Freq: Every day | ORAL | Status: DC
  Administered 2022-01-28 – 2022-01-29 (×2): 20 meq via ORAL
  Filled 2022-01-27 (×2): qty 1

## 2022-01-27 MED ORDER — LORAZEPAM 2 MG/ML IJ SOLN
1.0000 mg | Freq: Once | INTRAMUSCULAR | Status: AC
  Administered 2022-01-27: 0.5 mg via INTRAVENOUS
  Filled 2022-01-27: qty 1

## 2022-01-27 MED ORDER — VITAMIN D 25 MCG (1000 UNIT) PO TABS
1000.0000 [IU] | ORAL_TABLET | Freq: Every day | ORAL | Status: DC
  Administered 2022-01-28: 1000 [IU] via ORAL
  Filled 2022-01-27 (×2): qty 1

## 2022-01-27 MED ORDER — ONDANSETRON HCL 4 MG PO TABS: 4.0000 mg | ORAL_TABLET | Freq: Four times a day (QID) | ORAL | Status: DC | PRN

## 2022-01-27 MED ORDER — OCUVITE-LUTEIN PO CAPS
ORAL_CAPSULE | Freq: Two times a day (BID) | ORAL | Status: DC
  Administered 2022-01-28: 1 via ORAL
  Filled 2022-01-27 (×4): qty 1

## 2022-01-27 MED ORDER — ACETAMINOPHEN 650 MG RE SUPP: 650.0000 mg | Freq: Four times a day (QID) | RECTAL | Status: DC | PRN

## 2022-01-27 MED ORDER — LORAZEPAM 2 MG/ML IJ SOLN
0.5000 mg | INTRAMUSCULAR | Status: DC | PRN
  Administered 2022-01-28: 0.5 mg via INTRAVENOUS
  Filled 2022-01-27: qty 1

## 2022-01-27 MED ORDER — DEXAMETHASONE SODIUM PHOSPHATE 10 MG/ML IJ SOLN
10.0000 mg | Freq: Once | INTRAMUSCULAR | Status: AC
  Administered 2022-01-27: 10 mg via INTRAVENOUS
  Filled 2022-01-27: qty 1

## 2022-01-27 NOTE — Assessment & Plan Note (Addendum)
-   The patient will be continued on p.o. Cardizem CD and Lopressor. - She has a CHA2DS2-VASc score of 4. - We will continue Eliquis.

## 2022-01-27 NOTE — ED Provider Notes (Signed)
[facility] Provider Note    Event Date/Time   First MD Initiated Contact with Patient 01/27/22 1845     (approximate)   History   Cough   HPI  Cathy Spears is a 86 y.o. female here with recurrent cough and episode of difficulty/inability to breathe.  Patient was just seen by myself yesterday.  She has been having increasing cough, leg swelling, ongoing for several weeks to months.  She was given Lasix.  She states she has not necessarily been peeing more.  She had felt well throughout the day today, but this afternoon, had another episode of significant coughing.  She then felt like she could not breathe and reportedly was trying to breathe but no air was moving.  She took a drink of water, and her symptoms improved.  She has had mild persistent cough since then.  Otherwise, no chest pain.  No other complaints.  No sputum production.  She has been taking Lasix as prescribed.     Physical Exam   Triage Vital Signs: ED Triage Vitals  Enc Vitals Group     BP 01/27/22 1800 (!) 144/76     Pulse Rate 01/27/22 1800 91     Resp 01/27/22 1800 18     Temp 01/27/22 1800 98.2 F (36.8 C)     Temp Source 01/27/22 1800 Oral     SpO2 01/27/22 1800 95 %     Weight 01/27/22 1807 184 lb 15.5 oz (83.9 kg)     Height 01/27/22 1807 '5\' 2"'$  (1.575 m)     Head Circumference --      Peak Flow --      Pain Score 01/27/22 1807 0     Pain Loc --      Pain Edu? --      Excl. in [place]? --     Most recent vital signs: Vitals:   01/27/22 1900 01/27/22 2111  BP: (!) 141/76   Pulse: 86 (!) 105  Resp: 19   Temp:    SpO2: 94%      General: Awake, no distress.  CV:  Good peripheral perfusion.  Resp:  Normal effort.  Bibasilar Rales, particularly in the right lower lobe.  Occasional cough. Abd:  No distention.  Other:  Trace pitting edema bilateral lower extremities.   ED Results / Procedures / Treatments   Labs (all labs ordered are listed, but only abnormal  results are displayed) Labs Reviewed  CBC WITH DIFFERENTIAL/PLATELET - Abnormal; Notable for the following components:      Result Value   WBC 3.9 (*)    MCH 25.8 (*)    RDW 15.6 (*)    All other components within normal limits  BASIC METABOLIC PANEL - Abnormal; Notable for the following components:   Glucose, Bld 111 (*)    All other components within normal limits  BRAIN NATRIURETIC PEPTIDE - Abnormal; Notable for the following components:   B Natriuretic Peptide 129.1 (*)    All other components within normal limits  SARS CORONAVIRUS 2 BY RT PCR  BASIC METABOLIC PANEL  CBC  TROPONIN I (HIGH SENSITIVITY)  TROPONIN I (HIGH SENSITIVITY)     EKG Atrial fibrillation, VR 104. QRS 102, Qtc 467. No acute ST elevations or depressions. No ischemia or infarct.   RADIOLOGY Chest x-ray: Stable cardiomegaly with mild pulmonary vascular congestion   I also independently reviewed and agree with radiologist interpretations.   PROCEDURES:  Critical Care performed: No  MEDICATIONS  ORDERED IN ED: Medications  metoprolol tartrate (LOPRESSOR) tablet 50 mg (50 mg Oral Given 01/27/22 2111)  pantoprazole (PROTONIX) EC tablet 40 mg (has no administration in time range)  apixaban (ELIQUIS) tablet 2.5 mg (has no administration in time range)  vitamin B-12 (CYANOCOBALAMIN) tablet 1,000 mcg (has no administration in time range)  calcium-vitamin D (OSCAL WITH D) 500-5 MG-MCG per tablet 1 tablet (has no administration in time range)  cholecalciferol (VITAMIN D3) tablet 1,000 Units (has no administration in time range)  multivitamin-lutein (OCUVITE-LUTEIN) capsule (has no administration in time range)  potassium chloride SA (KLOR-CON M) CR tablet 20 mEq (has no administration in time range)  furosemide (LASIX) injection 40 mg (has no administration in time range)  acetaminophen (TYLENOL) tablet 650 mg (has no administration in time range)    Or  acetaminophen (TYLENOL) suppository 650 mg (has no  administration in time range)  polyethylene glycol (MIRALAX / GLYCOLAX) packet 17 g (has no administration in time range)  ondansetron (ZOFRAN) tablet 4 mg (has no administration in time range)    Or  ondansetron (ZOFRAN) injection 4 mg (has no administration in time range)  ipratropium-albuterol (DUONEB) 0.5-2.5 (3) MG/3ML nebulizer solution 3 mL (3 mLs Nebulization Not Given 01/27/22 2111)  LORazepam (ATIVAN) injection 0.5 mg (has no administration in time range)  furosemide (LASIX) injection 40 mg (40 mg Intravenous Given 01/27/22 2111)  LORazepam (ATIVAN) injection 1 mg (0.5 mg Intravenous Given 01/27/22 2017)  dexamethasone (DECADRON) injection 10 mg (10 mg Intravenous Given 01/27/22 2017)     IMPRESSION / MDM / ASSESSMENT AND PLAN / ED COURSE  I reviewed the triage vital signs and the nursing notes.                               The patient is on the cardiac monitor to evaluate for evidence of arrhythmia and/or significant heart rate changes.   Ddx:  Differential includes the following, with pertinent life- or limb-threatening emergencies considered:  CHF exacerbation, recurrent aspiration, laryngospasm, occult PNA  Patient's presentation is most consistent with acute presentation with potential threat to life or bodily function.  MDM:  86 yo F here with recurrent SOB, episodes of difficulty breathing. Pt seen by myself yesterday. Clinically, she appears well, satting well on RA in no distress. However, history sounds concerning for possible laryngospasm. Pt has intermittent episodes between episodes of coughing.  Re: cough - suspect she may have mild CHF. H/o AFib so could be related. BNP elevated, trace edema noted on exam. CXR shows vascular congestion. CBC with no leukocytosis. BMP unremarkable. Will give additional dose of IV lasix, plan to admit for obs/TTE.  While in ED, pt had episode of witnessed dyspnea. Pt noted to grab her throat, saying she could not breath, with  intermittent stridor/wheezing noted from the upper airway, though overall air movement is intact and sats remained >95%. Voice hoarse during episode. Suspect laryngospasm - maybe 2/2 her coughing from CHF/COPD, or chronic aspiration/GERD related to her esophageal issues. D/w Dr. Sheppard Coil, recommends speech consult. Will give decadron for possible cord edema, admit to medicine.   MEDICATIONS GIVEN IN ED: Medications  metoprolol tartrate (LOPRESSOR) tablet 50 mg (50 mg Oral Given 01/27/22 2111)  pantoprazole (PROTONIX) EC tablet 40 mg (has no administration in time range)  apixaban (ELIQUIS) tablet 2.5 mg (has no administration in time range)  vitamin B-12 (CYANOCOBALAMIN) tablet 1,000 mcg (has no administration in time range)  calcium-vitamin D (OSCAL WITH D) 500-5 MG-MCG per tablet 1 tablet (has no administration in time range)  cholecalciferol (VITAMIN D3) tablet 1,000 Units (has no administration in time range)  multivitamin-lutein (OCUVITE-LUTEIN) capsule (has no administration in time range)  potassium chloride SA (KLOR-CON M) CR tablet 20 mEq (has no administration in time range)  furosemide (LASIX) injection 40 mg (has no administration in time range)  acetaminophen (TYLENOL) tablet 650 mg (has no administration in time range)    Or  acetaminophen (TYLENOL) suppository 650 mg (has no administration in time range)  polyethylene glycol (MIRALAX / GLYCOLAX) packet 17 g (has no administration in time range)  ondansetron (ZOFRAN) tablet 4 mg (has no administration in time range)    Or  ondansetron (ZOFRAN) injection 4 mg (has no administration in time range)  ipratropium-albuterol (DUONEB) 0.5-2.5 (3) MG/3ML nebulizer solution 3 mL (3 mLs Nebulization Not Given 01/27/22 2111)  LORazepam (ATIVAN) injection 0.5 mg (has no administration in time range)  furosemide (LASIX) injection 40 mg (40 mg Intravenous Given 01/27/22 2111)  LORazepam (ATIVAN) injection 1 mg (0.5 mg Intravenous Given 01/27/22 2017)   dexamethasone (DECADRON) injection 10 mg (10 mg Intravenous Given 01/27/22 2017)     Consults:  Dr. Sheppard Coil ENT Dr. [facility]   EMR reviewed  Prior ED visits Prior TTE      FINAL CLINICAL IMPRESSION(S) / ED DIAGNOSES   Final diagnoses:  Spasm of vocal cords  Dyspnea, unspecified type  Acute pulmonary edema ([place])     Rx / DC Orders   ED Discharge Orders     None        Note:  This document was prepared using Dragon voice recognition software and may include unintentional dictation errors.   Duffy Bruce, MD 01/27/22 2230

## 2022-01-27 NOTE — Assessment & Plan Note (Signed)
-   We will continue PPI therapy 

## 2022-01-27 NOTE — H&P (Addendum)
[place]   PATIENT NAME: Cathy Spears    MR#:  [id number]  DATE OF BIRTH:  [birth year]  DATE OF ADMISSION:  01/27/2022  PRIMARY CARE PHYSICIAN: Rusty Aus, MD   Patient is coming from: Home  REQUESTING/REFERRING PHYSICIAN: Duffy Bruce, MD  CHIEF COMPLAINT:   Chief Complaint  Patient presents with   Cough    HISTORY OF PRESENT ILLNESS:  Cathy Spears is a 86 y.o. female with medical history significant for GERD, atrial fibrillation, on Eliquis hypertension and breast cancer, presented to emergency room with a [place] of worsening cough with occasional inability to breathe as well as wheezing.  She admitted to orthopnea and worsening lower extremity edema for the last couple of days.  She admits to dyspnea on exertion.  No chest pain or palpitations.  No nausea or vomiting or abdominal pain.  No dysuria, oliguria or hematuria or flank pain.  While she was in the ER she was witnessed to have "spasms with significant anxiety and difficulty breathing however with normal pulse oximetry.  She has had similar episodes lately that would sometimes improve when she drinks water.  ED Course: Upon presentation to the emergency room, vital signs were within normal.  Labs revealed unremarkable BMP.  BNP was 129 yesterday 168 high-sensitivity troponin I was 9 and CBC showed mild leukopenia 3.9. EKG as reviewed by me : EKG showed atrial fibrillation with slightly rapid ventricular response of 104 and low voltage QRS Imaging: Two-view chest x-ray showed stable cardiomegaly with mild pulmonary vascular congestion.  The patient was given 40 mg of IV Lasix and 10 mg of IV Decadron.  She will be admitted to a progressive unit bed for further evaluation and management. PAST MEDICAL HISTORY:   Past Medical History:  Diagnosis Date   Anxiety    Breast cancer ([place])    Cancer ([place])    ,bilateral lymph nodes removed  breast mostly from the left   Cataracts, bilateral     Complication of anesthesia    Dysrhythmia    hx of AF   Esophageal erosions    GERD (gastroesophageal reflux disease)    Hypertension    dr Saralyn Pilar  at [facility]   Personal history of radiation therapy    PONV (postoperative nausea and vomiting)    spinal anesthesia x1  Paroxysmal atrial fibrillation on Eliquis  PAST SURGICAL HISTORY:   Past Surgical History:  Procedure Laterality Date   BALLOON DILATION N/A 09/24/2017   Procedure: BALLOON DILATION;  Surgeon: Toledo, Benay Pike, MD;  Location: [facility] ENDOSCOPY;  Service: Gastroenterology;  Laterality: N/A;   BREAST BIOPSY Right    BREAST LUMPECTOMY Bilateral    2010   BREAST SURGERY     lumpectomy bil,pt. states lymph nodes were removed and  the left arm is restricted   COLONOSCOPY WITH ESOPHAGOGASTRODUODENOSCOPY (EGD)     ESOPHAGOGASTRODUODENOSCOPY (EGD) WITH PROPOFOL N/A 09/24/2017   Procedure: ESOPHAGOGASTRODUODENOSCOPY (EGD) WITH PROPOFOL;  Surgeon: Toledo, Benay Pike, MD;  Location: [facility] ENDOSCOPY;  Service: Gastroenterology;  Laterality: N/A;   HERNIA REPAIR     LUMBAR LAMINECTOMY/DECOMPRESSION MICRODISCECTOMY  08/22/2011   Procedure: LUMBAR LAMINECTOMY/DECOMPRESSION MICRODISCECTOMY;  Surgeon: Ophelia Charter, MD;  Location: [place] [facility];  Service: Neurosurgery;  Laterality: Right;  RIGHT Lumbar five sacral one  laminectomy and microdiscectomy   TOTAL KNEE ARTHROPLASTY Right 01/14/2019   Procedure: TOTAL KNEE ARTHROPLASTY RIGHT;  Surgeon: Corky Mull, MD;  Location: [facility] ORS;  Service: Orthopedics;  Laterality:  Right;    SOCIAL HISTORY:   Social History   Tobacco Use   Smoking status: Never   Smokeless tobacco: Never  Substance Use Topics   Alcohol use: No    FAMILY HISTORY:   Positive for diabetes mellitus  DRUG ALLERGIES:   Allergies  Allergen Reactions   Fish-Derived Products Swelling    Blue Fish years ago.  Caused redness over all of face   Oxycodone Swelling    REVIEW OF SYSTEMS:   ROS As  per history of present illness. All pertinent systems were reviewed above. Constitutional, HEENT, cardiovascular, respiratory, GI, GU, musculoskeletal, neuro, psychiatric, endocrine, integumentary and hematologic systems were reviewed and are otherwise negative/unremarkable except for positive findings mentioned above in the HPI.   MEDICATIONS AT HOME:   Prior to Admission medications   Medication Sig Start Date End Date Taking? Authorizing Provider  apixaban (ELIQUIS) 2.5 MG TABS tablet Take 1 tablet (2.5 mg total) by mouth 2 (two) times daily. 01/17/19   Reche Dixon, PA-C  Calcium Carbonate-Vitamin D (TH CALCIUM CARBONATE-VITAMIN D) 600-400 MG-UNIT tablet Take 1 tablet by mouth 2 (two) times daily.    [provider]  cholecalciferol (VITAMIN D) 1000 UNITS tablet Take 1,000 Units by mouth daily.    [provider]  diltiazem (CARDIZEM CD) 120 MG 24 hr capsule Take 1 capsule (120 mg total) by mouth daily. 01/17/19   Reche Dixon, PA-C  famotidine (PEPCID) 20 MG tablet Take 1 tablet (20 mg total) by mouth 2 (two) times daily for 7 days. 02/01/19 02/08/19  Menshew, Dannielle Karvonen, PA-C  furosemide (LASIX) 20 MG tablet Take 2 tablets (40 mg total) by mouth daily for 3 days, THEN 1 tablet (20 mg total) daily as needed for up to 27 days for edema or fluid. 01/26/22 02/25/22  Duffy Bruce, MD  metoprolol (LOPRESSOR) 50 MG tablet Take 50 mg by mouth 2 (two) times daily.    [provider]  Multiple Vitamins-Minerals (PRESERVISION AREDS PO) Take 1 tablet by mouth 2 (two) times daily.    [provider]  omeprazole (PRILOSEC) 40 MG capsule Take 40 mg by mouth 2 (two) times a day.     [provider]  ondansetron (ZOFRAN) 4 MG tablet Take 1 tablet (4 mg total) by mouth every 6 (six) hours as needed for nausea. 01/17/19   Reche Dixon, PA-C  oxyCODONE (OXY IR/ROXICODONE) 5 MG immediate release tablet Take 1-2 tablets (5-10 mg total) by mouth every 4 (four) hours as needed  for moderate pain (pain score 4-6). 01/17/19   Reche Dixon, PA-C  potassium chloride SA (KLOR-CON M) 20 MEQ tablet Take one tablet (20 mEq) with your FUROSEMIDE dose on days when you are taking it. 01/26/22   Duffy Bruce, MD  predniSONE (DELTASONE) 10 MG tablet Take 1 tablet (10 mg total) by mouth 2 (two) times daily with a meal. 02/01/19   Menshew, Dannielle Karvonen, PA-C  traMADol (ULTRAM) 50 MG tablet Take 1 tablet (50 mg total) by mouth every 6 (six) hours as needed for moderate pain. 01/17/19   Reche Dixon, PA-C  vitamin B-12 (CYANOCOBALAMIN) 1000 MCG tablet Take 1,000 mcg by mouth daily.    [provider]      VITAL SIGNS:  Blood pressure (!) 141/76, pulse (!) 105, temperature 98.2 F (36.8 C), temperature source Oral, resp. rate 19, height '5\' 2"'$  (1.575 m), weight 83.9 kg, SpO2 94 %.  PHYSICAL EXAMINATION:  Physical Exam  GENERAL:  86 y.o.-year-old patient  lying in the bed with no acute distress.  EYES: Pupils equal, round, reactive to light and accommodation. No scleral icterus. Extraocular muscles intact.  HEENT: Head atraumatic, normocephalic. Oropharynx and nasopharynx clear.  NECK:  Supple, no jugular venous distention. No thyroid enlargement, no tenderness.  LUNGS: Diminished bibasilar breath sounds with bibasal rales.  No use of accessory muscles of respiration.  CARDIOVASCULAR: Regular rate and rhythm, S1, S2 normal. No murmurs, rubs, or gallops.  ABDOMEN: Soft, nondistended, nontender. Bowel sounds present. No organomegaly or mass.  EXTREMITIES: Left-sided pitting edema with with no cyanosis, or clubbing.  NEUROLOGIC: Cranial nerves II through XII are intact. Muscle strength 5/5 in all extremities. Sensation intact. Gait not checked.  PSYCHIATRIC: The patient is alert and oriented x 3.  Normal affect and good eye contact. SKIN: No obvious rash, lesion, or ulcer.   LABORATORY PANEL:   CBC Recent Labs  Lab 01/27/22 1834  WBC 3.9*  HGB 12.3  HCT 39.7  PLT 218    ------------------------------------------------------------------------------------------------------------------  Chemistries  Recent Labs  Lab 01/26/22 1451 01/27/22 1834  NA 137 139  K 3.8 4.0  CL 104 101  CO2 24 28  GLUCOSE 106* 111*  BUN 18 18  CREATININE 0.77 0.77  CALCIUM 9.1 9.4  AST 23  --   ALT 16  --   ALKPHOS 67  --   BILITOT 0.6  --    ------------------------------------------------------------------------------------------------------------------  Cardiac Enzymes No results for input(s): "TROPONINI" in the last 168 hours. ------------------------------------------------------------------------------------------------------------------  RADIOLOGY:  DG Chest 2 View  Result Date: 01/27/2022 CLINICAL DATA:  Shortness of breath. EXAM: CHEST - 2 VIEW COMPARISON:  January 26, 2022 FINDINGS: There is stable mild to moderate severity enlargement of the cardiac silhouette. There is marked severity calcification of the aortic arch. Stable, diffusely increased interstitial lung markings are noted. Mild prominence of the pulmonary vasculature is seen. There is no evidence of focal consolidation, pleural effusion or pneumothorax. Multilevel degenerative changes are seen throughout the thoracic spine. IMPRESSION: 1. Stable cardiomegaly with mild pulmonary vascular congestion. Electronically Signed   By: Virgina Norfolk M.D.   On: 01/27/2022 19:23   DG Chest 2 View  Result Date: 01/26/2022 CLINICAL DATA:  Per Triage: Pt via POV from home. Pt c/o dry cough that started last weekend. States that last couple of days she has been coughing non-stop.Denies any chest pain. Denies any SOB. Denies fever. Pt is AANDOX4 and NAD EXAM: CHEST - 2 VIEW COMPARISON:  01/16/2019.  CT, 01/14/2019. FINDINGS: Stable enlargement of the cardiac silhouette. No mediastinal or hilar masses. No evidence of adenopathy. Prominent interstitial markings most evident in the bases, also stable. No evidence of  pneumonia or pulmonary edema. No pleural effusion or pneumothorax. Skeletal structures are grossly intact. IMPRESSION: 1. No acute cardiopulmonary disease. Electronically Signed   By: Lajean Manes M.D.   On: 01/26/2022 14:14      IMPRESSION AND PLAN:  Assessment and Plan: * Acute CHF (congestive heart failure) (HCC) - This is onset and likely diastolic. - The patient was admitted to the progressive unit bed. - We will continue diuresis with IV Lasix. - We will follow serial troponins. - 2D echo and cardiology consult will be obtained. - I notified Dr. Saralyn Pilar about the patient.  Paroxysmal atrial fibrillation with rapid ventricular response ([place]) - The patient will be continued on p.o. Cardizem CD and Lopressor. - She has a CHA2DS2-VASc score of 4. - She will benefit from anticoagulation. - We will start her on  p.o. Eliquis  Laryngeal spasm - She was given a dose of IV Decadron. - Dr. Sheppard Coil with ENT was notified about the patient and recommended management of her cough etiology.  Will obtain ENT consult in AM.  GERD without esophagitis - We will continue PPI therapy.     DVT prophylaxis: Eliquis.   Advanced Care Planning:  Code Status: full code.  Family Communication:  The plan of care was discussed in details with the patient (and family). I answered all questions. The patient agreed to proceed with the above mentioned plan. Further management will depend upon hospital course. Disposition Plan: Back to previous home environment Consults called: Cardiology and ENT. All the records are reviewed and case discussed with ED provider.  Status is: Inpatient  At the time of the admission, it appears that the appropriate admission status for this patient is inpatient.  This is judged to be reasonable and necessary in order to provide the required intensity of service to ensure the patient's safety given the presenting symptoms, physical exam findings and initial radiographic and  laboratory data in the context of comorbid conditions.  The patient requires inpatient status due to high intensity of service, high risk of further deterioration and high frequency of surveillance required.  I certify that at the time of admission, it is my clinical judgment that the patient will require inpatient hospital care extending more than 2 midnights.                            Dispo: The patient is from: Home              Anticipated d/c is to: Home              Patient currently is not medically stable to d/c.              Difficult to place patient: No  Christel Mormon M.D on 01/28/2022 at 12:01 AM  [facility]   From 7 PM-7 AM, contact night-coverage [email or web address]  CC: Primary care physician; Rusty Aus, MD

## 2022-01-27 NOTE — Assessment & Plan Note (Signed)
-   This is onset and likely diastolic. - The patient was admitted to the progressive unit bed. - We will continue diuresis with IV Lasix. - We will follow serial troponins. - 2D echo and cardiology consult will be obtained. - I notified Dr. Saralyn Pilar about the patient.

## 2022-01-27 NOTE — ED Notes (Signed)
Pt to ED for cough and SOB that started Thursday. Denies CP. Pt has hx COPD.  Denies fevers.

## 2022-01-27 NOTE — ED Triage Notes (Signed)
Pt to ED seen here for same yesterday, still has dry cough. Pt and daughter state that when she starts coughing, she cannot breathe and feels like she is choking.   Pt was given diuretics at ED yesterday for CHF. Daughter states pt has not been urinating as often as they expected.   Pt is not coughing at this time. SPO2 is 94% on room air. Breathing is unlabored.  EDP note from yesterday states that pt was seen here for acute cough, worse with supine position.  Contacted to provider for guidance on triage orders.  Ankles are swollen.

## 2022-01-27 NOTE — Assessment & Plan Note (Signed)
-   She was given a dose of IV Decadron. - Dr. Sheppard Coil with ENT was notified about the patient and recommended management of her cough etiology.  Will obtain ENT consult in AM.

## 2022-01-28 ENCOUNTER — Inpatient Hospital Stay
Admit: 2022-01-28 | Discharge: 2022-01-28 | Disposition: A | Discharge status: Home / Self Care | Payer: Medicare HMO | Attending: Family Medicine | Admitting: Family Medicine

## 2022-01-28 DIAGNOSIS — J385 Laryngeal spasm: Secondary | ICD-10-CM | POA: Diagnosis not present

## 2022-01-28 DIAGNOSIS — I509 Heart failure, unspecified: Secondary | ICD-10-CM | POA: Diagnosis not present

## 2022-01-28 DIAGNOSIS — I480 Paroxysmal atrial fibrillation: Secondary | ICD-10-CM | POA: Diagnosis not present

## 2022-01-28 LAB — ECHOCARDIOGRAM COMPLETE
AR max vel: 2.02 cm2
AV Area VTI: 2.19 cm2
AV Area mean vel: 1.99 cm2
AV Mean grad: 3.3 mmHg
AV Peak grad: 6.2 mmHg
Ao pk vel: 1.25 m/s
Area-P 1/2: 5.16 cm2
Height: 62 in
MV VTI: 2.34 cm2
S' Lateral: 3.2 cm
Weight: 2959.46 oz

## 2022-01-28 LAB — CBC
HCT: 40.8 % (ref 36.0–46.0)
Hemoglobin: 12.9 g/dL (ref 12.0–15.0)
MCH: 26.3 pg (ref 26.0–34.0)
MCHC: 31.6 g/dL (ref 30.0–36.0)
MCV: 83.1 fL (ref 80.0–100.0)
Platelets: 222 10*3/uL (ref 150–400)
RBC: 4.91 MIL/uL (ref 3.87–5.11)
RDW: 15.1 % (ref 11.5–15.5)
WBC: 3.4 10*3/uL — ABNORMAL LOW (ref 4.0–10.5)
nRBC: 0 % (ref 0.0–0.2)

## 2022-01-28 LAB — BASIC METABOLIC PANEL
Anion gap: 8 (ref 5–15)
BUN: 18 mg/dL (ref 8–23)
CO2: 29 mmol/L (ref 22–32)
Calcium: 9.4 mg/dL (ref 8.9–10.3)
Chloride: 99 mmol/L (ref 98–111)
Creatinine, Ser: 0.69 mg/dL (ref 0.44–1.00)
GFR, Estimated: >60 mL/min (ref >60)
Glucose, Bld: 164 mg/dL — ABNORMAL HIGH (ref 70–99)
Potassium: 4.2 mmol/L (ref 3.5–5.1)
Sodium: 136 mmol/L (ref 135–145)

## 2022-01-28 LAB — TROPONIN I (HIGH SENSITIVITY): Troponin I (High Sensitivity): 7 ng/L (ref <18)

## 2022-01-28 LAB — SARS CORONAVIRUS 2 BY RT PCR: SARS Coronavirus 2 by RT PCR: NEGATIVE

## 2022-01-28 MED ORDER — BENZONATATE 100 MG PO CAPS
200.0000 mg | ORAL_CAPSULE | Freq: Three times a day (TID) | ORAL | Status: DC | PRN
Start: 2022-01-28 — End: 2022-01-29
  Administered 2022-01-28: 200 mg via ORAL
  Filled 2022-01-28: qty 2

## 2022-01-28 MED ORDER — APIXABAN 5 MG PO TABS
5.0000 mg | ORAL_TABLET | Freq: Two times a day (BID) | ORAL | Status: DC
  Administered 2022-01-28 – 2022-01-29 (×2): 5 mg via ORAL
  Filled 2022-01-28 (×2): qty 1

## 2022-01-28 MED ORDER — PANTOPRAZOLE SODIUM 40 MG PO TBEC
40.0000 mg | DELAYED_RELEASE_TABLET | Freq: Two times a day (BID) | ORAL | Status: DC
  Administered 2022-01-28: 40 mg via ORAL
  Filled 2022-01-28 (×2): qty 1

## 2022-01-28 MED ORDER — ALPRAZOLAM 0.25 MG PO TABS: 0.2500 mg | ORAL_TABLET | Freq: Two times a day (BID) | ORAL | Status: DC | PRN

## 2022-01-28 MED ORDER — METOPROLOL TARTRATE 5 MG/5ML IV SOLN: 5.0000 mg | Freq: Three times a day (TID) | INTRAVENOUS | Status: DC | PRN

## 2022-01-28 MED ORDER — GUAIFENESIN ER 600 MG PO TB12
1200.0000 mg | ORAL_TABLET | Freq: Two times a day (BID) | ORAL | Status: DC
Start: 2022-01-28 — End: 2022-01-29
  Administered 2022-01-28 – 2022-01-29 (×2): 1200 mg via ORAL
  Filled 2022-01-28 (×2): qty 2

## 2022-01-28 MED ORDER — DILTIAZEM HCL ER COATED BEADS 120 MG PO CP24
120.0000 mg | ORAL_CAPSULE | Freq: Every day | ORAL | Status: DC
  Administered 2022-01-28 – 2022-01-29 (×2): 120 mg via ORAL
  Filled 2022-01-28 (×2): qty 1

## 2022-01-28 NOTE — Progress Notes (Signed)
PROGRESS NOTE    Cathy Spears  QIW:[id number] DOB: 1935/08/07 DOA: 01/27/2022 PCP: Rusty Aus, MD   Assessment & Plan:   Principal Problem:   Acute CHF (congestive heart failure) ([place]) Active Problems:   Paroxysmal atrial fibrillation with rapid ventricular response (HCC)   Laryngeal spasm   GERD without esophagitis  Assessment and Plan: Acute CHF: unclear systolic vs diastolic vs combined. Echo ordered. Continue on IV lasix. Monitor I/Os. Cardio recs apprec    PAF: w/ RVR. Continue on cardizem, metoprolol, & started on eliquis. CHA2DS2-VASc score of 4. Cardio recs apprec    Possible laryngeal spasm: s/p IV steroids. Possibly secondary to GERD vs esophageal stenosis (hx of this & s/p esophageal dilatation a couple of years ago). S/p laryngoscope which was normal as per ENT. Speech consulted   GERD: continue on PPI      DVT prophylaxis: eliquis  Code Status: full  Family Communication: discussed pt's care w/ pt's family at bedside and answered their questions  Disposition Plan: likely d/c back home   Level of care: Progressive  Status is: Inpatient Remains inpatient appropriate because: severity of illness    Consultants:  ENT Cardio   Procedures:   Antimicrobials:    Subjective: Pt c/o throat spasm.   Objective: Vitals:   01/28/22 0000 01/28/22 0300 01/28/22 0600 01/28/22 0725  BP: 110/66 110/61 122/69 114/68  Pulse: 79 88 85 (!) 106  Resp: 20 (!) 26 17   Temp:      TempSrc:      SpO2: 95% 94% 97%   Weight:      Height:        Intake/Output Summary (Last 24 hours) at 01/28/2022 0814 Last data filed at 01/28/2022 0103 Gross per 24 hour  Intake --  Output 1850 ml  Net -1850 ml   Filed Weights   01/27/22 1807  Weight: 83.9 kg    Examination:  General exam: Appears calm and comfortable  Respiratory system: diminished breath sounds b/l Cardiovascular system: S1 & S2+. No rubs, gallops or clicks. B/l LE edema  Gastrointestinal system:  Abdomen is obese, soft and nontender. Normal bowel sounds heard. Central nervous system: Alert and oriented. Moves all extremities  Psychiatry: Judgement and insight appear normal. Mood & affect appropriate.     Data Reviewed: I have personally reviewed following labs and imaging studies  CBC: Recent Labs  Lab 01/26/22 1451 01/27/22 1834 01/28/22 0628  WBC 4.1 3.9* 3.4*  NEUTROABS 2.8 2.3  --   HGB 11.8* 12.3 12.9  HCT 37.5 39.7 40.8  MCV 83.0 83.4 83.1  PLT 204 218 417   Basic Metabolic Panel: Recent Labs  Lab 01/26/22 1451 01/27/22 1834 01/28/22 0628  NA 137 139 136  K 3.8 4.0 4.2  CL 104 101 99  CO2 '24 28 29  '$ GLUCOSE 106* 111* 164*  BUN '18 18 18  '$ CREATININE 0.77 0.77 0.69  CALCIUM 9.1 9.4 9.4   GFR: Estimated Creatinine Clearance: 52.6 mL/min (by C-G formula based on SCr of 0.69 mg/dL). Liver Function Tests: Recent Labs  Lab 01/26/22 1451  AST 23  ALT 16  ALKPHOS 67  BILITOT 0.6  PROT 6.9  ALBUMIN 3.9   No results for input(s): "LIPASE", "AMYLASE" in the last 168 hours. No results for input(s): "AMMONIA" in the last 168 hours. Coagulation Profile: No results for input(s): "INR", "PROTIME" in the last 168 hours. Cardiac Enzymes: No results for input(s): "CKTOTAL", "CKMB", "CKMBINDEX", "TROPONINI" in the last 168 hours. BNP (  last 3 results) No results for input(s): "PROBNP" in the last 8760 hours. HbA1C: No results for input(s): "HGBA1C" in the last 72 hours. CBG: No results for input(s): "GLUCAP" in the last 168 hours. Lipid Profile: No results for input(s): "CHOL", "HDL", "LDLCALC", "TRIG", "CHOLHDL", "LDLDIRECT" in the last 72 hours. Thyroid Function Tests: No results for input(s): "TSH", "T4TOTAL", "FREET4", "T3FREE", "THYROIDAB" in the last 72 hours. Anemia Panel: No results for input(s): "VITAMINB12", "FOLATE", "FERRITIN", "TIBC", "IRON", "RETICCTPCT" in the last 72 hours. Sepsis Labs: No results for input(s): "PROCALCITON", "LATICACIDVEN" in  the last 168 hours.  Recent Results (from the past 240 hour(s))  SARS Coronavirus 2 by RT PCR (hospital order, performed in [facility] hospital lab) *cepheid single result test* Anterior Nasal Swab     Status: None   Collection Time: 01/28/22  6:28 AM   Specimen: Anterior Nasal Swab  Result Value Ref Range Status   SARS Coronavirus 2 by RT PCR NEGATIVE NEGATIVE Final    Comment: (NOTE) SARS-CoV-2 target nucleic acids are NOT DETECTED.  The SARS-CoV-2 RNA is generally detectable in upper and lower respiratory specimens during the acute phase of infection. The lowest concentration of SARS-CoV-2 viral copies this assay can detect is 250 copies / mL. A negative result does not preclude SARS-CoV-2 infection and should not be used as the sole basis for treatment or other patient management decisions.  A negative result may occur with improper specimen collection / handling, submission of specimen other than nasopharyngeal swab, presence of viral mutation(s) within the areas targeted by this assay, and inadequate number of viral copies (<250 copies / mL). A negative result must be combined with clinical observations, patient history, and epidemiological information.  Fact Sheet for Patients:   https://www.patel.info/  Fact Sheet for Healthcare Providers: https://hall.com/  This test is not yet approved or  cleared by the [place] FDA and has been authorized for detection and/or diagnosis of SARS-CoV-2 by FDA under an Emergency Use Authorization (EUA).  This EUA will remain in effect (meaning this test can be used) for the duration of the COVID-19 declaration under Section 564(b)(1) of the Act, 21 U.S.C. section 360bbb-3(b)(1), unless the authorization is terminated or revoked sooner.  Performed at [facility], [address] [place], [place] [address]          Radiology Studies: DG Chest 2 View  Result Date:  01/27/2022 CLINICAL DATA:  Shortness of breath. EXAM: CHEST - 2 VIEW COMPARISON:  January 26, 2022 FINDINGS: There is stable mild to moderate severity enlargement of the cardiac silhouette. There is marked severity calcification of the aortic arch. Stable, diffusely increased interstitial lung markings are noted. Mild prominence of the pulmonary vasculature is seen. There is no evidence of focal consolidation, pleural effusion or pneumothorax. Multilevel degenerative changes are seen throughout the thoracic spine. IMPRESSION: 1. Stable cardiomegaly with mild pulmonary vascular congestion. Electronically Signed   By: Virgina Norfolk M.D.   On: 01/27/2022 19:23   DG Chest 2 View  Result Date: 01/26/2022 CLINICAL DATA:  Per Triage: Pt via POV from home. Pt c/o dry cough that started last weekend. States that last couple of days she has been coughing non-stop.Denies any chest pain. Denies any SOB. Denies fever. Pt is AANDOX4 and NAD EXAM: CHEST - 2 VIEW COMPARISON:  01/16/2019.  CT, 01/14/2019. FINDINGS: Stable enlargement of the cardiac silhouette. No mediastinal or hilar masses. No evidence of adenopathy. Prominent interstitial markings most evident in the bases, also stable. No evidence of  pneumonia or pulmonary edema. No pleural effusion or pneumothorax. Skeletal structures are grossly intact. IMPRESSION: 1. No acute cardiopulmonary disease. Electronically Signed   By: Lajean Manes M.D.   On: 01/26/2022 14:14        Scheduled Meds:  apixaban  2.5 mg Oral BID   calcium-vitamin D  1 tablet Oral BID WC   cholecalciferol  1,000 Units Oral Q breakfast   furosemide  40 mg Intravenous Q12H   ipratropium-albuterol  3 mL Nebulization Once   metoprolol tartrate  50 mg Oral BID   multivitamin-lutein   Oral BID WC   pantoprazole  40 mg Oral Q breakfast   potassium chloride SA  20 mEq Oral Q breakfast   vitamin B-12  1,000 mcg Oral Q breakfast   Continuous Infusions:   LOS: 1 day    Time spent: 33 mins      [name], MD [facility] Pager [phone]  If 7PM-7AM, please contact night-coverage [email or web address] 01/28/2022, 8:14 AM

## 2022-01-28 NOTE — Progress Notes (Signed)
*  PRELIMINARY RESULTS* Echocardiogram 2D Echocardiogram has been performed.  Sherrie Sport 01/28/2022, 12:37 PM

## 2022-01-28 NOTE — ED Notes (Signed)
Lunch tray provided to patient.

## 2022-01-28 NOTE — Consult Note (Signed)
[facility]  CARDIOLOGY CONSULT NOTE  Patient ID: Cathy Spears MRN: [id number] DOB/AGE: November 08, 1935 86 y.o.  Admit date: 01/27/2022 Referring Physician Eugenie Norrie Primary Physician Rusty Aus, MD Primary Cardiologist Miquel Dunn Reason for Consultation Volume overload  HPI:  Cathy Spears is an 86 year old female with history of paroxysmal A-fib and SVT, hypertension, mild aortic stenosis, breast cancer who presented to the hospital on 01/27/2022 with complaints of cough and inability to breathe.  Cardiology is consulted for volume overload.  History is somewhat difficult to obtain since the patient primarily speaks [place], but does speak some English with additional history provided by her daughter.  Reportedly she has a chronic cough that is somewhat attributed to reflux, and over the last 3 to 4 weeks her family has noticed her cough is worsened somewhat.  She has had a wedding last weekend, and after coming home in the last 3 to 4 days her cough is significantly worsened.  Her cough is nonproductive and since Saturday when she develops a coughing fit is extremely hard for her to catch her breath.  She feels as though something is stuck in her throat.  She has a history of chronic lower extremity edema for which she takes Lasix as needed, and following the wedding she was that last weekend she has noticed some increased lower extremity edema for which she has been taking Lasix all week.  She has no weight gain, but has increased coughing when she lays flat.  She had a severe coughing fit on 01/27/2022 prompting her presentation to the emergency department.  Cardiac review of systems otherwise negative for chest pain, shortness of breath with exertion, syncope, presyncope.  On arrival to the emergency department her heart rate is 79-1 06 in atrial fibrillation, blood pressure is 114/68.  Labs are notable for a normal creatinine, and a troponin of 9-9.  BNP mildly elevated at 168 2 days ago.   Chest x-ray shows mild pulmonary vascular congestion.  Review of systems complete and found to be negative unless listed above     Past Medical History:  Diagnosis Date   Anxiety    Breast cancer ([place])    Cancer ([place])    ,bilateral lymph nodes removed  breast mostly from the left   Cataracts, bilateral    Complication of anesthesia    Dysrhythmia    hx of AF   Esophageal erosions    GERD (gastroesophageal reflux disease)    Hypertension    dr Saralyn Pilar  at [facility] clinic   Personal history of radiation therapy    PONV (postoperative nausea and vomiting)    spinal anesthesia x1    Past Surgical History:  Procedure Laterality Date   BALLOON DILATION N/A 09/24/2017   Procedure: BALLOON DILATION;  Surgeon: Toledo, Benay Pike, MD;  Location: [facility];  Service: Gastroenterology;  Laterality: N/A;   BREAST BIOPSY Right    BREAST LUMPECTOMY Bilateral    2010   BREAST SURGERY     lumpectomy bil,pt. states lymph nodes were removed and  the left arm is restricted   COLONOSCOPY WITH ESOPHAGOGASTRODUODENOSCOPY (EGD)     ESOPHAGOGASTRODUODENOSCOPY (EGD) WITH PROPOFOL N/A 09/24/2017   Procedure: ESOPHAGOGASTRODUODENOSCOPY (EGD) WITH PROPOFOL;  Surgeon: Toledo, Benay Pike, MD;  Location: [facility];  Service: Gastroenterology;  Laterality: N/A;   HERNIA REPAIR     LUMBAR LAMINECTOMY/DECOMPRESSION MICRODISCECTOMY  08/22/2011   Procedure: LUMBAR LAMINECTOMY/DECOMPRESSION MICRODISCECTOMY;  Surgeon: Ophelia Charter, MD;  Location: [facility];  Service: Neurosurgery;  Laterality: Right;  RIGHT Lumbar five sacral one  laminectomy and microdiscectomy   TOTAL KNEE ARTHROPLASTY Right 01/14/2019   Procedure: TOTAL KNEE ARTHROPLASTY RIGHT;  Surgeon: Corky Mull, MD;  Location: [facility];  Service: Orthopedics;  Laterality: Right;    (Not in a hospital admission)  Social History   Socioeconomic History   Marital status: Married    Spouse name: Not on file   Number of children: Not  on file   Years of education: Not on file   Highest education level: Not on file  Occupational History   Not on file  Tobacco Use   Smoking status: Never   Smokeless tobacco: Never  Vaping Use   Vaping Use: Never used  Substance and Sexual Activity   Alcohol use: No   Drug use: No   Sexual activity: Not on file  Other Topics Concern   Not on file  Social History Narrative   Not on file   Social Determinants of Health   Financial Resource Strain: Not on file  Food Insecurity: Not on file  Transportation Needs: Not on file  Physical Activity: Not on file  Stress: Not on file  Social Connections: Not on file  Intimate Partner Violence: Not on file    History reviewed. No pertinent family history.    Review of systems complete and found to be negative unless listed above      PHYSICAL EXAM  General: Well developed, well nourished, in no acute distress HEENT:  Normocephalic and atramatic Neck:  No JVD.  Lungs: Clear bilaterally to auscultation and percussion. Heart: Irregularly irregular . Normal S1 and S2 without gallops or murmurs.  Abdomen: Bowel sounds are positive, abdomen soft and non-tender  Msk:  Back normal, normal gait. Normal strength and tone for age. Extremities: No clubbing, cyanosis or edema.   Neuro: Alert and oriented X 3. Psych:  Good affect, responds appropriately  Labs:   Lab Results  Component Value Date   WBC 3.4 (L) 01/28/2022   HGB 12.9 01/28/2022   HCT 40.8 01/28/2022   MCV 83.1 01/28/2022   PLT 222 01/28/2022    Recent Labs  Lab 01/26/22 1451 01/27/22 1834 01/28/22 0628  NA 137   < > 136  K 3.8   < > 4.2  CL 104   < > 99  CO2 24   < > 29  BUN 18   < > 18  CREATININE 0.77   < > 0.69  CALCIUM 9.1   < > 9.4  PROT 6.9  --   --   BILITOT 0.6  --   --   ALKPHOS 67  --   --   ALT 16  --   --   AST 23  --   --   GLUCOSE 106*   < > 164*   < > = values in this interval not displayed.   Lab Results  Component Value Date    TROPONINI <0.03 01/15/2019   No results found for: "CHOL" No results found for: "HDL" No results found for: "LDLCALC" No results found for: "TRIG" No results found for: "CHOLHDL" No results found for: "LDLDIRECT"    Radiology: DG Chest 2 View  Result Date: 01/27/2022 CLINICAL DATA:  Shortness of breath. EXAM: CHEST - 2 VIEW COMPARISON:  January 26, 2022 FINDINGS: There is stable mild to moderate severity enlargement of the cardiac silhouette. There is marked severity calcification of the aortic arch. Stable, diffusely increased interstitial lung markings are  noted. Mild prominence of the pulmonary vasculature is seen. There is no evidence of focal consolidation, pleural effusion or pneumothorax. Multilevel degenerative changes are seen throughout the thoracic spine. IMPRESSION: 1. Stable cardiomegaly with mild pulmonary vascular congestion. Electronically Signed   By: Virgina Norfolk M.D.   On: 01/27/2022 19:23   DG Chest 2 View  Result Date: 01/26/2022 CLINICAL DATA:  Per Triage: Pt via POV from home. Pt c/o dry cough that started last weekend. States that last couple of days she has been coughing non-stop.Denies any chest pain. Denies any SOB. Denies fever. Pt is AANDOX4 and NAD EXAM: CHEST - 2 VIEW COMPARISON:  01/16/2019.  CT, 01/14/2019. FINDINGS: Stable enlargement of the cardiac silhouette. No mediastinal or hilar masses. No evidence of adenopathy. Prominent interstitial markings most evident in the bases, also stable. No evidence of pneumonia or pulmonary edema. No pleural effusion or pneumothorax. Skeletal structures are grossly intact. IMPRESSION: 1. No acute cardiopulmonary disease. Electronically Signed   By: Lajean Manes M.D.   On: 01/26/2022 14:14    EKG: Atrial fibrillation, HR 104. PVC.   ASSESSMENT AND PLAN:  Cathy Spears is an 86 year old female with history of paroxysmal A-fib and SVT, hypertension, mild aortic stenosis, breast cancer who presented to the hospital on 01/27/2022  with complaints of cough and inability to breathe.  Cardiology is consulted for volume overload.  # Cough # Shortness of breath # Acute on chronic HFpEF, mild AS The patient appears to be mildly volume overloaded possibly in the setting of dietary indiscretions from a wedding last week and the fact that she is currently in AF.  She additionally has a chronic cough that is worsened, and sounds to be like it may be viral in nature and worsened by vocal cord spasms.  Could also have some contribution of the frequency of her cough from mild pulmonary edema from volume overload.  BNP mildly elevated at 160, decreased to 129 on day of presentation. - Continue diuresis 40 mg IV twice daily today.  Plans to transition to p.o. tomorrow -Treatment of atrial fibrillation as below. - Echocardiogram today  # Paroxysmal AF Has had intermittent recurrences of atrial fibrillation since 2020 after a orthopedic surgery.  Has been in atrial fibrillation since admission which is likely contributing to her heart failure exacerbation. -Continue Eliquis 2.5 mg twice daily (unclear why dose reduced)--> If no contraindication, would recommend increasing to 5 mg BID - Continue metoprolol 50 mg BID - Would resume home ditiazem   Signed: Andrez Grime MD 01/28/2022, 9:03 AM

## 2022-01-28 NOTE — Consult Note (Signed)
Cathy, Spears [id number] 11-22-1935 Cathy Dusky, MD  Reason for Consult: Globus, cough  HPI: Cathy Spears is a 86 y.o. female who presented to the ED with cough and sensation of the inability to breathe with a globus sensation.  Her cough started four days ago and the sensation of the inability to breathe started yesterday.  No history of similar symptoms.  History is somewhat difficult to obtain from the patient, so the history is supplemented by her daughter.  Patient has known history of GERD and had an esophageal dilation three years ago.  Denies dysphagia to solids or liquids.  When asked, she cannot describe if there is any stridor during the episodes of not being able to breathe.  Her daughter states that she is still able to move air during theses episodes.  Denies allergies.  Endorses some COPD.  Denies smoking but has history of exposure to second hand smoke.    Allergies:  Allergies  Allergen Reactions   Fish-Derived Products Swelling    Blue Fish years ago.  Caused redness over all of face   Oxycodone Swelling    ROS: Review of systems normal other than 12 systems except per HPI.  PMH:  Past Medical History:  Diagnosis Date   Anxiety    Breast cancer ([place])    Cancer ([place])    ,bilateral lymph nodes removed  breast mostly from the left   Cataracts, bilateral    Complication of anesthesia    Dysrhythmia    hx of AF   Esophageal erosions    GERD (gastroesophageal reflux disease)    Hypertension    dr Saralyn Pilar  at [facility] clinic   Personal history of radiation therapy    PONV (postoperative nausea and vomiting)    spinal anesthesia x1    FH: History reviewed. No pertinent family history.  SH:  Social History   Socioeconomic History   Marital status: Married    Spouse name: Not on file   Number of children: Not on file   Years of education: Not on file   Highest education level: Not on file  Occupational History   Not on file  Tobacco Use    Smoking status: Never   Smokeless tobacco: Never  Vaping Use   Vaping Use: Never used  Substance and Sexual Activity   Alcohol use: No   Drug use: No   Sexual activity: Not on file  Other Topics Concern   Not on file  Social History Narrative   Not on file   Social Determinants of Health   Financial Resource Strain: Not on file  Food Insecurity: Not on file  Transportation Needs: Not on file  Physical Activity: Not on file  Stress: Not on file  Social Connections: Not on file  Intimate Partner Violence: Not on file    PSH:  Past Surgical History:  Procedure Laterality Date   BALLOON DILATION N/A 09/24/2017   Procedure: BALLOON DILATION;  Surgeon: Toledo, Benay Pike, MD;  Location: [facility];  Service: Gastroenterology;  Laterality: N/A;   BREAST BIOPSY Right    BREAST LUMPECTOMY Bilateral    2010   BREAST SURGERY     lumpectomy bil,pt. states lymph nodes were removed and  the left arm is restricted   COLONOSCOPY WITH ESOPHAGOGASTRODUODENOSCOPY (EGD)     ESOPHAGOGASTRODUODENOSCOPY (EGD) WITH PROPOFOL N/A 09/24/2017   Procedure: ESOPHAGOGASTRODUODENOSCOPY (EGD) WITH PROPOFOL;  Surgeon: Toledo, Benay Pike, MD;  Location: [facility];  Service: Gastroenterology;  Laterality: N/A;  HERNIA REPAIR     LUMBAR LAMINECTOMY/DECOMPRESSION MICRODISCECTOMY  08/22/2011   Procedure: LUMBAR LAMINECTOMY/DECOMPRESSION MICRODISCECTOMY;  Surgeon: Ophelia Charter, MD;  Location: [facility];  Service: Neurosurgery;  Laterality: Right;  RIGHT Lumbar five sacral one  laminectomy and microdiscectomy   TOTAL KNEE ARTHROPLASTY Right 01/14/2019   Procedure: TOTAL KNEE ARTHROPLASTY RIGHT;  Surgeon: Corky Mull, MD;  Location: [facility];  Service: Orthopedics;  Laterality: Right;    Physical  Exam: CN 2-12 grossly intact and symmetric.  Normal voice and no stridor. EAC/TMs normal BL. Oral cavity, lips, gums, ororpharynx normal with no masses or lesions. Skin warm and dry. Nasal cavity without  polyps or purulence. External nose and ears without masses or lesions. EOMI, PERRLA. Neck supple with no masses or lesions. No lymphadenopathy palpated. Thyroid normal with no masses.   A/P: Cathy Spears is a 86 y.o. female with cough and sensation of inability to breathe.  TFL normal with no masses obstruction.  Cough can have multiple sources including reflux, pulmonary pathology, allergies, medication side effects among others.  Patient has multiple sources of cough including reflux, CHF, and COPD.  One of these or a combination of these could be contributing to cough.  Feelings of her "throat closing up" or something in her throat likely related to reflux and/or anxiety.  Patient does not describe episodes of stridor when she cannot breathe.  Could consider speech pathology for recommendations on exercises for paradoxical vocal fold motion (if this diagnosis is accurate).  No ENT intervention at this time.  Call with questions.   Winfred Burn 01/28/2022 2:52 PM

## 2022-01-28 NOTE — ED Notes (Signed)
RN aware bed assigned ?

## 2022-01-28 NOTE — ED Notes (Signed)
Echo at bedside

## 2022-01-28 NOTE — Progress Notes (Signed)
   01/28/22 1740  Vitals  Temp 97.6 F (36.4 C)  Temp Source Oral  BP 137/82  MAP (mmHg) 95  BP Location Right Arm  BP Method Automatic  Patient Position (if appropriate) Lying  Pulse Rate (!) 105  Pulse Rate Source Dinamap  ECG Heart Rate (!) 125  Resp (!) 27  Level of Consciousness  Level of Consciousness Alert  MEWS COLOR  MEWS Score Color Red  Oxygen Therapy  SpO2 95 %  O2 Device Room Air  Pain Assessment  Pain Scale 0-10  Pain Score 0  MEWS Score  MEWS Temp 0  MEWS Systolic 0  MEWS Pulse 2  MEWS RR 2  MEWS LOC 0  MEWS Score 4   Pt admitted to floor and vitals were taken upon arrival. Will notify Dr. Jimmye Norman and Amy D CN

## 2022-01-28 NOTE — Progress Notes (Signed)
Admission profile updated. ?

## 2022-01-28 NOTE — ED Notes (Signed)
[place] removed per MD request.

## 2022-01-28 NOTE — Evaluation (Signed)
Clinical/Bedside Swallow Evaluation Patient Details  Name: Cathy Spears MRN: [id number] Date of Birth: 10-23-1935  Today's Date: 01/28/2022 Time: SLP Start Time (ACUTE ONLY): 1500 SLP Stop Time (ACUTE ONLY): 1600 SLP Time Calculation (min) (ACUTE ONLY): 60 min  Past Medical History:  Past Medical History:  Diagnosis Date   Anxiety    Breast cancer ([place])    Cancer ([place])    ,bilateral lymph nodes removed  breast mostly from the left   Cataracts, bilateral    Complication of anesthesia    Dysrhythmia    hx of AF   Esophageal erosions    GERD (gastroesophageal reflux disease)    Hypertension    dr Saralyn Pilar  at [facility]   Personal history of radiation therapy    PONV (postoperative nausea and vomiting)    spinal anesthesia x1   Past Surgical History:  Past Surgical History:  Procedure Laterality Date   BALLOON DILATION N/A 09/24/2017   Procedure: BALLOON DILATION;  Surgeon: Toledo, Benay Pike, MD;  Location: [facility] ENDOSCOPY;  Service: Gastroenterology;  Laterality: N/A;   BREAST BIOPSY Right    BREAST LUMPECTOMY Bilateral    2010   BREAST SURGERY     lumpectomy bil,pt. states lymph nodes were removed and  the left arm is restricted   COLONOSCOPY WITH ESOPHAGOGASTRODUODENOSCOPY (EGD)     ESOPHAGOGASTRODUODENOSCOPY (EGD) WITH PROPOFOL N/A 09/24/2017   Procedure: ESOPHAGOGASTRODUODENOSCOPY (EGD) WITH PROPOFOL;  Surgeon: Toledo, Benay Pike, MD;  Location: [facility] ENDOSCOPY;  Service: Gastroenterology;  Laterality: N/A;   HERNIA REPAIR     LUMBAR LAMINECTOMY/DECOMPRESSION MICRODISCECTOMY  08/22/2011   Procedure: LUMBAR LAMINECTOMY/DECOMPRESSION MICRODISCECTOMY;  Surgeon: Ophelia Charter, MD;  Location: [place] [facility];  Service: Neurosurgery;  Laterality: Right;  RIGHT Lumbar five sacral one  laminectomy and microdiscectomy   TOTAL KNEE ARTHROPLASTY Right 01/14/2019   Procedure: TOTAL KNEE ARTHROPLASTY RIGHT;  Surgeon: Corky Mull, MD;  Location: [facility] ORS;  Service: Orthopedics;   Laterality: Right;   HPI:  Pt is a 86 y.o. female who presented to the ED with cough and sensation of the inability to breathe with a globus sensation.  Her cough started four days ago and the sensation of the inability to breathe started yesterday.  No history of similar symptoms.  History is supplemented by her daughter(pt speaks [place] and [place]).  Patient has known history of GERD and Esophageal Erosions; she had an Esophageal Dilation three years ago.  She has an appointment to be seen by GI but has not gone yet..  Denies dysphagia to solids or liquids.  When asked, she cannot describe if there is any stridor during the episodes of not being able to breathe.  Her daughter states that she is still able to move air during theses episodes.  Denies allergies.  Endorses some COPD.  Denies smoking but has history of exposure to second hand smoke.   Pt c/o dry cough started last weekend. States that last couple of days she has been coughing non-stop. Denies any chest pain. Denies any SOB. Denies fever. Pt is A&O x4. Noted ENT consult note.   Assessment / Plan / Recommendation  Clinical Impression  Pt seen for BSE after lunch meal arrived. Pt is on a Regular diet. Pt was sitting in chair; family present in ED room. Pt was talkative; on RA, afebrile. Pt c/o dry cough started last weekend stating in the last couple of days, she has been coughing more frequently. Denies any chest pain. Denies any SOB. She endorses  PHLEGM. Pt is also having INCREASED fluid overload being managed w/ Lasix though not on a constant does as of yet per pt/family.   Pt appears to present w/ adequate oropharyngeal phase swallowing function w/ No overt oropharyngeal phase dysphagia appreciated w/ trials; No neuromuscular swallowing deficits appreciated. ALSO, no overt coughing nor difficulty breathing occurred during the po trials -- no apparent laryngospasms. Pt denied any difficulty swallowing but does endorse s/s of Esophageal  dysmotility at times, including Phlegm AND endorses fluid overload issues. Pt appears at reduced risk for aspiration from an oropharyngeal phase standpoint following general aspiration precautions.  Suspect pt's Baseline issues of GERD and Esophageal Dysmotility along w/ fluid overload could be increasing Cough.  Noted ENT consult note today.   Pt and Family endorsed coughing much past ALL po's w/ need to clear Phelgm -- pt and Family reacted to this as what "catches in her throat causing it to be difficult for her to breath". Pt agreed. Noted pt coughed, cleared throat, then swallowed the Phlegm this instance.  Pt has a baseline of GERD, on a PPI of '40mg'$  2x daily (30 mins b/f meal) per Family/medicine bottle from home. Pt stated she has been on "this medicine for years". W/ her ongoing dx of GERD and Esophageal Dilation previously, and potentially new increased episodes of Phelgm/REFLUX, ANY Esophageal phase Dysmotility can have impact and increase coughing AND also increase risk for Regurgitation of Reflux material which can then increase risk of aspiration of the Reflux material during any Retrograde backflow -- this can impact Voicing and Pulmonary status.     Pt did not endorse any specific issues w/ food; no c/o Esophageal discomfort w/ food nor drink. Just c/o the "dry" cough after meals -- this was apparent AFTER her Lunch meal today: the Family and pt noted dry coughing (x3 episodes w/ the potential Phlegm feelings ~1 hour AFTER the meal). Discussed general Esophageal phase motility and any potential dysmotility anywhere along the Esophagus resulting in a "back-up" of food/liquid boluses and/or Phlegm towards end of meal and after.  Pt sat upright in the chair supported along lower back to avoid bending/crunching in the stomach area. She consumed trials of thin liquids via bottle after the bites at lunch meal(meat, broccoli) w/ No immediate, overt clinical s/s of aspiration noted; clear vocal  quality b/t trials, no decline in pulmonary status, no multiple swallows noted post initial pharyngeal swallow. Oral phase appeared [facility] for bolus management and timely A-P transfer/clearing of material. OM exam was [facility] for oral clearing; lingual/labial movements. No unilateral weakness. Speech clear. intelligible. She denied a globus feeling AND denied any SOB or difficulty breathing; no spasm behavior. Pt did cough and clear Phlegm x1 w/ re-swallow - slightly harsh cough noted.        Recommend continue Regluar diet for ease of choice of manageable foods as tolerates(w/ less problematic foods/bulky foods such as meats/breads in diet and particulates recommended; all foods should be well-moistened cohesive foods) w/ Thin liquids via Cup/less straw use d/t air swallowing -- pt prefers Cup use. General aspiration precautions -- including Small Sips/Bites. TIME during meals/oral intake to allow for Esophageal clearing. REFLUX precautions strongly recommended to lessen chance for Regurgitation. Use of Applesauce when swallowing Pills for cohesion IF needed.     Recommend pt continue f/u w/ GI for management of GERD, dysmotility and tx as indicated. Pt can f/u w/ ENT/PCP if further concern for laryngospasms for a referral to Outpatient ST services. MD to reconsult  ST services if any new needs while admitted. Pt's questions answered; pt and Wife had no further questions. SLP Visit Diagnosis: Dysphagia, unspecified (R13.10)    Aspiration Risk   (reduced following general precautions including REFLUX precs.)    Diet Recommendation   Regluar diet for ease of choice of manageable foods as tolerates(w/ less problematic foods/bulky foods such as meats/breads in diet and particulates recommended; all foods should be well-moistened cohesive foods) w/ Thin liquids via Cup/less straw use d/t air swallowing -- pt prefers Cup use. General aspiration precautions -- including Small Sips/Bites. TIME during meals/oral intake to  allow for Esophageal clearing. REFLUX precautions strongly recommended to lessen chance for Regurgitation.  Medication Administration: Whole meds with liquid (or whole in a puree for cohesion of easier for swallow)    Other  Recommendations Recommended Consults: Consider GI evaluation (ENT consulted this admit) Oral Care Recommendations: Oral care BID;Oral care before and after PO;Patient independent with oral care Other Recommendations:  (n/a)    Recommendations for follow up therapy are one component of a multi-disciplinary discharge planning process, led by the attending physician.  Recommendations may be updated based on patient status, additional functional criteria and insurance authorization.  Follow up Recommendations No SLP follow up (unless per ENT)      Assistance Recommended at Discharge None  Functional Status Assessment Patient has had a recent decline in their functional status and demonstrates the ability to make significant improvements in function in a reasonable and predictable amount of time.  Frequency and Duration  (n/a)   (n/a)       Prognosis Prognosis for Safe Diet Advancement: Good Barriers to Reach Goals: Time post onset;Severity of deficits Barriers/Prognosis Comment: GERD(baseline); and potentially fluid overload for which she is currently on Lasix      Swallow Study   General Date of Onset: 01/26/22 HPI: Pt is a 86 y.o. female who presented to the ED with cough and sensation of the inability to breathe with a globus sensation.  Her cough started four days ago and the sensation of the inability to breathe started yesterday.  No history of similar symptoms.  History is supplemented by her daughter(pt speaks [place] and [place]).  Patient has known history of GERD and Esophageal Erosions; she had an Esophageal Dilation three years ago.  She has an appointment to be seen by GI but has not gone yet..  Denies dysphagia to solids or liquids.  When asked, she cannot  describe if there is any stridor during the episodes of not being able to breathe.  Her daughter states that she is still able to move air during theses episodes.  Denies allergies.  Endorses some COPD.  Denies smoking but has history of exposure to second hand smoke.  Pt c/o dry cough started last weekend. States that last couple of days she has been coughing non-stop. Denies any chest pain. Denies any SOB. Denies fever. Pt is A&O x4. Type of Study: Bedside Swallow Evaluation Previous Swallow Assessment: none Diet Prior to this Study: Regular;Thin liquids Temperature Spikes Noted: No (wbc 3.4) Respiratory Status: Room air History of Recent Intubation: No Behavior/Cognition: Alert;Cooperative;Pleasant mood Oral Cavity Assessment: Within Functional Limits Oral Care Completed by SLP: Recent completion by staff Oral Cavity - Dentition: Adequate natural dentition Vision: Functional for self-feeding Self-Feeding Abilities: Able to feed self Patient Positioning: Upright in chair Baseline Vocal Quality: Normal Volitional Cough: Strong Volitional Swallow: Able to elicit    Oral/Motor/Sensory Function Overall Oral Motor/Sensory Function: Within functional limits (w/  bolus management, clearing; Speech)   Ice Chips Ice chips: Not tested   Thin Liquid Thin Liquid: Within functional limits Presentation: Self Fed;Cup (bottle; ~4+ ozs total)    Nectar Thick Nectar Thick Liquid: Not tested   Honey Thick Honey Thick Liquid: Not tested   Puree Puree: Not tested   Solid     Solid: Within functional limits Presentation: Self Fed;Spoon (lunch meal of brocoli, meatloaf, potatoes) Other Comments: no episodes of coughing        Orinda Kenner, MS, CCC-SLP Speech Language Pathologist Rehab Services; [place] [phone] (ascom) Michella Detjen 01/28/2022,5:17 PM

## 2022-01-28 NOTE — Procedures (Signed)
Date: 01/28/22  Preoperative diagnosis:  Globus  Postoperative diagnosis:  Same  Procedure:  Flexible laryngoscopy  Findings:  No masses in the nasal cavity on the left, nasopharynx, oropharynx, hypopharynx or vocal fold.  Preserved vocal fold motion bilaterally.    Anesthesia:  None  Procedure:  After consent, I placed the flexible laryngoscope through the left naris and advanced it towards the nasopharynx.  The laryngoscope was flex downward and used to examine the oropharynx, hypopharynx, vocal folds, and supraglottic regions.  The patient was asked to phonate to evaluate the mobility of the vocal folds.  The laryngoscope was removed and the patient tolerated the procedure well.  The findings are above.

## 2022-01-28 NOTE — ED Notes (Signed)
Pt ambulated to BR with steady gait. Pt set up in chair in room to watch TV and visit with family. Pt tolerated well. Denies further needs at this time.

## 2022-01-28 NOTE — ED Notes (Signed)
ENT at bedside. Laryngoscope at bedside.

## 2022-01-29 DIAGNOSIS — I509 Heart failure, unspecified: Secondary | ICD-10-CM | POA: Diagnosis not present

## 2022-01-29 DIAGNOSIS — E669 Obesity, unspecified: Secondary | ICD-10-CM | POA: Diagnosis not present

## 2022-01-29 DIAGNOSIS — I480 Paroxysmal atrial fibrillation: Secondary | ICD-10-CM | POA: Diagnosis not present

## 2022-01-29 LAB — BASIC METABOLIC PANEL
Anion gap: 11 (ref 5–15)
BUN: 24 mg/dL — ABNORMAL HIGH (ref 8–23)
CO2: 28 mmol/L (ref 22–32)
Calcium: 9.4 mg/dL (ref 8.9–10.3)
Chloride: 93 mmol/L — ABNORMAL LOW (ref 98–111)
Creatinine, Ser: 0.69 mg/dL (ref 0.44–1.00)
GFR, Estimated: >60 mL/min (ref >60)
Glucose, Bld: 122 mg/dL — ABNORMAL HIGH (ref 70–99)
Potassium: 3.6 mmol/L (ref 3.5–5.1)
Sodium: 132 mmol/L — ABNORMAL LOW (ref 135–145)

## 2022-01-29 LAB — CBC
HCT: 41.5 % (ref 36.0–46.0)
Hemoglobin: 13.7 g/dL (ref 12.0–15.0)
MCH: 26.3 pg (ref 26.0–34.0)
MCHC: 33 g/dL (ref 30.0–36.0)
MCV: 79.7 fL — ABNORMAL LOW (ref 80.0–100.0)
Platelets: 262 10*3/uL (ref 150–400)
RBC: 5.21 MIL/uL — ABNORMAL HIGH (ref 3.87–5.11)
RDW: 15.3 % (ref 11.5–15.5)
WBC: 7.7 10*3/uL (ref 4.0–10.5)
nRBC: 0 % (ref 0.0–0.2)

## 2022-01-29 MED ORDER — FUROSEMIDE 40 MG PO TABS: 40.0000 mg | ORAL_TABLET | Freq: Every day | ORAL | 0 refills | Status: DC

## 2022-01-29 MED ORDER — FUROSEMIDE 40 MG PO TABS: 40.0000 mg | ORAL_TABLET | Freq: Every day | ORAL | Status: DC

## 2022-01-29 MED ORDER — POTASSIUM CHLORIDE CRYS ER 20 MEQ PO TBCR: 20.0000 meq | EXTENDED_RELEASE_TABLET | Freq: Every day | ORAL | 0 refills | Status: DC

## 2022-01-29 MED ORDER — GUAIFENESIN ER 600 MG PO TB12: 600.0000 mg | ORAL_TABLET | Freq: Two times a day (BID) | ORAL | 0 refills | Status: AC | PRN

## 2022-01-29 MED ORDER — APIXABAN 5 MG PO TABS: 5.0000 mg | ORAL_TABLET | Freq: Two times a day (BID) | ORAL | 0 refills | Status: AC

## 2022-01-29 NOTE — Consult Note (Signed)
[facility] Cardiology  CARDIOLOGY CONSULT NOTE  Patient ID: Cathy Spears MRN: [id number] DOB/AGE: Apr 16, 1936 86 y.o.  Admit date: 01/27/2022 Referring Physician Eugenie Norrie Primary Physician Rusty Aus, MD Primary Cardiologist Miquel Dunn Reason for Consultation Volume overload  HPI:  Cathy Spears is an 86 year old female with history of paroxysmal A-fib and SVT, hypertension, mild aortic stenosis, breast cancer who presented to the hospital on 01/27/2022 with complaints of cough and inability to breathe.  Cardiology is consulted for volume overload.  Interval history: - Feels improved this morning. Still having some coughing fits occasionally, but seems to be less often and managed by breathing exercises per speech pathology.  - Edema in her legs has resolved. Skinniest her legs have been in years.  - No chest pain.   Review of systems complete and found to be negative unless listed above     Past Medical History:  Diagnosis Date   Anxiety    Breast cancer ([place])    Cancer ([place])    ,bilateral lymph nodes removed  breast mostly from the left   Cataracts, bilateral    Complication of anesthesia    Dysrhythmia    hx of AF   Esophageal erosions    GERD (gastroesophageal reflux disease)    Hypertension    dr Saralyn Pilar  at [facility]   Personal history of radiation therapy    PONV (postoperative nausea and vomiting)    spinal anesthesia x1    Past Surgical History:  Procedure Laterality Date   BALLOON DILATION N/A 09/24/2017   Procedure: BALLOON DILATION;  Surgeon: Toledo, Benay Pike, MD;  Location: [facility] ENDOSCOPY;  Service: Gastroenterology;  Laterality: N/A;   BREAST BIOPSY Right    BREAST LUMPECTOMY Bilateral    2010   BREAST SURGERY     lumpectomy bil,pt. states lymph nodes were removed and  the left arm is restricted   COLONOSCOPY WITH ESOPHAGOGASTRODUODENOSCOPY (EGD)     ESOPHAGOGASTRODUODENOSCOPY (EGD) WITH PROPOFOL N/A 09/24/2017   Procedure:  ESOPHAGOGASTRODUODENOSCOPY (EGD) WITH PROPOFOL;  Surgeon: Toledo, Benay Pike, MD;  Location: [facility] ENDOSCOPY;  Service: Gastroenterology;  Laterality: N/A;   HERNIA REPAIR     LUMBAR LAMINECTOMY/DECOMPRESSION MICRODISCECTOMY  08/22/2011   Procedure: LUMBAR LAMINECTOMY/DECOMPRESSION MICRODISCECTOMY;  Surgeon: Ophelia Charter, MD;  Location: [facility];  Service: Neurosurgery;  Laterality: Right;  RIGHT Lumbar five sacral one  laminectomy and microdiscectomy   TOTAL KNEE ARTHROPLASTY Right 01/14/2019   Procedure: TOTAL KNEE ARTHROPLASTY RIGHT;  Surgeon: Corky Mull, MD;  Location: [facility] ORS;  Service: Orthopedics;  Laterality: Right;    Medications Prior to Admission  Medication Sig Dispense Refill Last Dose   Calcium Carbonate-Vitamin D (TH CALCIUM CARBONATE-VITAMIN D) 600-400 MG-UNIT tablet Take 1 tablet by mouth 2 (two) times daily.   01/27/2022 at 0800   cholecalciferol (VITAMIN D) 1000 UNITS tablet Take 1,000 Units by mouth daily.   01/27/2022 at 0800   diltiazem (CARDIZEM CD) 120 MG 24 hr capsule Take 1 capsule (120 mg total) by mouth daily. 30 capsule 0 01/27/2022 at 0800   furosemide (LASIX) 20 MG tablet Take 2 tablets (40 mg total) by mouth daily for 3 days, THEN 1 tablet (20 mg total) daily as needed for up to 27 days for edema or fluid. 30 tablet 0 01/27/2022 at 0800   metoprolol (LOPRESSOR) 50 MG tablet Take 50 mg by mouth 2 (two) times daily.   01/27/2022 at 0800   Multiple Vitamins-Minerals (PRESERVISION AREDS PO) Take 1 tablet by mouth 2 (  two) times daily.   01/27/2022 at 0800   omeprazole (PRILOSEC) 40 MG capsule Take 40 mg by mouth 2 (two) times a day.    01/27/2022 at 0800   potassium chloride SA (KLOR-CON M) 20 MEQ tablet Take one tablet (20 mEq) with your FUROSEMIDE dose on days when you are taking it. 30 tablet 0 01/27/2022 at 0800   vitamin B-12 (CYANOCOBALAMIN) 1000 MCG tablet Take 1,000 mcg by mouth daily.   01/27/2022 at 0800    Social History   Socioeconomic History   Marital status:  Married    Spouse name: Not on file   Number of children: Not on file   Years of education: Not on file   Highest education level: Not on file  Occupational History   Not on file  Tobacco Use   Smoking status: Never   Smokeless tobacco: Never  Vaping Use   Vaping Use: Never used  Substance and Sexual Activity   Alcohol use: No   Drug use: No   Sexual activity: Not on file  Other Topics Concern   Not on file  Social History Narrative   Not on file   Social Determinants of Health   Financial Resource Strain: Not on file  Food Insecurity: Not on file  Transportation Needs: Not on file  Physical Activity: Not on file  Stress: Not on file  Social Connections: Not on file  Intimate Partner Violence: Not on file    History reviewed. No pertinent family history.    Review of systems complete and found to be negative unless listed above      PHYSICAL EXAM  General: Well developed, well nourished, in no acute distress HEENT:  Normocephalic and atramatic Neck:  No JVD.  Lungs: Clear bilaterally to auscultation and percussion. Heart: Irregularly irregular . Normal S1 and S2 without gallops or murmurs.  Abdomen: Bowel sounds are positive, abdomen soft and non-tender  Msk:  Back normal, normal gait. Normal strength and tone for age. Extremities: No clubbing, cyanosis or edema.   Neuro: Alert and oriented X 3. Psych:  Good affect, responds appropriately  Labs:   Lab Results  Component Value Date   WBC 7.7 01/29/2022   HGB 13.7 01/29/2022   HCT 41.5 01/29/2022   MCV 79.7 (L) 01/29/2022   PLT 262 01/29/2022    Recent Labs  Lab 01/26/22 1451 01/27/22 1834 01/29/22 0549  NA 137   < > 132*  K 3.8   < > 3.6  CL 104   < > 93*  CO2 24   < > 28  BUN 18   < > 24*  CREATININE 0.77   < > 0.69  CALCIUM 9.1   < > 9.4  PROT 6.9  --   --   BILITOT 0.6  --   --   ALKPHOS 67  --   --   ALT 16  --   --   AST 23  --   --   GLUCOSE 106*   < > 122*   < > = values in this  interval not displayed.    Lab Results  Component Value Date   TROPONINI <0.03 01/15/2019   No results found for: "CHOL" No results found for: "HDL" No results found for: "[place]" No results found for: "TRIG" No results found for: "CHOLHDL" No results found for: "LDLDIRECT"    Radiology: ECHOCARDIOGRAM COMPLETE  Result Date: 01/28/2022    ECHOCARDIOGRAM REPORT   Patient Name:   [facility]  Leonides Cave Date of Exam: 01/28/2022 Medical Rec #:  [id number]         Height:       62.0 in Accession #:    [id number]        Weight:       185.0 lb Date of Birth:  [birth year]         BSA:          1.849 m Patient Age:    59 years          BP:           108/63 mmHg Patient Gender: F                 HR:           94 bpm. Exam Location:  [facility] Procedure: 2D Echo, Cardiac Doppler and Color Doppler Indications:     CHF-acute diastolic N17.00  History:         Patient has prior history of Echocardiogram examinations, most                  recent 01/15/2019. Risk Factors:Hypertension.  Sonographer:     Sherrie Sport Referring Phys:  [id number] JAN A MANSY Diagnosing Phys: Donnelly Angelica  Sonographer Comments: Technically challenging study due to limited acoustic windows and suboptimal apical window. IMPRESSIONS  1. Left ventricular ejection fraction, by estimation, is 55 to 60%. The left ventricle has normal function. The left ventricle has no regional wall motion abnormalities. There is mild left ventricular hypertrophy. Left ventricular diastolic parameters are indeterminate.  2. Right ventricular systolic function is mildly reduced. The right ventricular size is not well visualized but probably at least moderately dilated.  3. Left atrial size was severely dilated.  4. Right atrial size was moderately dilated.  5. The mitral valve is normal in structure. No evidence of mitral valve regurgitation. No evidence of mitral stenosis. The mean mitral valve gradient is 2.0 mmHg.  6. The aortic valve was not well visualized. Aortic valve  regurgitation is not visualized. Aortic valve sclerosis is present, with no evidence of aortic valve stenosis.  7. The inferior vena cava is normal in size with greater than 50% respiratory variability, suggesting right atrial pressure of 3 mmHg. Conclusion(s)/Recommendation(s): Technically difficult study. RV looks at least moderately dilated with mildly decreased function. IVC is small and compressible indicating normal RA pressure. FINDINGS  Left Ventricle: Left ventricular ejection fraction, by estimation, is 55 to 60%. The left ventricle has normal function. The left ventricle has no regional wall motion abnormalities. The left ventricular internal cavity size was normal in size. There is  mild left ventricular hypertrophy. Left ventricular diastolic parameters are indeterminate. Right Ventricle: The right ventricular size is not well visualized but probably at least moderately dilated. Right vetricular wall thickness was not well visualized. Right ventricular systolic function is mildly reduced. Left Atrium: Left atrial size was severely dilated. Right Atrium: Right atrial size was moderately dilated. Pericardium: There is no evidence of pericardial effusion. Mitral Valve: The mitral valve is normal in structure. Mild mitral annular calcification. No evidence of mitral valve regurgitation. No evidence of mitral valve stenosis. MV peak gradient, 4.4 mmHg. The mean mitral valve gradient is 2.0 mmHg. Tricuspid Valve: The tricuspid valve is not well visualized. Tricuspid valve regurgitation is trivial. Aortic Valve: The aortic valve was not well visualized. Aortic valve regurgitation is not visualized. Aortic valve sclerosis is present, with no evidence of aortic valve stenosis. Aortic valve mean gradient  measures 3.3 mmHg. Aortic valve peak gradient measures 6.2 mmHg. Aortic valve area, by VTI measures 2.19 cm. Pulmonic Valve: The pulmonic valve was not well visualized. Aorta: The aortic root and ascending aorta  are structurally normal, with no evidence of dilitation. Venous: The inferior vena cava is normal in size with greater than 50% respiratory variability, suggesting right atrial pressure of 3 mmHg. IAS/Shunts: The interatrial septum was not well visualized.  LEFT VENTRICLE PLAX 2D LVIDd:         4.30 cm   Diastology LVIDs:         3.20 cm   LV e' medial:    9.14 cm/s LV PW:         1.40 cm   LV E/e' medial:  11.1 LV IVS:        1.20 cm   LV e' lateral:   10.60 cm/s LVOT diam:     2.00 cm   LV E/e' lateral: 9.5 LV SV:         45 LV SV Index:   24 LVOT Area:     3.14 cm  RIGHT VENTRICLE RV S prime:     12.60 cm/s TAPSE (M-mode): 1.4 cm LEFT ATRIUM              Index        RIGHT ATRIUM           Index LA diam:        4.90 cm  2.65 cm/m   RA Area:     24.20 cm LA Vol (A2C):   122.0 ml 65.97 ml/m  RA Volume:   82.90 ml  44.83 ml/m LA Vol (A4C):   109.0 ml 58.94 ml/m LA Biplane Vol: 123.0 ml 66.51 ml/m  AORTIC VALVE AV Area (Vmax):    2.02 cm AV Area (Vmean):   1.99 cm AV Area (VTI):     2.19 cm AV Vmax:           124.67 cm/s AV Vmean:          87.367 cm/s AV VTI:            0.207 m AV Peak Grad:      6.2 mmHg AV Mean Grad:      3.3 mmHg LVOT Vmax:         80.30 cm/s LVOT Vmean:        55.300 cm/s LVOT VTI:          0.144 m LVOT/AV VTI ratio: 0.70  AORTA Ao Root diam: 3.40 cm MITRAL VALVE                TRICUSPID VALVE MV Area (PHT): 5.16 cm     TR Peak grad:   27.2 mmHg MV Area VTI:   2.34 cm     TR Vmax:        261.00 cm/s MV Peak grad:  4.4 mmHg MV Mean grad:  2.0 mmHg     SHUNTS MV Vmax:       1.05 m/s     Systemic VTI:  0.14 m MV Vmean:      64.2 cm/s    Systemic Diam: 2.00 cm MV Decel Time: 147 msec MV E velocity: 101.00 cm/s Donnelly Angelica Electronically signed by Donnelly Angelica Signature Date/Time: 01/28/2022/1:07:36 PM    Final    DG Chest 2 View  Result Date: 01/27/2022 CLINICAL DATA:  Shortness of breath. EXAM: CHEST - 2 VIEW COMPARISON:  January 26, 2022 FINDINGS:  There is stable mild to moderate severity  enlargement of the cardiac silhouette. There is marked severity calcification of the aortic arch. Stable, diffusely increased interstitial lung markings are noted. Mild prominence of the pulmonary vasculature is seen. There is no evidence of focal consolidation, pleural effusion or pneumothorax. Multilevel degenerative changes are seen throughout the thoracic spine. IMPRESSION: 1. Stable cardiomegaly with mild pulmonary vascular congestion. Electronically Signed   By: Virgina Norfolk M.D.   On: 01/27/2022 19:23   DG Chest 2 View  Result Date: 01/26/2022 CLINICAL DATA:  Per Triage: Pt via POV from home. Pt c/o dry cough that started last weekend. States that last couple of days she has been coughing non-stop.Denies any chest pain. Denies any SOB. Denies fever. Pt is AANDOX4 and NAD EXAM: CHEST - 2 VIEW COMPARISON:  01/16/2019.  CT, 01/14/2019. FINDINGS: Stable enlargement of the cardiac silhouette. No mediastinal or hilar masses. No evidence of adenopathy. Prominent interstitial markings most evident in the bases, also stable. No evidence of pneumonia or pulmonary edema. No pleural effusion or pneumothorax. Skeletal structures are grossly intact. IMPRESSION: 1. No acute cardiopulmonary disease. Electronically Signed   By: Lajean Manes M.D.   On: 01/26/2022 14:14    EKG: Atrial fibrillation, HR 104. PVC.   ASSESSMENT AND PLAN:  Cathy Spears is an 86 year old female with history of paroxysmal A-fib and SVT, hypertension, mild aortic stenosis, breast cancer who presented to the hospital on 01/27/2022 with complaints of cough and inability to breathe.  Cardiology is consulted for volume overload.  # Cough # Shortness of breath # Acute on chronic HFpEF, mild AS The patient appears to be mildly volume overloaded possibly in the setting of dietary indiscretions from a wedding last week and the fact that she is currently in AF.  She additionally has a chronic cough that is worsened, and sounds to be like it  may be viral in nature and worsened by vocal cord spasms.  Could also have some contribution of the frequency of her cough from mild pulmonary edema from volume overload.  BNP mildly elevated at 160, decreased to 129 on day of presentation.  Echo showed preserved EF with possibly dilated right ventricle not well visualized.  RA pressure was 3. -Discontinue IV diuresis for which she has received 3 doses. Discharge on furosemide 40 mg PO daily - Discussed salt restriction and daily weights.  -Treatment of atrial fibrillation as below.  # Paroxysmal AF Has had intermittent recurrences of atrial fibrillation since 2020 after a orthopedic surgery.  Has been in atrial fibrillation since admission which is likely contributing to her heart failure exacerbation. AF is well controlled currently on telemetry with rates from 70-105. -Continue Eliquis 5 mg twice daily  - Continue metoprolol 50 mg BID - Continue diltiazem 120 mg  Patient appears safe for DC from cardiac perspective. She will need to call clinic tomorrow to schedule follow up with Dr. Saralyn Pilar in 1-2 weeks.    Signed: Andrez Grime MD 01/29/2022, 8:52 AM

## 2022-01-29 NOTE — Consult Note (Signed)
   Heart Failure Nurse Navigator Note  HFpEF 55 to 60%.  Mild LVH.  Indeterminate diastolic dysfunction.  Right ventricular systolic function is mildly reduced.  Severe left atrial enlargement.  Moderate right atrial enlargement.  She presented to the emergency room with complaints of dyspnea on exertion, cough, and spasm in her throat.  Chest x-ray revealed mild vascular congestion.  BNP 162.  Comorbidities:  Paroxysmal atrial fibrillation SVT Hypertension Mild aortic stenosis History of breast cancer  Medications:  Apixaban 5 mg 2 times a day Diltiazem 120 mg daily Metoprolol tartrate 50 mg 2 times a day Potassium chloride 20 mill equivalents daily Furosemide 40 mg daily  Labs:  Sodium 132, potassium 3.6, chloride 93, CO2 28, BUN 24, creatinine 0.69 GFR greater than 60, Weight is 83.3 kg Blood pressure 112/71   Initial meeting with patient and her daughter, Louanna Raw who was at the bedside.  Patient if I needed to involve interpreter, patient states that she understands a lot of English unless I would get to technical in my terms and her daughter reinforced that.  We discussed heart failure and what it means.  And how it is relates to the function of her heart.  She continues to use salt with cooking and on her food.  Explained the reasoning behind no salt and eating lower sodium foods.  Patient does not weigh herself daily, explained getting into the routine of daily weights and recording and reporting 3 pound weight gain overnight or 5 pounds within the week or changes in her symptoms.  Also discussed fluid restriction, patient states that she drinks a lot of fluids throughout the day, discussed the importance of sticking with no more than 64 ounces which includes all fluids that she would be drinking.  Discussed follow-up in the outpatient heart failure clinic for which she has an appointment on July 19 at 3 PM.  Daughter questions this as she states her mom already sees a  gastroenterologist, her PCP and her cardiologist, planed that Ms. Hackney specializes in heart failure.   She was given the living with heart failure teaching booklet, zone magnet, info on low-sodium and heart failure along with a weight chart.  They had no further questions.  Pricilla Riffle RN [facility]

## 2022-01-29 NOTE — Discharge Summary (Signed)
Physician Discharge Summary  PAT [name] GHW:[id number] DOB: September 27, 1935 DOA: 01/27/2022  PCP: Rusty Aus, MD  Admit date: 01/27/2022 Discharge date: 01/29/2022  Admitted From: home  Disposition:  home   Recommendations for Outpatient Follow-up:  Follow up with PCP in 1-2 weeks F/u w/ cardio, Dr. Saralyn Pilar, in 1-2 weeks   Home Health:  Equipment/Devices:  Discharge Condition: stable  CODE STATUS: full  Diet recommendation: Heart Healthy  Brief/Interim Summary: HPI was taken from Dr. Sidney Ace: Cathy Spears is a 86 y.o. female with medical history significant for GERD, atrial fibrillation, on Eliquis hypertension and breast cancer, presented to emergency room with a [place] of worsening cough with occasional inability to breathe as well as wheezing.  She admitted to orthopnea and worsening lower extremity edema for the last couple of days.  She admits to dyspnea on exertion.  No chest pain or palpitations.  No nausea or vomiting or abdominal pain.  No dysuria, oliguria or hematuria or flank pain.  While she was in the ER she was witnessed to have "spasms with significant anxiety and difficulty breathing however with normal pulse oximetry.  She has had similar episodes lately that would sometimes improve when she drinks water.  ED Course: Upon presentation to the emergency room, vital signs were within normal.  Labs revealed unremarkable BMP.  BNP was 129 yesterday 168 high-sensitivity troponin I was 9 and CBC showed mild leukopenia 3.9. EKG as reviewed by me : EKG showed atrial fibrillation with slightly rapid ventricular response of 104 and low voltage QRS Imaging: Two-view chest x-ray showed stable cardiomegaly with mild pulmonary vascular congestion.  The patient was given 40 mg of IV Lasix and 10 mg of IV Decadron.  She will be admitted to a progressive unit bed for further evaluation and management.  Discharge Diagnoses:  Principal Problem:   Acute CHF (congestive heart  failure) (HCC) Active Problems:   Paroxysmal atrial fibrillation with rapid ventricular response (HCC)   Laryngeal spasm   GERD without esophagitis   CHF (congestive heart failure) (HCC)  Acute CHF: unclear systolic vs diastolic vs combined. Echo showed  [id number]%, diastolic function was indeterminate, no regional wall motion abnormalities. Continue on lasix. Monitor I/Os. Cardio recs apprec    PAF: w/ RVR. Continue on cardizem, metoprolol, & started on eliquis. CHA2DS2-VASc score of 4. Cardio recs apprec    Possible laryngeal spasm: s/p IV steroids. Possibly secondary to GERD vs esophageal stenosis (hx of this & s/p esophageal dilatation a couple of years ago). S/p laryngoscope which was normal as per ENT. Speech evaluated pt    GERD: continue on PPI   Obesity: BMI 33.5. Complicates overall care & prognosis   Discharge Instructions  Discharge Instructions     Diet - low sodium heart healthy   Complete by: As directed    Discharge instructions   Complete by: As directed    F/u w/ cardio, Dr. Saralyn Pilar, in 1-2 weeks. F/u w/ PCP in 1-2 weeks   Increase activity slowly   Complete by: As directed       Allergies as of 01/29/2022       Reactions   Fish-derived Products Swelling   Blue Fish years ago.  Caused redness over all of face   Oxycodone Swelling        Medication List     TAKE these medications    apixaban 5 MG Tabs tablet Commonly known as: ELIQUIS Take 1 tablet (5 mg total) by mouth 2 (two) times daily.  What changed:  medication strength how much to take   cholecalciferol 1000 units tablet Commonly known as: VITAMIN D Take 1,000 Units by mouth daily.   diltiazem 120 MG 24 hr capsule Commonly known as: CARDIZEM CD Take 1 capsule (120 mg total) by mouth daily.   furosemide 40 MG tablet Commonly known as: LASIX Take 1 tablet (40 mg total) by mouth daily. Start taking on: January 30, 2022 What changed:  medication strength See the new instructions.    guaiFENesin 600 MG 12 hr tablet Commonly known as: Mucinex Take 1 tablet (600 mg total) by mouth 2 (two) times daily as needed for up to 14 days for to loosen phlegm.   metoprolol tartrate 50 MG tablet Commonly known as: LOPRESSOR Take 50 mg by mouth 2 (two) times daily.   omeprazole 40 MG capsule Commonly known as: PRILOSEC Take 40 mg by mouth 2 (two) times a day.   potassium chloride SA 20 MEQ tablet Commonly known as: KLOR-CON M Take one tablet (20 mEq) with your FUROSEMIDE dose on days when you are taking it.   PRESERVISION AREDS PO Take 1 tablet by mouth 2 (two) times daily.   TH Calcium Carbonate-Vitamin D 600-400 MG-UNIT tablet Generic drug: Calcium Carbonate-Vitamin D Take 1 tablet by mouth 2 (two) times daily.   vitamin B-12 1000 MCG tablet Commonly known as: CYANOCOBALAMIN Take 1,000 mcg by mouth daily.        Allergies  Allergen Reactions   Fish-Derived Products Swelling    Blue Fish years ago.  Caused redness over all of face   Oxycodone Swelling    Consultations: Cardio ENT    Procedures/Studies: ECHOCARDIOGRAM COMPLETE  Result Date: 01/28/2022    ECHOCARDIOGRAM REPORT   Patient Name:   Cathy Spears Date of Exam: 01/28/2022 Medical Rec #:  [id number]         Height:       62.0 in Accession #:    [id number]        Weight:       185.0 lb Date of Birth:  12-Aug-1935         BSA:          1.849 m Patient Age:    86 years          BP:           108/63 mmHg Patient Gender: F                 HR:           94 bpm. Exam Location:  [facility] Procedure: 2D Echo, Cardiac Doppler and Color Doppler Indications:     CHF-acute diastolic F64.33  History:         Patient has prior history of Echocardiogram examinations, most                  recent 01/15/2019. Risk Factors:Hypertension.  Sonographer:     Sherrie Sport Referring Phys:  [id number] JAN A MANSY Diagnosing Phys: Donnelly Angelica  Sonographer Comments: Technically challenging study due to limited acoustic windows and suboptimal  apical window. IMPRESSIONS  1. Left ventricular ejection fraction, by estimation, is 55 to 60%. The left ventricle has normal function. The left ventricle has no regional wall motion abnormalities. There is mild left ventricular hypertrophy. Left ventricular diastolic parameters are indeterminate.  2. Right ventricular systolic function is mildly reduced. The right ventricular size is not well visualized but probably at least moderately dilated.  3. Left atrial  size was severely dilated.  4. Right atrial size was moderately dilated.  5. The mitral valve is normal in structure. No evidence of mitral valve regurgitation. No evidence of mitral stenosis. The mean mitral valve gradient is 2.0 mmHg.  6. The aortic valve was not well visualized. Aortic valve regurgitation is not visualized. Aortic valve sclerosis is present, with no evidence of aortic valve stenosis.  7. The inferior vena cava is normal in size with greater than 50% respiratory variability, suggesting right atrial pressure of 3 mmHg. Conclusion(s)/Recommendation(s): Technically difficult study. RV looks at least moderately dilated with mildly decreased function. IVC is small and compressible indicating normal RA pressure. FINDINGS  Left Ventricle: Left ventricular ejection fraction, by estimation, is 55 to 60%. The left ventricle has normal function. The left ventricle has no regional wall motion abnormalities. The left ventricular internal cavity size was normal in size. There is  mild left ventricular hypertrophy. Left ventricular diastolic parameters are indeterminate. Right Ventricle: The right ventricular size is not well visualized but probably at least moderately dilated. Right vetricular wall thickness was not well visualized. Right ventricular systolic function is mildly reduced. Left Atrium: Left atrial size was severely dilated. Right Atrium: Right atrial size was moderately dilated. Pericardium: There is no evidence of pericardial effusion.  Mitral Valve: The mitral valve is normal in structure. Mild mitral annular calcification. No evidence of mitral valve regurgitation. No evidence of mitral valve stenosis. MV peak gradient, 4.4 mmHg. The mean mitral valve gradient is 2.0 mmHg. Tricuspid Valve: The tricuspid valve is not well visualized. Tricuspid valve regurgitation is trivial. Aortic Valve: The aortic valve was not well visualized. Aortic valve regurgitation is not visualized. Aortic valve sclerosis is present, with no evidence of aortic valve stenosis. Aortic valve mean gradient measures 3.3 mmHg. Aortic valve peak gradient measures 6.2 mmHg. Aortic valve area, by VTI measures 2.19 cm. Pulmonic Valve: The pulmonic valve was not well visualized. Aorta: The aortic root and ascending aorta are structurally normal, with no evidence of dilitation. Venous: The inferior vena cava is normal in size with greater than 50% respiratory variability, suggesting right atrial pressure of 3 mmHg. IAS/Shunts: The interatrial septum was not well visualized.  LEFT VENTRICLE PLAX 2D LVIDd:         4.30 cm   Diastology LVIDs:         3.20 cm   LV e' medial:    9.14 cm/s LV PW:         1.40 cm   LV E/e' medial:  11.1 LV IVS:        1.20 cm   LV e' lateral:   10.60 cm/s LVOT diam:     2.00 cm   LV E/e' lateral: 9.5 LV SV:         45 LV SV Index:   24 LVOT Area:     3.14 cm  RIGHT VENTRICLE RV S prime:     12.60 cm/s TAPSE (M-mode): 1.4 cm LEFT ATRIUM              Index        RIGHT ATRIUM           Index LA diam:        4.90 cm  2.65 cm/m   RA Area:     24.20 cm LA Vol (A2C):   122.0 ml 65.97 ml/m  RA Volume:   82.90 ml  44.83 ml/m LA Vol (A4C):   109.0 ml 58.94 ml/m LA Biplane  Vol: 123.0 ml 66.51 ml/m  AORTIC VALVE AV Area (Vmax):    2.02 cm AV Area (Vmean):   1.99 cm AV Area (VTI):     2.19 cm AV Vmax:           124.67 cm/s AV Vmean:          87.367 cm/s AV VTI:            0.207 m AV Peak Grad:      6.2 mmHg AV Mean Grad:      3.3 mmHg LVOT Vmax:          80.30 cm/s LVOT Vmean:        55.300 cm/s LVOT VTI:          0.144 m LVOT/AV VTI ratio: 0.70  AORTA Ao Root diam: 3.40 cm MITRAL VALVE                TRICUSPID VALVE MV Area (PHT): 5.16 cm     TR Peak grad:   27.2 mmHg MV Area VTI:   2.34 cm     TR Vmax:        261.00 cm/s MV Peak grad:  4.4 mmHg MV Mean grad:  2.0 mmHg     SHUNTS MV Vmax:       1.05 m/s     Systemic VTI:  0.14 m MV Vmean:      64.2 cm/s    Systemic Diam: 2.00 cm MV Decel Time: 147 msec MV E velocity: 101.00 cm/s Donnelly Angelica Electronically signed by Donnelly Angelica Signature Date/Time: 01/28/2022/1:07:36 PM    Final    DG Chest 2 View  Result Date: 01/27/2022 CLINICAL DATA:  Shortness of breath. EXAM: CHEST - 2 VIEW COMPARISON:  January 26, 2022 FINDINGS: There is stable mild to moderate severity enlargement of the cardiac silhouette. There is marked severity calcification of the aortic arch. Stable, diffusely increased interstitial lung markings are noted. Mild prominence of the pulmonary vasculature is seen. There is no evidence of focal consolidation, pleural effusion or pneumothorax. Multilevel degenerative changes are seen throughout the thoracic spine. IMPRESSION: 1. Stable cardiomegaly with mild pulmonary vascular congestion. Electronically Signed   By: Virgina Norfolk M.D.   On: 01/27/2022 19:23   DG Chest 2 View  Result Date: 01/26/2022 CLINICAL DATA:  Per Triage: Pt via POV from home. Pt c/o dry cough that started last weekend. States that last couple of days she has been coughing non-stop.Denies any chest pain. Denies any SOB. Denies fever. Pt is AANDOX4 and NAD EXAM: CHEST - 2 VIEW COMPARISON:  01/16/2019.  CT, 01/14/2019. FINDINGS: Stable enlargement of the cardiac silhouette. No mediastinal or hilar masses. No evidence of adenopathy. Prominent interstitial markings most evident in the bases, also stable. No evidence of pneumonia or pulmonary edema. No pleural effusion or pneumothorax. Skeletal structures are grossly intact. IMPRESSION:  1. No acute cardiopulmonary disease. Electronically Signed   By: Lajean Manes M.D.   On: 01/26/2022 14:14   (Echo, Carotid, EGD, Colonoscopy, ERCP)    Subjective: Pt denies any complaints    Discharge Exam: Vitals:   01/29/22 0354 01/29/22 0725  BP: 114/73 (!) 125/49  Pulse: 97 100  Resp: 17 18  Temp: 98 F (36.7 C) 98.4 F (36.9 C)  SpO2: 96% 93%   Vitals:   01/28/22 2006 01/29/22 0008 01/29/22 0354 01/29/22 0725  BP: 125/87 (!) 121/52 114/73 (!) 125/49  Pulse: 99 95 97 100  Resp: '17 17 17 18  '$ Temp:  98 F (36.7 C) (!) 97.4 F (36.3 C) 98 F (36.7 C) 98.4 F (36.9 C)  TempSrc:  Oral Oral Oral  SpO2: 97% 96% 96% 93%  Weight:   83.3 kg   Height:        General: Pt is alert, awake, not in acute distress Cardiovascular: S1/S2 +, no rubs, no gallops Respiratory: CTA bilaterally, no wheezing, no rhonchi Abdominal: Soft, NT, obese, bowel sounds + Extremities: no cyanosis    The results of significant diagnostics from this hospitalization (including imaging, microbiology, ancillary and laboratory) are listed below for reference.     Microbiology: Recent Results (from the past 240 hour(s))  SARS Coronavirus 2 by RT PCR (hospital order, performed in [facility] hospital lab) *cepheid single result test* Anterior Nasal Swab     Status: None   Collection Time: 01/28/22  6:28 AM   Specimen: Anterior Nasal Swab  Result Value Ref Range Status   SARS Coronavirus 2 by RT PCR NEGATIVE NEGATIVE Final    Comment: (NOTE) SARS-CoV-2 target nucleic acids are NOT DETECTED.  The SARS-CoV-2 RNA is generally detectable in upper and lower respiratory specimens during the acute phase of infection. The lowest concentration of SARS-CoV-2 viral copies this assay can detect is 250 copies / mL. A negative result does not preclude SARS-CoV-2 infection and should not be used as the sole basis for treatment or other patient management decisions.  A negative result may occur with improper  specimen collection / handling, submission of specimen other than nasopharyngeal swab, presence of viral mutation(s) within the areas targeted by this assay, and inadequate number of viral copies (<250 copies / mL). A negative result must be combined with clinical observations, patient history, and epidemiological information.  Fact Sheet for Patients:   https://www.patel.info/  Fact Sheet for Healthcare Providers: https://hall.com/  This test is not yet approved or  cleared by the [place] FDA and has been authorized for detection and/or diagnosis of SARS-CoV-2 by FDA under an Emergency Use Authorization (EUA).  This EUA will remain in effect (meaning this test can be used) for the duration of the COVID-19 declaration under Section 564(b)(1) of the Act, 21 U.S.C. section 360bbb-3(b)(1), unless the authorization is terminated or revoked sooner.  Performed at [facility], [address] [place], [place] [address]      Labs: BNP (last 3 results) Recent Labs    01/26/22 1451 01/27/22 1849  BNP 168.5* 179.1*   Basic Metabolic Panel: Recent Labs  Lab 01/26/22 1451 01/27/22 1834 01/28/22 0628 01/29/22 0549  NA 137 139 136 132*  K 3.8 4.0 4.2 3.6  CL 104 101 99 93*  CO2 '24 28 29 28  '$ GLUCOSE 106* 111* 164* 122*  BUN '18 18 18 '$ 24*  CREATININE 0.77 0.77 0.69 0.69  CALCIUM 9.1 9.4 9.4 9.4   Liver Function Tests: Recent Labs  Lab 01/26/22 1451  AST 23  ALT 16  ALKPHOS 67  BILITOT 0.6  PROT 6.9  ALBUMIN 3.9   No results for input(s): "LIPASE", "AMYLASE" in the last 168 hours. No results for input(s): "AMMONIA" in the last 168 hours. CBC: Recent Labs  Lab 01/26/22 1451 01/27/22 1834 01/28/22 0628 01/29/22 0549  WBC 4.1 3.9* 3.4* 7.7  NEUTROABS 2.8 2.3  --   --   HGB 11.8* 12.3 12.9 13.7  HCT 37.5 39.7 40.8 41.5  MCV 83.0 83.4 83.1 79.7*  PLT 204 218 222 262   Cardiac Enzymes: No results for  input(s): "CKTOTAL", "CKMB", "  CKMBINDEX", "TROPONINI" in the last 168 hours. BNP: Invalid input(s): "POCBNP" CBG: No results for input(s): "GLUCAP" in the last 168 hours. D-Dimer No results for input(s): "DDIMER" in the last 72 hours. Hgb A1c No results for input(s): "HGBA1C" in the last 72 hours. Lipid Profile No results for input(s): "CHOL", "HDL", "LDLCALC", "TRIG", "CHOLHDL", "LDLDIRECT" in the last 72 hours. Thyroid function studies No results for input(s): "TSH", "T4TOTAL", "T3FREE", "THYROIDAB" in the last 72 hours.  Invalid input(s): "FREET3" Anemia work up No results for input(s): "VITAMINB12", "FOLATE", "FERRITIN", "TIBC", "IRON", "RETICCTPCT" in the last 72 hours. Urinalysis    Component Value Date/Time   COLORURINE STRAW (A) 01/16/2019 1024   APPEARANCEUR CLEAR (A) 01/16/2019 1024   LABSPEC 1.005 01/16/2019 1024   PHURINE 7.0 01/16/2019 1024   GLUCOSEU NEGATIVE 01/16/2019 1024   HGBUR NEGATIVE 01/16/2019 1024   BILIRUBINUR NEGATIVE 01/16/2019 1024   KETONESUR 5 (A) 01/16/2019 1024   PROTEINUR NEGATIVE 01/16/2019 1024   NITRITE NEGATIVE 01/16/2019 1024   LEUKOCYTESUR NEGATIVE 01/16/2019 1024   Sepsis Labs Recent Labs  Lab 01/26/22 1451 01/27/22 1834 01/28/22 0628 01/29/22 0549  WBC 4.1 3.9* 3.4* 7.7   Microbiology Recent Results (from the past 240 hour(s))  SARS Coronavirus 2 by RT PCR (hospital order, performed in [facility] hospital lab) *cepheid single result test* Anterior Nasal Swab     Status: None   Collection Time: 01/28/22  6:28 AM   Specimen: Anterior Nasal Swab  Result Value Ref Range Status   SARS Coronavirus 2 by RT PCR NEGATIVE NEGATIVE Final    Comment: (NOTE) SARS-CoV-2 target nucleic acids are NOT DETECTED.  The SARS-CoV-2 RNA is generally detectable in upper and lower respiratory specimens during the acute phase of infection. The lowest concentration of SARS-CoV-2 viral copies this assay can detect is 250 copies / mL. A negative  result does not preclude SARS-CoV-2 infection and should not be used as the sole basis for treatment or other patient management decisions.  A negative result may occur with improper specimen collection / handling, submission of specimen other than nasopharyngeal swab, presence of viral mutation(s) within the areas targeted by this assay, and inadequate number of viral copies (<250 copies / mL). A negative result must be combined with clinical observations, patient history, and epidemiological information.  Fact Sheet for Patients:   https://www.patel.info/  Fact Sheet for Healthcare Providers: https://hall.com/  This test is not yet approved or  cleared by the [place] FDA and has been authorized for detection and/or diagnosis of SARS-CoV-2 by FDA under an Emergency Use Authorization (EUA).  This EUA will remain in effect (meaning this test can be used) for the duration of the COVID-19 declaration under Section 564(b)(1) of the Act, 21 U.S.C. section 360bbb-3(b)(1), unless the authorization is terminated or revoked sooner.  Performed at [facility], [address], [place], [place] [address]      Time coordinating discharge: Over 30 minutes  SIGNED:   [name], MD  [facility] 01/29/2022, 12:20 PM Pager   If 7PM-7AM, please contact night-coverage [email or web address]

## 2022-01-31 ENCOUNTER — Ambulatory Visit: Payer: Medicare HMO | Admitting: Gastroenterology

## 2022-01-31 ENCOUNTER — Other Ambulatory Visit: Payer: Self-pay | Admitting: Internal Medicine

## 2022-01-31 DIAGNOSIS — R1312 Dysphagia, oropharyngeal phase: Secondary | ICD-10-CM

## 2022-01-31 DIAGNOSIS — K219 Gastro-esophageal reflux disease without esophagitis: Secondary | ICD-10-CM

## 2022-02-01 ENCOUNTER — Emergency Department: Payer: Medicare HMO

## 2022-02-01 ENCOUNTER — Encounter: Payer: Self-pay | Admitting: Emergency Medicine

## 2022-02-01 DIAGNOSIS — Z96651 Presence of right artificial knee joint: Secondary | ICD-10-CM | POA: Diagnosis present

## 2022-02-01 DIAGNOSIS — E785 Hyperlipidemia, unspecified: Secondary | ICD-10-CM | POA: Diagnosis present

## 2022-02-01 DIAGNOSIS — E871 Hypo-osmolality and hyponatremia: Secondary | ICD-10-CM | POA: Diagnosis present

## 2022-02-01 DIAGNOSIS — I5032 Chronic diastolic (congestive) heart failure: Secondary | ICD-10-CM | POA: Diagnosis present

## 2022-02-01 DIAGNOSIS — B348 Other viral infections of unspecified site: Secondary | ICD-10-CM | POA: Diagnosis present

## 2022-02-01 DIAGNOSIS — Z7901 Long term (current) use of anticoagulants: Secondary | ICD-10-CM

## 2022-02-01 DIAGNOSIS — J441 Chronic obstructive pulmonary disease with (acute) exacerbation: Secondary | ICD-10-CM | POA: Diagnosis not present

## 2022-02-01 DIAGNOSIS — Z923 Personal history of irradiation: Secondary | ICD-10-CM

## 2022-02-01 DIAGNOSIS — H269 Unspecified cataract: Secondary | ICD-10-CM | POA: Diagnosis present

## 2022-02-01 DIAGNOSIS — R0902 Hypoxemia: Secondary | ICD-10-CM | POA: Diagnosis present

## 2022-02-01 DIAGNOSIS — I480 Paroxysmal atrial fibrillation: Secondary | ICD-10-CM | POA: Diagnosis present

## 2022-02-01 DIAGNOSIS — I110 Hypertensive heart disease with heart failure: Secondary | ICD-10-CM | POA: Diagnosis present

## 2022-02-01 DIAGNOSIS — R0602 Shortness of breath: Secondary | ICD-10-CM | POA: Diagnosis not present

## 2022-02-01 DIAGNOSIS — Z91013 Allergy to seafood: Secondary | ICD-10-CM

## 2022-02-01 DIAGNOSIS — R053 Chronic cough: Secondary | ICD-10-CM | POA: Diagnosis present

## 2022-02-01 DIAGNOSIS — F419 Anxiety disorder, unspecified: Secondary | ICD-10-CM | POA: Diagnosis present

## 2022-02-01 DIAGNOSIS — I272 Pulmonary hypertension, unspecified: Secondary | ICD-10-CM | POA: Diagnosis present

## 2022-02-01 DIAGNOSIS — K219 Gastro-esophageal reflux disease without esophagitis: Secondary | ICD-10-CM | POA: Diagnosis present

## 2022-02-01 DIAGNOSIS — R000 Tachycardia, unspecified: Secondary | ICD-10-CM | POA: Diagnosis present

## 2022-02-01 DIAGNOSIS — Z853 Personal history of malignant neoplasm of breast: Secondary | ICD-10-CM

## 2022-02-01 DIAGNOSIS — E669 Obesity, unspecified: Secondary | ICD-10-CM | POA: Diagnosis present

## 2022-02-01 DIAGNOSIS — Z79899 Other long term (current) drug therapy: Secondary | ICD-10-CM

## 2022-02-01 DIAGNOSIS — J9811 Atelectasis: Secondary | ICD-10-CM | POA: Diagnosis present

## 2022-02-01 DIAGNOSIS — Z885 Allergy status to narcotic agent status: Secondary | ICD-10-CM

## 2022-02-01 DIAGNOSIS — Z6833 Body mass index (BMI) 33.0-33.9, adult: Secondary | ICD-10-CM

## 2022-02-01 LAB — CBC
HCT: 39.1 % (ref 36.0–46.0)
Hemoglobin: 12.7 g/dL (ref 12.0–15.0)
MCH: 25.9 pg — ABNORMAL LOW (ref 26.0–34.0)
MCHC: 32.5 g/dL (ref 30.0–36.0)
MCV: 79.8 fL — ABNORMAL LOW (ref 80.0–100.0)
Platelets: 270 10*3/uL (ref 150–400)
RBC: 4.9 MIL/uL (ref 3.87–5.11)
RDW: 15.2 % (ref 11.5–15.5)
WBC: 7.3 10*3/uL (ref 4.0–10.5)
nRBC: 0 % (ref 0.0–0.2)

## 2022-02-01 LAB — BASIC METABOLIC PANEL
Anion gap: 10 (ref 5–15)
BUN: 25 mg/dL — ABNORMAL HIGH (ref 8–23)
CO2: 27 mmol/L (ref 22–32)
Calcium: 8.8 mg/dL — ABNORMAL LOW (ref 8.9–10.3)
Chloride: 95 mmol/L — ABNORMAL LOW (ref 98–111)
Creatinine, Ser: 0.78 mg/dL (ref 0.44–1.00)
GFR, Estimated: >60 mL/min (ref >60)
Glucose, Bld: 107 mg/dL — ABNORMAL HIGH (ref 70–99)
Potassium: 3.8 mmol/L (ref 3.5–5.1)
Sodium: 132 mmol/L — ABNORMAL LOW (ref 135–145)

## 2022-02-01 LAB — TROPONIN I (HIGH SENSITIVITY): Troponin I (High Sensitivity): 16 ng/L (ref <18)

## 2022-02-01 LAB — BRAIN NATRIURETIC PEPTIDE: B Natriuretic Peptide: 127.4 pg/mL — ABNORMAL HIGH (ref 0.0–100.0)

## 2022-02-01 NOTE — ED Triage Notes (Signed)
Pt presents via EMS with complaints of SOB that started yesterday. Recently dx with CHF and was admitted this week and had improvement in her sx until tonight. She notes having a dry cough with associated SOB. Pt endorses compliance with her medications. Denies CP.

## 2022-02-01 NOTE — ED Provider Triage Note (Signed)
Emergency Medicine Provider Triage Evaluation Note  Cathy Spears, a 86 y.o. female  was evaluated in triage.  Pt complains of shortness of breath that started yesterday.  Patient was recent diagnosed with CHF, was admitted this week for hospital and had improvement of her symptoms until tonight.  She reports a dry nonproductive cough.  She has been taking her medications as prescribed.  History of CHF, A-fib on Plavix, and hypertension.  Review of Systems  Positive: SOB Negative: FCS  Physical Exam  BP 124/69   Pulse 74   Temp 98.1 F (36.7 C) (Oral)   Resp 20   Ht '5\' 2"'$  (1.575 m)   Wt 83.4 kg   SpO2 96%   BMI 33.63 kg/m  Gen:   Awake, no distress  NAD Resp:  Normal effort mild wheeze. Intermittent cough MSK:   Moves extremities without difficulty  Other:    Medical Decision Making  Medically screening exam initiated at 8:18 PM.  Appropriate orders placed.  Cathy Spears was informed that the remainder of the evaluation will be completed by another provider, this initial triage assessment does not replace that evaluation, and the importance of remaining in the ED until their evaluation is complete.  Geriatric patient to the ED for evaluation of shortness of breath and nonproductive cough.  Patient with recent diagnosis of CHF on Lasix and A-fib on Plavix.   Melvenia Needles, PA-C 02/01/22 2020

## 2022-02-02 ENCOUNTER — Observation Stay: Payer: Medicare HMO

## 2022-02-02 ENCOUNTER — Inpatient Hospital Stay
Admission: EM | Admit: 2022-02-02 | Discharge: 2022-02-08 | DRG: 191 | Disposition: A | Discharge status: Home / Self Care | Payer: Medicare HMO | Attending: Internal Medicine | Admitting: Internal Medicine

## 2022-02-02 ENCOUNTER — Encounter: Payer: Self-pay | Admitting: Internal Medicine

## 2022-02-02 DIAGNOSIS — I5032 Chronic diastolic (congestive) heart failure: Secondary | ICD-10-CM

## 2022-02-02 DIAGNOSIS — I480 Paroxysmal atrial fibrillation: Secondary | ICD-10-CM | POA: Diagnosis not present

## 2022-02-02 DIAGNOSIS — E669 Obesity, unspecified: Secondary | ICD-10-CM

## 2022-02-02 DIAGNOSIS — B348 Other viral infections of unspecified site: Secondary | ICD-10-CM

## 2022-02-02 DIAGNOSIS — J441 Chronic obstructive pulmonary disease with (acute) exacerbation: Secondary | ICD-10-CM

## 2022-02-02 DIAGNOSIS — I4891 Unspecified atrial fibrillation: Principal | ICD-10-CM

## 2022-02-02 DIAGNOSIS — R0602 Shortness of breath: Secondary | ICD-10-CM

## 2022-02-02 DIAGNOSIS — E871 Hypo-osmolality and hyponatremia: Secondary | ICD-10-CM

## 2022-02-02 DIAGNOSIS — F419 Anxiety disorder, unspecified: Secondary | ICD-10-CM

## 2022-02-02 LAB — TROPONIN I (HIGH SENSITIVITY): Troponin I (High Sensitivity): 15 ng/L (ref <18)

## 2022-02-02 MED ORDER — LORAZEPAM 2 MG/ML IJ SOLN
0.5000 mg | Freq: Once | INTRAMUSCULAR | Status: AC
  Administered 2022-02-02: 0.5 mg via INTRAVENOUS
  Filled 2022-02-02: qty 1

## 2022-02-02 MED ORDER — IPRATROPIUM-ALBUTEROL 0.5-2.5 (3) MG/3ML IN SOLN
3.0000 mL | Freq: Four times a day (QID) | RESPIRATORY_TRACT | Status: DC
Start: 2022-02-02 — End: 2022-02-03
  Filled 2022-02-02 (×4): qty 3

## 2022-02-02 MED ORDER — METOPROLOL TARTRATE 50 MG PO TABS
50.0000 mg | ORAL_TABLET | Freq: Two times a day (BID) | ORAL | Status: DC
  Administered 2022-02-02 – 2022-02-08 (×13): 50 mg via ORAL
  Filled 2022-02-02 (×6): qty 1
  Filled 2022-02-02 (×2): qty 2
  Filled 2022-02-02 (×6): qty 1

## 2022-02-02 MED ORDER — PREDNISONE 20 MG PO TABS: 40.0000 mg | ORAL_TABLET | Freq: Every day | ORAL | Status: DC

## 2022-02-02 MED ORDER — METHYLPREDNISOLONE SODIUM SUCC 40 MG IJ SOLR
40.0000 mg | Freq: Two times a day (BID) | INTRAMUSCULAR | Status: DC
  Filled 2022-02-02: qty 1

## 2022-02-02 MED ORDER — DILTIAZEM HCL 25 MG/5ML IV SOLN
10.0000 mg | Freq: Once | INTRAVENOUS | Status: AC
  Administered 2022-02-02: 10 mg via INTRAVENOUS
  Filled 2022-02-02: qty 5

## 2022-02-02 MED ORDER — APIXABAN 5 MG PO TABS
5.0000 mg | ORAL_TABLET | Freq: Two times a day (BID) | ORAL | Status: DC
  Administered 2022-02-02 – 2022-02-08 (×13): 5 mg via ORAL
  Filled 2022-02-02 (×13): qty 1

## 2022-02-02 MED ORDER — ALBUTEROL SULFATE (2.5 MG/3ML) 0.083% IN NEBU: 2.5000 mg | INHALATION_SOLUTION | RESPIRATORY_TRACT | Status: DC | PRN

## 2022-02-02 MED ORDER — GUAIFENESIN ER 600 MG PO TB12
600.0000 mg | ORAL_TABLET | Freq: Two times a day (BID) | ORAL | Status: DC | PRN
  Administered 2022-02-03: 600 mg via ORAL
  Filled 2022-02-02: qty 1

## 2022-02-02 MED ORDER — ACETAMINOPHEN 325 MG PO TABS: 650.0000 mg | ORAL_TABLET | ORAL | Status: DC | PRN

## 2022-02-02 MED ORDER — ONDANSETRON HCL 4 MG/2ML IJ SOLN: 4.0000 mg | Freq: Four times a day (QID) | INTRAMUSCULAR | Status: DC | PRN

## 2022-02-02 MED ORDER — ALPRAZOLAM 0.25 MG PO TABS
0.2500 mg | ORAL_TABLET | Freq: Two times a day (BID) | ORAL | Status: DC | PRN
  Administered 2022-02-03: 0.25 mg via ORAL
  Filled 2022-02-02 (×2): qty 1

## 2022-02-02 MED ORDER — DILTIAZEM HCL-DEXTROSE 125-5 MG/125ML-% IV SOLN (PREMIX)
5.0000 mg/h | INTRAVENOUS | Status: DC
  Administered 2022-02-02: 5 mg/h via INTRAVENOUS
  Filled 2022-02-02: qty 125

## 2022-02-02 MED ORDER — FUROSEMIDE 40 MG PO TABS
40.0000 mg | ORAL_TABLET | Freq: Every day | ORAL | Status: DC
  Administered 2022-02-02 – 2022-02-08 (×7): 40 mg via ORAL
  Filled 2022-02-02 (×8): qty 1

## 2022-02-02 MED ORDER — IOHEXOL 350 MG/ML SOLN
75.0000 mL | Freq: Once | INTRAVENOUS | Status: AC | PRN
  Administered 2022-02-02: 75 mL via INTRAVENOUS

## 2022-02-02 MED ORDER — IPRATROPIUM-ALBUTEROL 0.5-2.5 (3) MG/3ML IN SOLN
3.0000 mL | Freq: Once | RESPIRATORY_TRACT | Status: AC
  Administered 2022-02-02: 3 mL via RESPIRATORY_TRACT
  Filled 2022-02-02: qty 3

## 2022-02-02 MED ORDER — PANTOPRAZOLE SODIUM 40 MG PO TBEC
40.0000 mg | DELAYED_RELEASE_TABLET | Freq: Every day | ORAL | Status: DC
  Administered 2022-02-04 – 2022-02-07 (×4): 40 mg via ORAL
  Filled 2022-02-02 (×7): qty 1

## 2022-02-02 MED ORDER — DOXYCYCLINE HYCLATE 100 MG PO TABS
100.0000 mg | ORAL_TABLET | Freq: Once | ORAL | Status: AC
  Administered 2022-02-02: 100 mg via ORAL
  Filled 2022-02-02: qty 1

## 2022-02-02 MED ORDER — METHYLPREDNISOLONE SODIUM SUCC 40 MG IJ SOLR: 40.0000 mg | Freq: Two times a day (BID) | INTRAMUSCULAR | Status: DC

## 2022-02-02 MED ORDER — POTASSIUM CHLORIDE CRYS ER 20 MEQ PO TBCR
20.0000 meq | EXTENDED_RELEASE_TABLET | Freq: Every day | ORAL | Status: DC
  Administered 2022-02-03 – 2022-02-08 (×6): 20 meq via ORAL
  Filled 2022-02-02 (×7): qty 1

## 2022-02-02 MED ORDER — METHYLPREDNISOLONE SODIUM SUCC 125 MG IJ SOLR
125.0000 mg | Freq: Once | INTRAMUSCULAR | Status: AC
  Administered 2022-02-02: 125 mg via INTRAVENOUS
  Filled 2022-02-02: qty 2

## 2022-02-02 MED ORDER — DILTIAZEM HCL 60 MG PO TABS: 60.0000 mg | ORAL_TABLET | Freq: Once | ORAL | Status: DC

## 2022-02-02 MED ORDER — OCUVITE-LUTEIN PO CAPS
1.0000 | ORAL_CAPSULE | Freq: Two times a day (BID) | ORAL | Status: DC
  Administered 2022-02-02 – 2022-02-08 (×10): 1 via ORAL
  Filled 2022-02-02 (×14): qty 1

## 2022-02-02 MED ORDER — DILTIAZEM HCL ER COATED BEADS 120 MG PO CP24
120.0000 mg | ORAL_CAPSULE | Freq: Every day | ORAL | Status: DC
  Administered 2022-02-02 – 2022-02-08 (×7): 120 mg via ORAL
  Filled 2022-02-02 (×7): qty 1

## 2022-02-02 NOTE — Progress Notes (Signed)
  Progress Note   Patient: Cathy Spears OJJ:[id number] DOB: Dec 12, 1935 DOA: 02/02/2022     0 DOS: the patient was seen and examined on 02/02/2022   Brief hospital course: TYLEE YUM is a 87 y.o. female with medical history significant for A-fib on Eliquis, HTN, breast cancer, GERD, recent hospitalization from 7/2 to 7/4 for CHF and possible laryngeal spasm with normal flex laryngoscopy, who returns to the ED with a complaint of shortness of breath that started the day prior associated with cough productive of white phlegm.  Upon arriving the hospital, patient was found to have paroxysmal atrial fibrillation with rapid ventricular response, started on diltiazem drip, continued on apixaban. Patient was also placed on steroids for COPD exacerbation.  Assessment and Plan: Paroxysmal atrial fibrillation with rapid ventricular response. Heart rate seem to be better today, will switch over diltiazem to oral continue beta-blocker. Continue apixaban.  COPD exacerbation. Pulm hypertension. Patient had a sudden onset of shortness of breath when the tachycardia, I obtain a CT angiogram, ruled out PE. CT scan also showed evidence of pulmonary hypertension, however, echocardiogram had a normal ejection fraction did not mention pulmonary arterial pressure. Continue IV steroids followed by oral.  Chronic diastolic congestive heart failure. Currently does not show evidence of volume overload.  Continue home dose metoprolol and furosemide.      Subjective:  Patient still has a nonproductive cough, short of breath is improving  Physical Exam: Vitals:   02/02/22 1055 02/02/22 1100 02/02/22 1208 02/02/22 1334  BP:  (!) 103/53 116/67   Pulse: 94 93 89   Resp: 20 (!) 26 20   Temp:   (!) 97.5 F (36.4 C)   TempSrc:   Oral   SpO2: 96% 96% 98% 98%  Weight:      Height:       General exam: Appears calm and comfortable  Respiratory system: Decreased breathing sounds without crackles.  Respiratory effort normal. Cardiovascular system: Irregular. No JVD, murmurs, rubs, gallops or clicks. No pedal edema. Gastrointestinal system: Abdomen is nondistended, soft and nontender. No organomegaly or masses felt. Normal bowel sounds heard. Central nervous system: Alert and oriented. No focal neurological deficits. Extremities: Symmetric 5 x 5 power. Skin: No rashes, lesions or ulcers Psychiatry: Judgement and insight appear normal. Mood & affect appropriate.   Data Reviewed:  Reviewed echocardiogram, chest x-ray, lab results  Family Communication: Daughter at the bedside.  Disposition: Status is: Observation   Planned Discharge Destination: Home with Home Health    Time spent: 35 minutes  Author: Sharen Hones, MD 02/02/2022 1:36 PM  For on call review [email or web address].

## 2022-02-02 NOTE — ED Notes (Signed)
Received report from [facility].

## 2022-02-02 NOTE — Evaluation (Signed)
Physical Therapy Evaluation Patient Details Name: Cathy Spears MRN: [id number] DOB: [birth year] Today's Date: 02/02/2022  History of Present Illness  Cathy Spears is a 86 y.o. female with medical history significant for A-fib on Eliquis, HTN, breast cancer, GERD, recent hospitalization from 7/2 to 7/4 for CHF and possible laryngeal spasm with normal flex laryngoscopy, who returns to the ED with a complaint of shortness of breath that started the day prior associated with cough productive of white phlegm  Clinical Impression  Pt seen for PT evaluation with co-tx with OT. Pt's daughter Cathy Spears) present throughout session & pt understands 56 [place], with daughter assisting with occasional word translation.  Prior to admission pt was independent without AD, active (recently baked for 4 days to prep desserts for family member's wedding), & living with her husband in a 1 level home. On this date, pt is able to complete bed mobility without assistance, STS with close supervision & ambulate without AD with close supervision with slow, shuffled gait but appears to be close to baseline. Will continue to follow pt acutely to address endurance, balance, and safety with mobility but do not anticipate pt will require PT f/u. Daughter educated on recommendations & cleared to ambulate with pt while in acute setting.  Pt received on 2L/min but placed on room air with SpO2 >90% throughout session Max HR during gait 140 bpm with pt declining any symptoms.   Recommendations for follow up therapy are one component of a multi-disciplinary discharge planning process, led by the attending physician.  Recommendations may be updated based on patient status, additional functional criteria and insurance authorization.  Follow Up Recommendations No PT follow up      Assistance Recommended at Discharge PRN  Patient can return home with the following  Assistance with cooking/housework;Assist for  transportation;Help with stairs or ramp for entrance    Equipment Recommendations None recommended by PT  Recommendations for Other Services       Functional Status Assessment Patient has had a recent decline in their functional status and demonstrates the ability to make significant improvements in function in a reasonable and predictable amount of time.     Precautions / Restrictions Precautions Precautions: Fall Precaution Comments: watch HR Restrictions Weight Bearing Restrictions: No      Mobility  Bed Mobility Overal bed mobility: Needs Assistance Bed Mobility: Supine to Sit, Sit to Supine     Supine to sit: Modified independent (Device/Increase time), HOB elevated Sit to supine: Modified independent (Device/Increase time), HOB elevated        Transfers Overall transfer level: Needs assistance   Transfers: Sit to/from Stand Sit to Stand: Supervision                Ambulation/Gait Ambulation/Gait assistance: Supervision Gait Distance (Feet): 150 Feet Assistive device: None Gait Pattern/deviations: Decreased step length - right, Decreased step length - left, Decreased dorsiflexion - right, Decreased dorsiflexion - left, Decreased stride length, Shuffle Gait velocity: decreased        Stairs            Wheelchair Mobility    Modified Rankin (Stroke Patients Only)       Balance Overall balance assessment: Needs assistance Sitting-balance support: Feet supported Sitting balance-Leahy Scale: Good     Standing balance support: No upper extremity supported, During functional activity Standing balance-Leahy Scale: Good  Pertinent Vitals/Pain Pain Assessment Pain Assessment: No/denies pain Faces Pain Scale: No hurt    Home Living Family/patient expects to be discharged to:: Private residence Living Arrangements: Spouse/significant other Available Help at Discharge: Family Type of Home:  House Home Access: Stairs to enter Entrance Stairs-Rails: Right Entrance Stairs-Number of Steps: 4-5   Home Layout: One level Home Equipment: Shower seat;Grab bars - tub/shower      Prior Function Prior Level of Function : Independent/Modified Independent             Mobility Comments: amb with no AD, very active (recently cooked for ~4 days to prepare desserts for family wedding) ADLs Comments: MOD I-I in all ADL/IADL; does not drive, grocery shops, laundry, cleans; pt recently made a large amount of pastry for her grandsons wedding last week     Hand Dominance        Extremity/Trunk Assessment   Upper Extremity Assessment Upper Extremity Assessment: Overall WFL for tasks assessed    Lower Extremity Assessment Lower Extremity Assessment: Generalized weakness    Cervical / Trunk Assessment Cervical / Trunk Assessment: Kyphotic (rounded shoulders)  Communication   Communication: No difficulties  Cognition Arousal/Alertness: Awake/alert Behavior During Therapy: WFL for tasks assessed/performed Overall Cognitive Status: Within Functional Limits for tasks assessed                                          General Comments General comments (skin integrity, edema, etc.): HR up to 140s with extended amb in hallway, recovered to 100-110 within 30 seconds; SPO2 >90% on RA throughout; no c/o dizziness, nausea, chest pains/palipations    Exercises     Assessment/Plan    PT Assessment Patient needs continued PT services  PT Problem List Decreased strength;Decreased mobility;Decreased activity tolerance;Decreased balance;Cardiopulmonary status limiting activity       PT Treatment Interventions DME instruction;Therapeutic exercise;Balance training;Gait training;Stair training;Neuromuscular re-education;Functional mobility training;Therapeutic activities;Patient/family education    PT Goals (Current goals can be found in the Care Plan section)  Acute Rehab  PT Goals Patient Stated Goal: get better PT Goal Formulation: With patient/family Time For Goal Achievement: 02/16/22 Potential to Achieve Goals: Good    Frequency Min 2X/week     Co-evaluation PT/OT/SLP Co-Evaluation/Treatment: Yes Reason for Co-Treatment:  (anticipated pt required +2 assist & 2/2 language barrier but both turned out to be nonissues) PT goals addressed during session: Mobility/safety with mobility;Balance OT goals addressed during session: ADL's and self-care       AM-PAC PT "6 Clicks" Mobility  Outcome Measure Help needed turning from your back to your side while in a flat bed without using bedrails?: None Help needed moving from lying on your back to sitting on the side of a flat bed without using bedrails?: None Help needed moving to and from a bed to a chair (including a wheelchair)?: A Little Help needed standing up from a chair using your arms (e.g., wheelchair or bedside chair)?: A Little Help needed to walk in hospital room?: A Little Help needed climbing 3-5 steps with a railing? : A Little 6 Click Score: 20    End of Session   Activity Tolerance: Patient tolerated treatment well Patient left: in bed;with family/visitor present (in care of OT) Nurse Communication: Mobility status (O2) PT Visit Diagnosis: Muscle weakness (generalized) (M62.81);Other abnormalities of gait and mobility (R26.89)    Time: 7026-3785 PT Time Calculation (min) (ACUTE ONLY):  14 min   Charges:   PT Evaluation $PT Eval Low Complexity: [place], PT, DPT 02/02/22, 2:29 PM   [name] 02/02/2022, 2:26 PM

## 2022-02-02 NOTE — ED Notes (Signed)
Report given to [place], RN

## 2022-02-02 NOTE — Assessment & Plan Note (Signed)
Appears euvolemic and improved from recent hospitalization Continue furosemide and metoprolol

## 2022-02-02 NOTE — Assessment & Plan Note (Signed)
DuoNebs, scheduled and as needed Oral steroids

## 2022-02-02 NOTE — ED Provider Notes (Signed)
[facility] Provider Note    Event Date/Time   First MD Initiated Contact with Patient 02/02/22 0115     (approximate)   History   Shortness of Breath   HPI  Cathy Spears is a 86 y.o. female who presents to the ED for evaluation of Shortness of Breath   I reviewed cardiology clinic visit from 7/6.  Atrial fibrillation on Eliquis.  Recently diagnosed with CHF, normal ejection fraction, during a recent medical admission from 7/2-4. During this clinic visit from 36 hours ago she was reportedly with improved swelling, denies shortness of breath but with persistent and mild cough. I reviewed pulmonology clinic visit from 2021.  History of COPD.  Patient does not have any inhalers at home.  Patient presents to the ED with her daughter for evaluation of continued cough and shortness of breath.  They report that she has had a cough for 8 or 9 days now, productive of increased sputum and sometimes pink streaking of small-volume blood the past couple days.  No chest pain.  Reports dyspnea associated with coughing fits.  Low extremity edema seems to be improved overall from last week.  Physical Exam   Triage Vital Signs: ED Triage Vitals  Enc Vitals Group     BP 02/01/22 2008 124/69     Pulse Rate 02/01/22 2008 74     Resp 02/01/22 2008 20     Temp 02/01/22 2008 98.1 F (36.7 C)     Temp Source 02/01/22 2008 Oral     SpO2 02/01/22 2006 96 %     Weight 02/01/22 2008 183 lb 13.8 oz (83.4 kg)     Height 02/01/22 2008 '5\' 2"'$  (1.575 m)     Head Circumference --      Peak Flow --      Pain Score 02/01/22 2008 0     Pain Loc --      Pain Edu? --      Excl. in [place]? --     Most recent vital signs: Vitals:   02/02/22 0630 02/02/22 0645  BP: 103/77 (!) 102/59  Pulse: 91 87  Resp: 20 19  Temp:    SpO2: 96% 96%    General: Awake, no distress.  CV:  Good peripheral perfusion.  Irregular Resp:  Normal effort.  Tachypneic to the mid 20s.  In expiratory  wheezes are present with good airflow throughout.  No focal features. Abd:  No distention.  MSK:  No deformity noted.  Neuro:  No focal deficits appreciated. Other:     ED Results / Procedures / Treatments   Labs (all labs ordered are listed, but only abnormal results are displayed) Labs Reviewed  BASIC METABOLIC PANEL - Abnormal; Notable for the following components:      Result Value   Sodium 132 (*)    Chloride 95 (*)    Glucose, Bld 107 (*)    BUN 25 (*)    Calcium 8.8 (*)    All other components within normal limits  CBC - Abnormal; Notable for the following components:   MCV 79.8 (*)    MCH 25.9 (*)    All other components within normal limits  BRAIN NATRIURETIC PEPTIDE - Abnormal; Notable for the following components:   B Natriuretic Peptide 127.4 (*)    All other components within normal limits  HIV ANTIBODY (ROUTINE TESTING W REFLEX)  TROPONIN I (HIGH SENSITIVITY)  TROPONIN I (HIGH SENSITIVITY)    EKG Atrial fibrillation  with a rate of 87 bpm.  Normal axis and intervals apparent.  Tremulous baseline makes fine detail difficult to appreciate.  No clear ischemic features.  RADIOLOGY 2 view CXR interpreted by me with cardiomegaly  Official radiology report(s): DG Chest 2 View  Result Date: 02/01/2022 CLINICAL DATA:  Short of breath since yesterday EXAM: CHEST - 2 VIEW COMPARISON:  01/27/2022 FINDINGS: Frontal and lateral views of the chest demonstrates stable cardiac silhouette. No acute airspace disease, effusion, or pneumothorax. Stable hyperinflation. No acute bony abnormalities. IMPRESSION: 1. Stable enlarged cardiac silhouette.  No acute airspace disease. Electronically Signed   By: Randa Ngo M.D.   On: 02/01/2022 20:47    PROCEDURES and INTERVENTIONS:  .1-3 Lead EKG Interpretation  Performed by: Vladimir Crofts, MD Authorized by: Vladimir Crofts, MD     Interpretation: abnormal     ECG rate:  106   ECG rate assessment: tachycardic     Rhythm: atrial  fibrillation     Ectopy: none     Conduction: normal   .Critical Care  Performed by: Vladimir Crofts, MD Authorized by: Vladimir Crofts, MD   Critical care provider statement:    Critical care time (minutes):  30   Critical care time was exclusive of:  Separately billable procedures and treating other patients   Critical care was necessary to treat or prevent imminent or life-threatening deterioration of the following conditions:  Circulatory failure and cardiac failure   Critical care was time spent personally by me on the following activities:  Development of treatment plan with patient or surrogate, discussions with consultants, evaluation of patient's response to treatment, examination of patient, ordering and review of laboratory studies, ordering and review of radiographic studies, ordering and performing treatments and interventions, pulse oximetry, re-evaluation of patient's condition and review of old charts   Medications  diltiazem (CARDIZEM) 125 mg in dextrose 5% 125 mL (1 mg/mL) infusion (5 mg/hr Intravenous New Bag/Given 02/02/22 0400)  furosemide (LASIX) tablet 40 mg (has no administration in time range)  metoprolol tartrate (LOPRESSOR) tablet 50 mg (has no administration in time range)  apixaban (ELIQUIS) tablet 5 mg (has no administration in time range)  pantoprazole (PROTONIX) EC tablet 40 mg (has no administration in time range)  potassium chloride SA (KLOR-CON M) CR tablet 20 mEq (has no administration in time range)  guaiFENesin (MUCINEX) 12 hr tablet 600 mg (has no administration in time range)  multivitamin-lutein (OCUVITE-LUTEIN) capsule 1 capsule (has no administration in time range)  acetaminophen (TYLENOL) tablet 650 mg (has no administration in time range)  ondansetron (ZOFRAN) injection 4 mg (has no administration in time range)  ALPRAZolam (XANAX) tablet 0.25 mg (has no administration in time range)  ipratropium-albuterol (DUONEB) 0.5-2.5 (3) MG/3ML nebulizer solution 3  mL (has no administration in time range)  albuterol (PROVENTIL) (2.5 MG/3ML) 0.083% nebulizer solution 2.5 mg (has no administration in time range)  methylPREDNISolone sodium succinate (SOLU-MEDROL) 40 mg/mL injection 40 mg (has no administration in time range)    Followed by  predniSONE (DELTASONE) tablet 40 mg (has no administration in time range)  ipratropium-albuterol (DUONEB) 0.5-2.5 (3) MG/3ML nebulizer solution 3 mL (3 mLs Nebulization Given 02/02/22 0204)  methylPREDNISolone sodium succinate (SOLU-MEDROL) 125 mg/2 mL injection 125 mg (125 mg Intravenous Given 02/02/22 0212)  doxycycline (VIBRA-TABS) tablet 100 mg (100 mg Oral Given 02/02/22 0204)  LORazepam (ATIVAN) injection 0.5 mg (0.5 mg Intravenous Given 02/02/22 0355)  diltiazem (CARDIZEM) injection 10 mg (10 mg Intravenous Given 02/02/22 0357)  IMPRESSION / MDM / ASSESSMENT AND PLAN / ED COURSE  I reviewed the triage vital signs and the nursing notes.  Differential diagnosis includes, but is not limited to, CHF exacerbation, COPD exacerbation, ACS, PE  {Patient presents with symptoms of an acute illness or injury that is potentially life-threatening.  Pleasant 86 year old female presents with greater than 1 week of cough and shortness of breath, likely a COPD exacerbation.  Her BNP is downtrending and is not grossly volume overloaded, doubt CHF exacerbation.  EKG is nonischemic and troponins are low.  Doubt ACS.  CBC and metabolic panel are reassuring.  No leukocytosis or signs of sepsis.  X-ray is clear.  Suspect bronchitis precipitating a COPD exacerbation.  We will treat this and reassess.  While her respiratory status seemed to improve with DuoNebs, did unfortunately precipitate A-fib with RVR.  Patient is very concerned about her throat closing but I see no evidence of upper airway obstruction and of note she had a normal laryngoscopy with ENT during her recent admission due to similar concerns for this.  I suspect degree of anxiety  precipitating much of this.  After IV anxiolytics and diltiazem, she remains tachycardic./We will get her started on a diltiazem drip and consult with medicine for admission.  Clinical Course as of 02/02/22 7416  Sat Feb 02, 2022  0304 Reassessed, improved airflow on auscultation, wheezing is louder.  Patient is anxious, sitting up in bed and concerned that she cannot breathe.  A-fib with RVR now. [DS]  [phone] I consult with medicine who agrees to admit [DS]    Clinical Course User Index [DS] Vladimir Crofts, MD     FINAL CLINICAL IMPRESSION(S) / ED DIAGNOSES   Final diagnoses:  Atrial fibrillation with RVR ([place])  Shortness of breath  COPD exacerbation ([place])     Rx / DC Orders   ED Discharge Orders          Ordered    Amb referral to AFIB Clinic        02/02/22 0448             Note:  This document was prepared using Dragon voice recognition software and may include unintentional dictation errors.   Vladimir Crofts, MD 02/02/22 [phone]

## 2022-02-02 NOTE — Progress Notes (Signed)
PT Cancellation Note  Patient Details Name: Cathy Spears MRN: [id number] DOB: Jun 16, 1936   Cancelled Treatment:    Reason Eval/Treat Not Completed: Patient not medically ready PT orders received, chart reviewed. Pt initially on cardizem drip but then noticed drip was d/c, but now has orders for imaging to r/o PE. Will hold PT evaluation until results are in.   Lavone Nian, PT, DPT 02/02/22, 11:26 AM   [name] 02/02/2022, 11:25 AM

## 2022-02-02 NOTE — Assessment & Plan Note (Addendum)
Continue diltiazem infusion and wean as tolerated Continue metoprolol and apixaban

## 2022-02-02 NOTE — H&P (Signed)
History and Physical    Patient: Cathy Spears JSH:[id number] DOB: [birth year] DOA: 02/02/2022 DOS: the patient was seen and examined on 02/02/2022 PCP: Rusty Aus, MD  Patient coming from: Home  Chief Complaint:  Chief Complaint  Patient presents with   Shortness of Breath    HPI: Cathy Spears is a 86 y.o. female with medical history significant for A-fib on Eliquis, HTN, breast cancer, GERD, recent hospitalization from 7/2 to 7/4 for CHF and possible laryngeal spasm with normal flex laryngoscopy, who returns to the ED with a complaint of shortness of breath that started the day prior associated with cough productive of white phlegm.  She denies fever or chills.  States that she had a cough and shortness of breath during her recent hospitalization and mentions a remote history of COPD but no current prescriptions for inhalers. ED course and data review: Mildly tachycardic at 110 and mildly tachypneic at 21-25 in the ED with otherwise normal vitals CBC and BMP mostly unremarkable except for mild hyponatremia of 132 Troponin 15 and BNP 127 EKG, personally viewed and interpreted showing A-fib at 87 with no acute ST-T wave changes Chest x-ray with no acute airspace disease  Patient was placed on a diltiazem infusion as heart rate was initially in the 1 teens and was treated with a DuoNeb Solu-Medrol and doxycycline for possible acute bronchitis.  Observation requested.     Past Medical History:  Diagnosis Date   Anxiety    Breast cancer ([place])    Cancer ([place])    ,bilateral lymph nodes removed  breast mostly from the left   Cataracts, bilateral    Complication of anesthesia    Dysrhythmia    hx of AF   Esophageal erosions    GERD (gastroesophageal reflux disease)    Hypertension    dr Saralyn Pilar  at [facility] clinic   Personal history of radiation therapy    PONV (postoperative nausea and vomiting)    spinal anesthesia x1   Past Surgical History:  Procedure Laterality  Date   BALLOON DILATION N/A 09/24/2017   Procedure: BALLOON DILATION;  Surgeon: Toledo, Benay Pike, MD;  Location: [facility] ENDOSCOPY;  Service: Gastroenterology;  Laterality: N/A;   BREAST BIOPSY Right    BREAST LUMPECTOMY Bilateral    2010   BREAST SURGERY     lumpectomy bil,pt. states lymph nodes were removed and  the left arm is restricted   COLONOSCOPY WITH ESOPHAGOGASTRODUODENOSCOPY (EGD)     ESOPHAGOGASTRODUODENOSCOPY (EGD) WITH PROPOFOL N/A 09/24/2017   Procedure: ESOPHAGOGASTRODUODENOSCOPY (EGD) WITH PROPOFOL;  Surgeon: Toledo, Benay Pike, MD;  Location: [facility] ENDOSCOPY;  Service: Gastroenterology;  Laterality: N/A;   HERNIA REPAIR     LUMBAR LAMINECTOMY/DECOMPRESSION MICRODISCECTOMY  08/22/2011   Procedure: LUMBAR LAMINECTOMY/DECOMPRESSION MICRODISCECTOMY;  Surgeon: Ophelia Charter, MD;  Location: [place] [facility]URO ORS;  Service: Neurosurgery;  Laterality: Right;  RIGHT Lumbar five sacral one  laminectomy and microdiscectomy   TOTAL KNEE ARTHROPLASTY Right 01/14/2019   Procedure: TOTAL KNEE ARTHROPLASTY RIGHT;  Surgeon: Corky Mull, MD;  Location: [facility] ORS;  Service: Orthopedics;  Laterality: Right;   Social History:  reports that she has never smoked. She has never used smokeless tobacco. She reports that she does not drink alcohol and does not use drugs.  Allergies  Allergen Reactions   Fish-Derived Products Swelling    Blue Fish years ago.  Caused redness over all of face   Oxycodone Swelling    History reviewed. No pertinent family history.  Prior to  Admission medications   Medication Sig Start Date End Date Taking? Authorizing Provider  apixaban (ELIQUIS) 5 MG TABS tablet Take 1 tablet (5 mg total) by mouth 2 (two) times daily. 01/29/22 02/28/22  [name], MD  Calcium Carbonate-Vitamin D (TH CALCIUM CARBONATE-VITAMIN D) 600-400 MG-UNIT tablet Take 1 tablet by mouth 2 (two) times daily.    [provider]  cholecalciferol (VITAMIN D) 1000 UNITS tablet Take 1,000 Units by  mouth daily.    [provider]  diltiazem (CARDIZEM CD) 120 MG 24 hr capsule Take 1 capsule (120 mg total) by mouth daily. 01/17/19   Reche Dixon, PA-C  furosemide (LASIX) 40 MG tablet Take 1 tablet (40 mg total) by mouth daily. 01/30/22 03/01/22  [name], MD  guaiFENesin (MUCINEX) 600 MG 12 hr tablet Take 1 tablet (600 mg total) by mouth 2 (two) times daily as needed for up to 14 days for to loosen phlegm. 01/29/22 02/12/22  [name], MD  metoprolol (LOPRESSOR) 50 MG tablet Take 50 mg by mouth 2 (two) times daily.    [provider]  Multiple Vitamins-Minerals (PRESERVISION AREDS PO) Take 1 tablet by mouth 2 (two) times daily.    [provider]  omeprazole (PRILOSEC) 40 MG capsule Take 40 mg by mouth 2 (two) times a day.     [provider]  potassium chloride SA (KLOR-CON M) 20 MEQ tablet Take 1 tablet (20 mEq total) by mouth daily. Take one tablet (20 mEq) with your FUROSEMIDE dose on days when you are taking it. 01/29/22 02/28/22  [name], MD  vitamin B-12 (CYANOCOBALAMIN) 1000 MCG tablet Take 1,000 mcg by mouth daily.    [provider]    Physical Exam: Vitals:   02/01/22 2244 02/02/22 0131 02/02/22 0200 02/02/22 0402  BP: 123/67 135/69 119/72 101/61  Pulse: 90 (!) 110 96 100  Resp: 17 (!) 21 (!) 25 18  Temp:      TempSrc:      SpO2: 94% 98% 95%   Weight:      Height:       Physical Exam Vitals and nursing note reviewed.  Constitutional:      General: She is not in acute distress. HENT:     Head: Normocephalic and atraumatic.  Cardiovascular:     Rate and Rhythm: Tachycardia present. Rhythm irregular.     Heart sounds: Normal heart sounds.  Pulmonary:     Effort: Tachypnea present.     Breath sounds: Normal breath sounds.  Abdominal:     Palpations: Abdomen is soft.     Tenderness: There is no abdominal tenderness.  Neurological:     Mental Status: Mental status is at baseline.     Labs on  Admission: I have personally reviewed following labs and imaging studies  CBC: Recent Labs  Lab 01/26/22 1451 01/27/22 1834 01/28/22 0628 01/29/22 0549 02/01/22 2010  WBC 4.1 3.9* 3.4* 7.7 7.3  NEUTROABS 2.8 2.3  --   --   --   HGB 11.8* 12.3 12.9 13.7 12.7  HCT 37.5 39.7 40.8 41.5 39.1  MCV 83.0 83.4 83.1 79.7* 79.8*  PLT 204 218 222 262 182   Basic Metabolic Panel: Recent Labs  Lab 01/26/22 1451 01/27/22 1834 01/28/22 0628 01/29/22 0549 02/01/22 2010  NA 137 139 136 132* 132*  K 3.8 4.0 4.2 3.6 3.8  CL 104 101 99 93* 95*  CO2 '24 28 29 28 27  '$ GLUCOSE 106* 111* 164* 122*  107*  BUN '18 18 18 '$ 24* 25*  CREATININE 0.77 0.77 0.69 0.69 0.78  CALCIUM 9.1 9.4 9.4 9.4 8.8*   GFR: Estimated Creatinine Clearance: 52.4 mL/min (by C-G formula based on SCr of 0.78 mg/dL). Liver Function Tests: Recent Labs  Lab 01/26/22 1451  AST 23  ALT 16  ALKPHOS 67  BILITOT 0.6  PROT 6.9  ALBUMIN 3.9   No results for input(s): "LIPASE", "AMYLASE" in the last 168 hours. No results for input(s): "AMMONIA" in the last 168 hours. Coagulation Profile: No results for input(s): "INR", "PROTIME" in the last 168 hours. Cardiac Enzymes: No results for input(s): "CKTOTAL", "CKMB", "CKMBINDEX", "TROPONINI" in the last 168 hours. BNP (last 3 results) No results for input(s): "PROBNP" in the last 8760 hours. HbA1C: No results for input(s): "HGBA1C" in the last 72 hours. CBG: No results for input(s): "GLUCAP" in the last 168 hours. Lipid Profile: No results for input(s): "CHOL", "HDL", "LDLCALC", "TRIG", "CHOLHDL", "LDLDIRECT" in the last 72 hours. Thyroid Function Tests: No results for input(s): "TSH", "T4TOTAL", "FREET4", "T3FREE", "THYROIDAB" in the last 72 hours. Anemia Panel: No results for input(s): "VITAMINB12", "FOLATE", "FERRITIN", "TIBC", "IRON", "RETICCTPCT" in the last 72 hours. Urine analysis:    Component Value Date/Time   COLORURINE STRAW (A) 01/16/2019 1024   APPEARANCEUR  CLEAR (A) 01/16/2019 1024   LABSPEC 1.005 01/16/2019 1024   PHURINE 7.0 01/16/2019 1024   GLUCOSEU NEGATIVE 01/16/2019 1024   HGBUR NEGATIVE 01/16/2019 1024   BILIRUBINUR NEGATIVE 01/16/2019 1024   KETONESUR 5 (A) 01/16/2019 1024   PROTEINUR NEGATIVE 01/16/2019 1024   NITRITE NEGATIVE 01/16/2019 1024   LEUKOCYTESUR NEGATIVE 01/16/2019 1024    Radiological Exams on Admission: DG Chest 2 View  Result Date: 02/01/2022 CLINICAL DATA:  Short of breath since yesterday EXAM: CHEST - 2 VIEW COMPARISON:  01/27/2022 FINDINGS: Frontal and lateral views of the chest demonstrates stable cardiac silhouette. No acute airspace disease, effusion, or pneumothorax. Stable hyperinflation. No acute bony abnormalities. IMPRESSION: 1. Stable enlarged cardiac silhouette.  No acute airspace disease. Electronically Signed   By: Randa Ngo M.D.   On: 02/01/2022 20:47     Data Reviewed: Relevant notes from primary care and specialist visits, past discharge summaries as available in EHR, including Care Everywhere. Prior diagnostic testing as pertinent to current admission diagnoses Updated medications and problem lists for reconciliation ED course, including vitals, labs, imaging, treatment and response to treatment Triage notes, nursing and pharmacy notes and ED provider's notes Notable results as noted in HPI   Assessment and Plan: * Paroxysmal atrial fibrillation with rapid ventricular response (HCC) Continue diltiazem infusion and wean as tolerated Continue metoprolol and apixaban  COPD with acute exacerbation ([place]) DuoNebs, scheduled and as needed Oral steroids  Chronic diastolic CHF (congestive heart failure) ([place]) Appears euvolemic and improved from recent hospitalization Continue furosemide and metoprolol      DVT prophylaxis: Apixaban  Consults: none  Advance Care Planning:   Code Status: Prior   Family Communication: daughter at bedside  Disposition Plan: Back to previous home  environment  Severity of Illness: The appropriate patient status for this patient is OBSERVATION. Observation status is judged to be reasonable and necessary in order to provide the required intensity of service to ensure the patient's safety. The patient's presenting symptoms, physical exam findings, and initial radiographic and laboratory data in the context of their medical condition is felt to place them at decreased risk for further clinical deterioration. Furthermore, it is anticipated that the patient will be  medically stable for discharge from the hospital within 2 midnights of admission.   Author: Athena Masse, MD 02/02/2022 4:45 AM  For on call review [email or web address].

## 2022-02-02 NOTE — Evaluation (Signed)
Occupational Therapy Evaluation Patient Details Name: Cathy Spears MRN: [id number] DOB: 20-Jun-1936 Today's Date: 02/02/2022   History of Present Illness Cathy Spears is a 86 y.o. female with medical history significant for A-fib on Eliquis, HTN, breast cancer, GERD, recent hospitalization from 7/2 to 7/4 for CHF and possible laryngeal spasm with normal flex laryngoscopy, who returns to the ED with a complaint of shortness of breath that started the day prior associated with cough productive of white phlegm   Clinical Impression   Chart reviewed, RN cleared pt for participation in OT evaluation. Pt daughter present throughout and provides interpretation as needed per pt request. Prior to admission pt was MOD I in all ADL/IADL, cooks, cleans, grocery shops. Amb community distances with no AD. Pt reports she feels back to baseline, daughter endorses pt may be slightly weaker than baseline and that pt typically is like the "energizer bunny". At this time requires increased assist for ADLs, decreased activity tolerance. Pt would benefit from acute OT to provide education re: activity modification and energy conservation to facilitate safe and optimal ADL/IADL completion. Pt is left as received, NAD, all needs met. No OT recommended following discharge.   Of note: HR up to 140 with ambulation, spo2 >90% on RA during mobility.      Recommendations for follow up therapy are one component of a multi-disciplinary discharge planning process, led by the attending physician.  Recommendations may be updated based on patient status, additional functional criteria and insurance authorization.   Follow Up Recommendations  No OT follow up    Assistance Recommended at Discharge Intermittent Supervision/Assistance  Patient can return home with the following A little help with bathing/dressing/bathroom;Assistance with cooking/housework;Assist for transportation    Functional Status Assessment  Patient  has had a recent decline in their functional status and demonstrates the ability to make significant improvements in function in a reasonable and predictable amount of time.  Equipment Recommendations  Other (comment) (pt has recommended equipment)    Recommendations for Other Services       Precautions / Restrictions Precautions Precautions: Fall Precaution Comments: watch HR Restrictions Weight Bearing Restrictions: No      Mobility Bed Mobility Overal bed mobility: Needs Assistance Bed Mobility: Supine to Sit, Sit to Supine     Supine to sit: Modified independent (Device/Increase time), HOB elevated Sit to supine: Modified independent (Device/Increase time), HOB elevated        Transfers Overall transfer level: Needs assistance   Transfers: Sit to/from Stand Sit to Stand: Supervision, Min guard                  Balance Overall balance assessment: Needs assistance Sitting-balance support: Feet supported Sitting balance-Leahy Scale: Good     Standing balance support: No upper extremity supported Standing balance-Leahy Scale: Good                             ADL either performed or assessed with clinical judgement   ADL Overall ADL's : Needs assistance/impaired Eating/Feeding: Set up;Sitting               Upper Body Dressing : Minimal assistance;Standing   Lower Body Dressing: Minimal assistance;Sit to/from stand   Toilet Transfer: Supervision/safety;Ambulation Toilet Transfer Details (indicate cue type and reason): simulated         Functional mobility during ADLs: Supervision/safety       Vision Patient Visual Report: No change from baseline  Perception     Praxis      Pertinent Vitals/Pain Pain Assessment Pain Assessment: Faces Faces Pain Scale: No hurt     Hand Dominance     Extremity/Trunk Assessment Upper Extremity Assessment Upper Extremity Assessment: Overall WFL for tasks assessed (able to boost  herself in bed with BUE and use of bed features)   Lower Extremity Assessment Lower Extremity Assessment: Defer to PT evaluation       Communication Communication Communication: No difficulties   Cognition Arousal/Alertness: Awake/alert   Overall Cognitive Status: Within Functional Limits for tasks assessed                                       General Comments  HR up to 140s with extended amb in hallway, recovered to 100-110 within 30 seconds; SPO2 >90% on RA throughout; no c/o dizziness, nausea, chest pains/palipations    Exercises Other Exercises Other Exercises: edu re: role of OT, role of rehab, discharge recommendations, energy conservation and activity modification techniques   Shoulder Instructions      Home Living Family/patient expects to be discharged to:: Private residence Living Arrangements: Spouse/significant other Available Help at Discharge: Family Type of Home: House Home Access: Stairs to enter Technical brewer of Steps: 4-5 Entrance Stairs-Rails: Right Home Layout: One level     Bathroom Shower/Tub: Occupational psychologist: Handicapped height     Home Equipment: Shower seat;Grab bars - tub/shower          Prior Functioning/Environment Prior Level of Function : Independent/Modified Independent             Mobility Comments: amb with no AD ADLs Comments: MOD I-I in all ADL/IADL; does not drive, grocery shops, laundry, cleans; pt recently made a large amount of pastry for her grandsons wedding last week        OT Problem List: Decreased activity tolerance;Cardiopulmonary status limiting activity;Decreased knowledge of use of DME or AE      OT Treatment/Interventions: Self-care/ADL training;Patient/family education;Therapeutic exercise;Energy conservation;DME and/or AE instruction;Therapeutic activities    OT Goals(Current goals can be found in the care plan section) Acute Rehab OT Goals Patient Stated  Goal: go home OT Goal Formulation: With patient Time For Goal Achievement: 02/16/22 Potential to Achieve Goals: Good ADL Goals Pt Will Perform Lower Body Dressing: (P) with modified independence;sit to/from stand Pt Will Transfer to Toilet: (P) with modified independence;ambulating Pt Will Perform Toileting - Clothing Manipulation and hygiene: (P) with modified independence  OT Frequency: Min 2X/week    Co-evaluation PT/OT/SLP Co-Evaluation/Treatment: Yes Reason for Co-Treatment: Complexity of the patient's impairments (multi-system involvement)   OT goals addressed during session: ADL's and self-care      AM-PAC OT "6 Clicks" Daily Activity     Outcome Measure Help from another person eating meals?: None Help from another person taking care of personal grooming?: None Help from another person toileting, which includes using toliet, bedpan, or urinal?: A Little Help from another person bathing (including washing, rinsing, drying)?: A Little Help from another person to put on and taking off regular upper body clothing?: A Little Help from another person to put on and taking off regular lower body clothing?: A Little 6 Click Score: 20   End of Session Nurse Communication: Mobility status  Activity Tolerance: Patient tolerated treatment well Patient left: in bed;with call bell/phone within reach;with bed alarm set;with family/visitor present  OT  Visit Diagnosis: Unsteadiness on feet (R26.81)                Time: 1251-1310 OT Time Calculation (min): 19 min Charges:  OT General Charges $OT Visit: 1 Visit OT Evaluation $OT Eval Low Complexity: 1 Low Shanon Payor, OTD OTR/L  02/02/22, 3:17 PM

## 2022-02-02 NOTE — Hospital Course (Signed)
Cathy Spears is a 86 y.o. female with medical history significant for A-fib on Eliquis, HTN, breast cancer, GERD, recent hospitalization from 7/2 to 7/4 for CHF and possible laryngeal spasm with normal flex laryngoscopy, who returns to the ED with a complaint of shortness of breath that started the day prior associated with cough productive of white phlegm.  Upon arriving the hospital, patient was found to have paroxysmal atrial fibrillation with rapid ventricular response, started on diltiazem drip, continued on apixaban. Patient was also placed on steroids for COPD exacerbation. CT scan ruled out PE.

## 2022-02-03 ENCOUNTER — Inpatient Hospital Stay: Payer: Medicare HMO

## 2022-02-03 DIAGNOSIS — J9811 Atelectasis: Secondary | ICD-10-CM | POA: Diagnosis present

## 2022-02-03 DIAGNOSIS — J441 Chronic obstructive pulmonary disease with (acute) exacerbation: Secondary | ICD-10-CM

## 2022-02-03 DIAGNOSIS — I110 Hypertensive heart disease with heart failure: Secondary | ICD-10-CM | POA: Diagnosis present

## 2022-02-03 DIAGNOSIS — I480 Paroxysmal atrial fibrillation: Secondary | ICD-10-CM | POA: Diagnosis present

## 2022-02-03 DIAGNOSIS — I5032 Chronic diastolic (congestive) heart failure: Secondary | ICD-10-CM

## 2022-02-03 DIAGNOSIS — Z6833 Body mass index (BMI) 33.0-33.9, adult: Secondary | ICD-10-CM | POA: Diagnosis not present

## 2022-02-03 DIAGNOSIS — B348 Other viral infections of unspecified site: Secondary | ICD-10-CM | POA: Diagnosis present

## 2022-02-03 DIAGNOSIS — K219 Gastro-esophageal reflux disease without esophagitis: Secondary | ICD-10-CM | POA: Diagnosis present

## 2022-02-03 DIAGNOSIS — Z885 Allergy status to narcotic agent status: Secondary | ICD-10-CM | POA: Diagnosis not present

## 2022-02-03 DIAGNOSIS — R053 Chronic cough: Secondary | ICD-10-CM | POA: Diagnosis present

## 2022-02-03 DIAGNOSIS — Z923 Personal history of irradiation: Secondary | ICD-10-CM | POA: Diagnosis not present

## 2022-02-03 DIAGNOSIS — R000 Tachycardia, unspecified: Secondary | ICD-10-CM | POA: Diagnosis present

## 2022-02-03 DIAGNOSIS — E785 Hyperlipidemia, unspecified: Secondary | ICD-10-CM | POA: Diagnosis present

## 2022-02-03 DIAGNOSIS — E871 Hypo-osmolality and hyponatremia: Secondary | ICD-10-CM | POA: Diagnosis present

## 2022-02-03 DIAGNOSIS — R0902 Hypoxemia: Secondary | ICD-10-CM | POA: Diagnosis present

## 2022-02-03 DIAGNOSIS — Z91013 Allergy to seafood: Secondary | ICD-10-CM | POA: Diagnosis not present

## 2022-02-03 DIAGNOSIS — Z853 Personal history of malignant neoplasm of breast: Secondary | ICD-10-CM | POA: Diagnosis not present

## 2022-02-03 DIAGNOSIS — Z7901 Long term (current) use of anticoagulants: Secondary | ICD-10-CM | POA: Diagnosis not present

## 2022-02-03 DIAGNOSIS — F419 Anxiety disorder, unspecified: Secondary | ICD-10-CM | POA: Diagnosis present

## 2022-02-03 DIAGNOSIS — R0602 Shortness of breath: Secondary | ICD-10-CM | POA: Diagnosis present

## 2022-02-03 DIAGNOSIS — E669 Obesity, unspecified: Secondary | ICD-10-CM | POA: Diagnosis present

## 2022-02-03 DIAGNOSIS — H269 Unspecified cataract: Secondary | ICD-10-CM | POA: Diagnosis present

## 2022-02-03 DIAGNOSIS — Z96651 Presence of right artificial knee joint: Secondary | ICD-10-CM | POA: Diagnosis present

## 2022-02-03 DIAGNOSIS — Z79899 Other long term (current) drug therapy: Secondary | ICD-10-CM | POA: Diagnosis not present

## 2022-02-03 DIAGNOSIS — I272 Pulmonary hypertension, unspecified: Secondary | ICD-10-CM | POA: Diagnosis present

## 2022-02-03 LAB — MAGNESIUM: Magnesium: 2.1 mg/dL (ref 1.7–2.4)

## 2022-02-03 LAB — RESPIRATORY PANEL BY PCR

## 2022-02-03 LAB — BASIC METABOLIC PANEL
Anion gap: 10 (ref 5–15)
BUN: 28 mg/dL — ABNORMAL HIGH (ref 8–23)
CO2: 28 mmol/L (ref 22–32)
Calcium: 9 mg/dL (ref 8.9–10.3)
Chloride: 99 mmol/L (ref 98–111)
Creatinine, Ser: 0.97 mg/dL (ref 0.44–1.00)
GFR, Estimated: 58 mL/min — ABNORMAL LOW (ref >60)
Glucose, Bld: 172 mg/dL — ABNORMAL HIGH (ref 70–99)
Potassium: 4.1 mmol/L (ref 3.5–5.1)
Sodium: 137 mmol/L (ref 135–145)

## 2022-02-03 LAB — CBC
HCT: 40.6 % (ref 36.0–46.0)
Hemoglobin: 13.2 g/dL (ref 12.0–15.0)
MCH: 26.1 pg (ref 26.0–34.0)
MCHC: 32.5 g/dL (ref 30.0–36.0)
MCV: 80.2 fL (ref 80.0–100.0)
Platelets: 282 10*3/uL (ref 150–400)
RBC: 5.06 MIL/uL (ref 3.87–5.11)
RDW: 15.3 % (ref 11.5–15.5)
WBC: 9.6 10*3/uL (ref 4.0–10.5)
nRBC: 0 % (ref 0.0–0.2)

## 2022-02-03 LAB — HIV ANTIBODY (ROUTINE TESTING W REFLEX): HIV Screen 4th Generation wRfx: NONREACTIVE

## 2022-02-03 MED ORDER — GUAIFENESIN ER 600 MG PO TB12
1200.0000 mg | ORAL_TABLET | Freq: Two times a day (BID) | ORAL | Status: DC
  Administered 2022-02-03 – 2022-02-08 (×10): 1200 mg via ORAL
  Filled 2022-02-03 (×10): qty 2

## 2022-02-03 MED ORDER — METHYLPREDNISOLONE SODIUM SUCC 40 MG IJ SOLR
40.0000 mg | Freq: Once | INTRAMUSCULAR | Status: AC
  Administered 2022-02-03: 40 mg via INTRAVENOUS
  Filled 2022-02-03: qty 1

## 2022-02-03 MED ORDER — AZITHROMYCIN 250 MG PO TABS
500.0000 mg | ORAL_TABLET | Freq: Every day | ORAL | Status: AC
  Administered 2022-02-03 – 2022-02-07 (×5): 500 mg via ORAL
  Filled 2022-02-03 (×3): qty 2
  Filled 2022-02-03: qty 1
  Filled 2022-02-03: qty 2

## 2022-02-03 MED ORDER — ALPRAZOLAM 0.5 MG PO TABS
0.5000 mg | ORAL_TABLET | Freq: Once | ORAL | Status: DC
  Filled 2022-02-03 (×2): qty 1

## 2022-02-03 MED ORDER — ALBUTEROL SULFATE (2.5 MG/3ML) 0.083% IN NEBU: 2.5000 mg | INHALATION_SOLUTION | RESPIRATORY_TRACT | Status: DC | PRN

## 2022-02-03 MED ORDER — PREDNISONE 20 MG PO TABS: 40.0000 mg | ORAL_TABLET | Freq: Every day | ORAL | Status: DC

## 2022-02-03 MED ORDER — TIOTROPIUM BROMIDE MONOHYDRATE 18 MCG IN CAPS
18.0000 ug | ORAL_CAPSULE | Freq: Every day | RESPIRATORY_TRACT | Status: DC
  Administered 2022-02-03: 18 ug via RESPIRATORY_TRACT
  Filled 2022-02-03: qty 5

## 2022-02-03 MED ORDER — ALPRAZOLAM 0.25 MG PO TABS
0.2500 mg | ORAL_TABLET | Freq: Two times a day (BID) | ORAL | Status: DC | PRN
Start: 2022-02-04 — End: 2022-02-08
  Administered 2022-02-04 – 2022-02-07 (×5): 0.25 mg via ORAL
  Filled 2022-02-03 (×8): qty 1

## 2022-02-03 MED ORDER — ALBUTEROL SULFATE HFA 108 (90 BASE) MCG/ACT IN AERS
1.0000 | INHALATION_SPRAY | RESPIRATORY_TRACT | Status: DC | PRN
  Filled 2022-02-03 (×2): qty 6.7

## 2022-02-03 MED ORDER — MELATONIN 5 MG PO TABS
2.5000 mg | ORAL_TABLET | Freq: Every day | ORAL | Status: DC
  Administered 2022-02-03 – 2022-02-06 (×4): 2.5 mg via ORAL
  Filled 2022-02-03 (×5): qty 1

## 2022-02-03 MED ORDER — GUAIFENESIN-DM 100-10 MG/5ML PO SYRP
5.0000 mL | ORAL_SOLUTION | ORAL | Status: DC | PRN
  Administered 2022-02-05 – 2022-02-07 (×3): 5 mL via ORAL
  Filled 2022-02-03: qty 5
  Filled 2022-02-03 (×4): qty 10

## 2022-02-03 MED ORDER — ALBUTEROL SULFATE HFA 108 (90 BASE) MCG/ACT IN AERS: 2.0000 | INHALATION_SPRAY | RESPIRATORY_TRACT | Status: DC | PRN

## 2022-02-03 NOTE — Progress Notes (Signed)
  Progress Note   Patient: Cathy Spears HAL:[id number] DOB: 07-10-1936 DOA: 02/02/2022     0 DOS: the patient was seen and examined on 02/03/2022   Brief hospital course: SHANETA CERVENKA is a 85 y.o. female with medical history significant for A-fib on Eliquis, HTN, breast cancer, GERD, recent hospitalization from 7/2 to 7/4 for CHF and possible laryngeal spasm with normal flex laryngoscopy, who returns to the ED with a complaint of shortness of breath that started the day prior associated with cough productive of white phlegm.  Upon arriving the hospital, patient was found to have paroxysmal atrial fibrillation with rapid ventricular response, started on diltiazem drip, continued on apixaban. Patient was also placed on steroids for COPD exacerbation. CT scan ruled out PE.  Assessment and Plan:  Paroxysmal atrial fibrillation with rapid ventricular response. Heart rate much better controlled. Continue apixaban.  COPD exacerbation. Pulm hypertension. Patient still has significant cough, with large amount of white mucus. She could not tolerate the nebulization.  She missed a dose of IV Solu-Medrol last night, will give a dose today and followed with daily prednisone. Add albuterol inhalers, incentive spirometer. Overall patient condition improving, may be able to discharge tomorrow.   Chronic diastolic congestive heart failure. Condition stable.      Subjective:  Still complaining short of breath with exertion, cough with large white mucus.  No chest pain. Able to walk in the hallway.  Had 2 bowel movements.  Physical Exam: Vitals:   02/03/22 0400 02/03/22 0500 02/03/22 0800 02/03/22 1225  BP: (!) 95/56 (!) 101/58 120/62 108/77  Pulse: 90 84 100 95  Resp: '18 20 20 20  '$ Temp: 97.9 F (36.6 C)   98.2 F (36.8 C)  TempSrc: Oral   Oral  SpO2: 93% 93% 96% 95%  Weight:      Height:       General exam: Appears calm and comfortable  Respiratory system: Decreased breath sounds.  Respiratory effort normal. Cardiovascular system: Irregular. No JVD, murmurs, rubs, gallops or clicks. No pedal edema. Gastrointestinal system: Abdomen is nondistended, soft and nontender. No organomegaly or masses felt. Normal bowel sounds heard. Central nervous system: Alert and oriented. No focal neurological deficits. Extremities: Symmetric 5 x 5 power. Skin: No rashes, lesions or ulcers Psychiatry: Judgement and insight appear normal. Mood & affect appropriate.   Data Reviewed:  All lab results reviewed  Family Communication: Daughter updated at bedside  Disposition: Status is: Inpatient Remains inpatient appropriate because: Severity of disease  Planned Discharge Destination: Home    Time spent: 35 minutes  Author: Sharen Hones, MD 02/03/2022 2:35 PM  For on call review [email or web address].

## 2022-02-03 NOTE — Progress Notes (Signed)
RT arrived to give patient nebulizer treatment. Patient and patient's family member are refusing at this time, stating that patient has been worse since receiving treatment in the ED. They feel like patient may be having adverse reaction to the aerosol therapy. Instructed patient on use of flutter valve to help with secretion clearance. Will continue to monitor.

## 2022-02-03 NOTE — Progress Notes (Signed)
       CROSS COVER NOTE  NAME: HYUN REALI MRN: [id number] DOB : [birth year]    Date of Service   02/03/22  HPI/Events of Note   Today's Vitals   02/03/22 0500 02/03/22 0800 02/03/22 1225 02/03/22 2013  BP: (!) 101/58 120/62 108/77 125/62  Pulse: 84 100 95 79  Resp: '20 20 20 16  '$ Temp:   98.2 F (36.8 C) 98.2 F (36.8 C)  TempSrc:   Oral Oral  SpO2: 93% 96% 95% 98%  Weight:      Height:      PainSc:  0-No pain  0-No pain   Body mass index is 33.63 kg/m.  Breath souds clear Daughter requesting Ativan   Interventions   Plan: Xanax 0.'5mg'$  PO x1 X X       This document was prepared using Dragon voice recognition software and may include unintentional dictation errors.  Neomia Glass DNP, MHA, FNP-BC Nurse Practitioner [facility] Pager [phone] \

## 2022-02-03 NOTE — Progress Notes (Signed)
PT Cancellation Note  Patient Details Name: Cathy Spears MRN: [id number] DOB: Aug 08, 1935   Cancelled Treatment:    Reason Eval/Treat Not Completed: Other (comment)  Pt in room.  Daughter stated MD just arrived for discussion.  Does state she is walking well and has walked x 2 this AM already.  Will defer session at this time.   Chesley Noon 02/03/2022, 11:46 AM

## 2022-02-04 ENCOUNTER — Ambulatory Visit: Payer: Medicare HMO

## 2022-02-04 DIAGNOSIS — J441 Chronic obstructive pulmonary disease with (acute) exacerbation: Secondary | ICD-10-CM | POA: Diagnosis not present

## 2022-02-04 DIAGNOSIS — B348 Other viral infections of unspecified site: Secondary | ICD-10-CM

## 2022-02-04 DIAGNOSIS — I480 Paroxysmal atrial fibrillation: Secondary | ICD-10-CM | POA: Diagnosis not present

## 2022-02-04 MED ORDER — ADULT MULTIVITAMIN W/MINERALS CH
1.0000 | ORAL_TABLET | Freq: Every day | ORAL | Status: DC
  Administered 2022-02-05 – 2022-02-08 (×4): 1 via ORAL
  Filled 2022-02-04 (×4): qty 1

## 2022-02-04 MED ORDER — ENSURE ENLIVE PO LIQD
237.0000 mL | Freq: Two times a day (BID) | ORAL | Status: DC
  Administered 2022-02-05 – 2022-02-08 (×6): 237 mL via ORAL

## 2022-02-04 MED ORDER — METHYLPREDNISOLONE SODIUM SUCC 40 MG IJ SOLR
40.0000 mg | Freq: Two times a day (BID) | INTRAMUSCULAR | Status: DC
  Administered 2022-02-04 – 2022-02-07 (×8): 40 mg via INTRAVENOUS
  Filled 2022-02-04 (×9): qty 1

## 2022-02-04 NOTE — Progress Notes (Signed)
Mobility Specialist - Progress Note   02/04/22 1100  Mobility  Activity Ambulated with assistance in hallway  Level of Assistance Standby assist, set-up cues, supervision of patient - no hands on  Assistive Device None  Distance Ambulated (ft) 320 ft  Activity Response Tolerated well  $Mobility charge 1 Mobility     Pre-mobility: 83 HR, 96% SpO2 During mobility: 101 HR, 95% SpO2 Post-mobility: 90 HR, 94% SpO2   Pt lying in bed upon arrival, utilizing 2L. Pt completed bed mobility independently. Ambulated in hallway on RA with sats maintaining mid 90s throughout session. Pt denied SOB and dizziness. Pt returned to recliner, left on RA with [place] set on 2L and within reach if needed. Daughter at bedside. RN notified.    Kathee Delton Mobility Specialist 02/04/22, 11:41 AM

## 2022-02-04 NOTE — Progress Notes (Signed)
  Progress Note   Patient: Cathy Spears XHF:[id number] DOB: 1936/06/09 DOA: 02/02/2022     1 DOS: the patient was seen and examined on 02/04/2022   Brief hospital course: Cathy Spears is a 86 y.o. female with medical history significant for A-fib on Eliquis, HTN, breast cancer, GERD, recent hospitalization from 7/2 to 7/4 for CHF and possible laryngeal spasm with normal flex laryngoscopy, who returns to the ED with a complaint of shortness of breath that started the day prior associated with cough productive of white phlegm.  Upon arriving the hospital, patient was found to have paroxysmal atrial fibrillation with rapid ventricular response, started on diltiazem drip, continued on apixaban. Patient was also placed on steroids for COPD exacerbation. CT scan ruled out PE.  Assessment and Plan: Paroxysmal atrial fibrillation with rapid ventricular response. Heart rate much better controlled. Continue apixaban.  COPD exacerbation. Parainfluenza infection. Pulm hypertension. Patient still complaining of significant cough with intermittent shortness of breath.  She could not tolerate inhalers or nebulizer.  She could not tolerate Spiriva.  She states that the inhalers made her worse with breathing. I also started IV Solu-Medrol, will continue for now. Patient will be seen by pulmonology.   Chronic diastolic congestive heart failure. No evidence of volume overload     Subjective:  Patient still complaining of cough, could not cough up mucus.  States that all inhalers made short of breath worse.  Physical Exam: Vitals:   02/03/22 2118 02/04/22 0632 02/04/22 0849 02/04/22 1135  BP: 122/76 121/62 119/63   Pulse: 87 83 94   Resp: '16 20 18 19  '$ Temp: 97.7 F (36.5 C) 97.7 F (36.5 C) 97.8 F (36.6 C)   TempSrc: Oral Oral    SpO2: 97% 97% 100%   Weight:      Height:       General exam: Appears calm and comfortable  Respiratory system: Clear to auscultation. Respiratory effort  normal. Cardiovascular system: S1 & S2 heard, RRR. No JVD, murmurs, rubs, gallops or clicks. No pedal edema. Gastrointestinal system: Abdomen is nondistended, soft and nontender. No organomegaly or masses felt. Normal bowel sounds heard. Central nervous system: Alert and oriented. No focal neurological deficits. Extremities: Symmetric 5 x 5 power. Skin: No rashes, lesions or ulcers Psychiatry: Judgement and insight appear normal. Mood & affect appropriate.   Data Reviewed:  Lab results reviewed  Family Communication: Daughter updated  Disposition: Status is: Inpatient Remains inpatient appropriate because: Severity of disease, still has significant  shortness of breath  Planned Discharge Destination: Home with Home Health    Time spent: 35 minutes  Author: Sharen Hones, MD 02/04/2022 12:19 PM  For on call review [email or web address].

## 2022-02-04 NOTE — Progress Notes (Signed)
Physical Therapy Treatment Patient Details Name: Cathy Spears MRN: [id number] DOB: [birth year] Today's Date: 02/04/2022   History of Present Illness Cathy Spears is a 86 y.o. female with medical history significant for A-fib on Eliquis, HTN, breast cancer, GERD, recent hospitalization from 7/2 to 7/4 for CHF and possible laryngeal spasm with normal flex laryngoscopy, who returns to the ED with a complaint of shortness of breath that started the day prior associated with cough productive of white phlegm    PT Comments    Pt OOB and completes x 2 laps on room air with sats 90% after gait.  Overall progressing well.  She does have some unsteadiness but is able to self recover and does not seem interested in a walker at this time.  Recommendations for follow up therapy are one component of a multi-disciplinary discharge planning process, led by the attending physician.  Recommendations may be updated based on patient status, additional functional criteria and insurance authorization.  Follow Up Recommendations  No PT follow up     Assistance Recommended at Discharge PRN  Patient can return home with the following Assistance with cooking/housework;Assist for transportation;Help with stairs or ramp for entrance   Equipment Recommendations  None recommended by PT    Recommendations for Other Services       Precautions / Restrictions Precautions Precautions: Fall Precaution Comments: watch HR Restrictions Weight Bearing Restrictions: No     Mobility  Bed Mobility Overal bed mobility: Needs Assistance Bed Mobility: Supine to Sit, Sit to Supine     Supine to sit: Supervision Sit to supine: Supervision        Transfers Overall transfer level: Needs assistance Equipment used: None Transfers: Sit to/from Stand Sit to Stand: Supervision                Ambulation/Gait Ambulation/Gait assistance: Min guard, Supervision Gait Distance (Feet): 300 Feet Assistive  device: None Gait Pattern/deviations: Decreased step length - right, Decreased step length - left, Decreased dorsiflexion - right, Decreased dorsiflexion - left, Decreased stride length, Shuffle Gait velocity: decreased         Stairs             Wheelchair Mobility    Modified Rankin (Stroke Patients Only)       Balance Overall balance assessment: Needs assistance Sitting-balance support: Feet supported Sitting balance-Leahy Scale: Good     Standing balance support: No upper extremity supported, During functional activity Standing balance-Leahy Scale: Fair Standing balance comment: some lateral sway left/right but is able to self recover                            Cognition Arousal/Alertness: Awake/alert Behavior During Therapy: WFL for tasks assessed/performed Overall Cognitive Status: Within Functional Limits for tasks assessed                                          Exercises      General Comments        Pertinent Vitals/Pain Pain Assessment Pain Assessment: No/denies pain    Home Living                          Prior Function            PT Goals (current goals can now be found in the  care plan section) Progress towards PT goals: Progressing toward goals    Frequency    Min 2X/week      PT Plan      Co-evaluation              AM-PAC PT "6 Clicks" Mobility   Outcome Measure  Help needed turning from your back to your side while in a flat bed without using bedrails?: None Help needed moving from lying on your back to sitting on the side of a flat bed without using bedrails?: None Help needed moving to and from a bed to a chair (including a wheelchair)?: A Little Help needed standing up from a chair using your arms (e.g., wheelchair or bedside chair)?: A Little Help needed to walk in hospital room?: A Little Help needed climbing 3-5 steps with a railing? : A Little 6 Click Score: 20     End of Session   Activity Tolerance: Patient tolerated treatment well Patient left: in bed;with family/visitor present (in care of OT) Nurse Communication: Mobility status (O2) PT Visit Diagnosis: Muscle weakness (generalized) (M62.81);Other abnormalities of gait and mobility (R26.89)     Time: 8832-5498 PT Time Calculation (min) (ACUTE ONLY): 14 min  Charges:  $Gait Training: 8-22 mins                   Chesley Noon, PTA 02/04/22, 3:15 PM

## 2022-02-04 NOTE — Progress Notes (Signed)
Initial Nutrition Assessment  DOCUMENTATION CODES:   Obesity unspecified  INTERVENTION:   Ensure Enlive po BID, each supplement provides 350 kcal and 20 grams of protein.  MVI po daily  Liberalize diet  Daily weights   Low sodium diet education   NUTRITION DIAGNOSIS:   Increased nutrient needs related to catabolic illness (COPD, CHF) as evidenced by estimated needs.  GOAL:   Patient will meet greater than or equal to 90% of their needs  MONITOR:   PO intake, Supplement acceptance, Labs, Weight trends, Skin, I & O's  REASON FOR ASSESSMENT:   Consult Assessment of nutrition requirement/status  ASSESSMENT:   86 y.o. female with medical history significant for A-fib on Eliquis, HTN, breast cancer s/p lumpectomy, GERD with esophageal stenosis, COPD, hernia repair 2009 and recent hospitalization from 7/2 to 7/4 for CHF and possible laryngeal spasm with normal flex laryngoscopy who returns to the ED with a complaint of shortness of breath.  Met with pt and pt's daughter in room today. Pt is able to speak some English but daughter was used to help interpret. Pt reports good appetite and oral intake at baseline but reports decreased oral intake in hospital r/t shortness of breath. Pt reports that her and her husband both have a virus and have been having respiratory issues and decreased oral intake. Pt reports that she has been eating fair in hospital and she feels her oral intake is improving. Pt reports that the hospital food is different from what she eats at home as they generally eat home cooked meals and food from the garden. RD discussed with pt the importance of adequate intake needed to preserve lean muscle. Pt is willing to try strawberry supplements in hospital. RD will add supplements and liberalize pt's diet to help her meet her estimated needs. RD provided pt with low sodium diet education today. Pt reports that she has eliminated all salt from her diet and recent  purchased a nice, new scale that she weighs on daily. Pt reports that her UBW is 183-185lbs but reports that she weighed 171lbs prior to her last discharge. Per chart, pt is down 6lbs since admission likely r/t fluid changes.   Medications reviewed and include: azithromycin, lasix, meatonin, solu-medrol, MVI, protonix, KCl  Labs reviewed: K 4.1 wnl, BUN 28(H), Mg 2.1 wnl  NUTRITION - FOCUSED PHYSICAL EXAM:  Flowsheet Row Most Recent Value  Orbital Region No depletion  Upper Arm Region No depletion  Thoracic and Lumbar Region No depletion  Buccal Region No depletion  Temple Region No depletion  Clavicle Bone Region Mild depletion  Clavicle and Acromion Bone Region Mild depletion  Scapular Bone Region No depletion  Dorsal Hand Mild depletion  Patellar Region No depletion  Anterior Thigh Region No depletion  Posterior Calf Region No depletion  Edema (RD Assessment) Mild  Hair Reviewed  Eyes Reviewed  Mouth Reviewed  Skin Reviewed  Nails Reviewed   Diet Order:   Diet Order             Diet Heart Room service appropriate? Yes; Fluid consistency: Thin  Diet effective now                  EDUCATION NEEDS:   Education needs have been addressed  Skin:  Skin Assessment: Reviewed RN Assessment  Last BM:  7/9  Height:   Ht Readings from Last 1 Encounters:  02/01/22 5' 2" (1.575 m)    Weight:   Wt Readings from Last 1 Encounters:  02/04/22 80.6 kg    Ideal Body Weight:  50 kg  BMI:  Body mass index is 32.52 kg/m.  Estimated Nutritional Needs:   Kcal:  1700-1900kcal/day  Protein:  85-95g/day  Fluid:  1.3-1.5L/day  Koleen Distance MS, RD, LDN Please refer to [facility] for RD and/or RD on-call/weekend/after hours pager

## 2022-02-04 NOTE — Consult Note (Signed)
PULMONOLOGY         Date: 02/04/2022,   MRN# [id number] Cathy Spears 1936/01/01     AdmissionWeight: 83.4 kg                 CurrentWeight: 83.4 kg  Referring provider: Dr Tilden Dome   CHIEF COMPLAINT:   Acute exacerbation of COPD with hypoxemia   HISTORY OF PRESENT ILLNESS   This is an 86yo F with hx of AF on chronic AC, HTN breast CA, GERD, s/p dc earlier this month with AECOPD and forceful cough. She had CTPE done on arrival with findings of subsegmental atelectasis and bronchitic changes. RVP was done with findings of acute Parainflenza infection.    PAST MEDICAL HISTORY   Past Medical History:  Diagnosis Date   Anxiety    Breast cancer ([place])    Cancer ([place])    ,bilateral lymph nodes removed  breast mostly from the left   Cataracts, bilateral    Complication of anesthesia    Dysrhythmia    hx of AF   Esophageal erosions    GERD (gastroesophageal reflux disease)    Hypertension    dr Saralyn Pilar  at [facility]   Personal history of radiation therapy    PONV (postoperative nausea and vomiting)    spinal anesthesia x1     SURGICAL HISTORY   Past Surgical History:  Procedure Laterality Date   BALLOON DILATION N/A 09/24/2017   Procedure: BALLOON DILATION;  Surgeon: Toledo, Benay Pike, MD;  Location: [facility] ENDOSCOPY;  Service: Gastroenterology;  Laterality: N/A;   BREAST BIOPSY Right    BREAST LUMPECTOMY Bilateral    2010   BREAST SURGERY     lumpectomy bil,pt. states lymph nodes were removed and  the left arm is restricted   COLONOSCOPY WITH ESOPHAGOGASTRODUODENOSCOPY (EGD)     ESOPHAGOGASTRODUODENOSCOPY (EGD) WITH PROPOFOL N/A 09/24/2017   Procedure: ESOPHAGOGASTRODUODENOSCOPY (EGD) WITH PROPOFOL;  Surgeon: Toledo, Benay Pike, MD;  Location: [facility] ENDOSCOPY;  Service: Gastroenterology;  Laterality: N/A;   HERNIA REPAIR     LUMBAR LAMINECTOMY/DECOMPRESSION MICRODISCECTOMY  08/22/2011   Procedure: LUMBAR LAMINECTOMY/DECOMPRESSION MICRODISCECTOMY;   Surgeon: Ophelia Charter, MD;  Location: [facility];  Service: Neurosurgery;  Laterality: Right;  RIGHT Lumbar five sacral one  laminectomy and microdiscectomy   TOTAL KNEE ARTHROPLASTY Right 01/14/2019   Procedure: TOTAL KNEE ARTHROPLASTY RIGHT;  Surgeon: Corky Mull, MD;  Location: [facility] ORS;  Service: Orthopedics;  Laterality: Right;     FAMILY HISTORY   History reviewed. No pertinent family history.   SOCIAL HISTORY   Social History   Tobacco Use   Smoking status: Never   Smokeless tobacco: Never  Vaping Use   Vaping Use: Never used  Substance Use Topics   Alcohol use: No   Drug use: No     MEDICATIONS    Home Medication:    Current Medication:  Current Facility-Administered Medications:    acetaminophen (TYLENOL) tablet 650 mg, 650 mg, Oral, Q4H PRN, Athena Masse, MD   albuterol (VENTOLIN HFA) 108 (90 Base) MCG/ACT inhaler 1-2 puff, 1-2 puff, Inhalation, Q4H PRN, Darrick Penna, RPH   ALPRAZolam Duanne Moron) tablet 0.25 mg, 0.25 mg, Oral, BID PRN, Foust, Katy L, NP, 0.25 mg at 02/04/22 1137   apixaban (ELIQUIS) tablet 5 mg, 5 mg, Oral, BID, Judd Gaudier V, MD, 5 mg at 02/04/22 0939   azithromycin (ZITHROMAX) tablet 500 mg, 500 mg, Oral, Daily, Oswald Hillock, RPH, 500 mg at 02/04/22 [id number]  diltiazem (CARDIZEM CD) 24 hr capsule 120 mg, 120 mg, Oral, Daily, Sharen Hones, MD, 120 mg at 02/04/22 3419   furosemide (LASIX) tablet 40 mg, 40 mg, Oral, Daily, Athena Masse, MD, 40 mg at 02/04/22 0938   guaiFENesin (MUCINEX) 12 hr tablet 1,200 mg, 1,200 mg, Oral, BID, Sharen Hones, MD, 1,200 mg at 02/04/22 0950   guaiFENesin-dextromethorphan (ROBITUSSIN DM) 100-10 MG/5ML syrup 5 mL, 5 mL, Oral, Q4H PRN, Sharen Hones, MD   melatonin tablet 2.5 mg, 2.5 mg, Oral, QHS, Sharen Hones, MD, 2.5 mg at 02/03/22 2025   methylPREDNISolone sodium succinate (SOLU-MEDROL) 40 mg/mL injection 40 mg, 40 mg, Intravenous, Q12H, Sharen Hones, MD, 40 mg at 02/04/22 0940   metoprolol  tartrate (LOPRESSOR) tablet 50 mg, 50 mg, Oral, BID, Athena Masse, MD, 50 mg at 02/04/22 3790   multivitamin-lutein (OCUVITE-LUTEIN) capsule 1 capsule, 1 capsule, Oral, BID, Athena Masse, MD, 1 capsule at 02/03/22 1220   ondansetron (ZOFRAN) injection 4 mg, 4 mg, Intravenous, Q6H PRN, Athena Masse, MD   pantoprazole (PROTONIX) EC tablet 40 mg, 40 mg, Oral, Daily, Judd Gaudier V, MD, 40 mg at 02/04/22 0805   potassium chloride SA (KLOR-CON M) CR tablet 20 mEq, 20 mEq, Oral, Daily, Athena Masse, MD, 20 mEq at 02/04/22 0938   tiotropium [facility]) inhalation capsule ([facility] use ONLY) 18 mcg, 18 mcg, Inhalation, Daily, Sharen Hones, MD, 18 mcg at 02/03/22 1550    ALLERGIES   Fish-derived products and Oxycodone     REVIEW OF SYSTEMS    Review of Systems:  Gen:  Denies  fever, sweats, chills weigh loss  HEENT: Denies blurred vision, double vision, ear pain, eye pain, hearing loss, nose bleeds, sore throat Cardiac:  No dizziness, chest pain or heaviness, chest tightness,edema Resp:   reports dyspnea chronically  Gi: Denies swallowing difficulty, stomach pain, nausea or vomiting, diarrhea, constipation, bowel incontinence Gu:  Denies bladder incontinence, burning urine Ext:   Denies Joint pain, stiffness or swelling Skin: Denies  skin rash, easy bruising or bleeding or hives Endoc:  Denies polyuria, polydipsia , polyphagia or weight change Psych:   Denies depression, insomnia or hallucinations   Other:  All other systems negative   VS: BP 119/63 (BP Location: Right Arm)   Pulse 94   Temp 97.8 F (36.6 C)   Resp 19   Ht '5\' 2"'$  (1.575 m)   Wt 83.4 kg   SpO2 100%   BMI 33.63 kg/m      PHYSICAL EXAM    GENERAL:NAD, no fevers, chills, no weakness no fatigue HEAD: Normocephalic, atraumatic.  EYES: Pupils equal, round, reactive to light. Extraocular muscles intact. No scleral icterus.  MOUTH: Moist mucosal membrane. Dentition intact. No abscess noted.  EAR, NOSE,  THROAT: Clear without exudates. No external lesions.  NECK: Supple. No thyromegaly. No nodules. No JVD.  PULMONARY: decreased breath sounds with mild rhonchi worse at bases bilaterally.  CARDIOVASCULAR: S1 and S2. Regular rate and rhythm. No murmurs, rubs, or gallops. No edema. Pedal pulses 2+ bilaterally.  GASTROINTESTINAL: Soft, nontender, nondistended. No masses. Positive bowel sounds. No hepatosplenomegaly.  MUSCULOSKELETAL: No swelling, clubbing, or edema. Range of motion full in all extremities.  NEUROLOGIC: Cranial nerves II through XII are intact. No gross focal neurological deficits. Sensation intact. Reflexes intact.  SKIN: No ulceration, lesions, rashes, or cyanosis. Skin warm and dry. Turgor intact.  PSYCHIATRIC: Mood, affect within normal limits. The patient is awake, alert and oriented x 3. Insight, judgment intact.  IMAGING     ASSESSMENT/PLAN   Severe acute exacerbation of COPD      -Due to parainfluenza infection       - curently on 2L/min [place]      - Currently on zithromax and soluedrol '40mg'$  IV bid     - continue supportive care with incentive spirometry and flutter valve   Chronic pulmonary hypertension       -unclear how she is being treated for this will follow up on outpatient   Atrial fibrillation     On eliquis         Thank you for allowing me to participate in the care of this patient.   Patient/Family are satisfied with care plan and all questions have been answered.    Provider disclosure: Patient with at least one acute or chronic illness or injury that poses a threat to life or bodily function and is being managed actively during this encounter.  All of the below services have been performed independently by signing provider:  review of prior documentation from internal and or external health records.  Review of previous and current lab results.  Interview and comprehensive assessment during patient visit today. Review of current and  previous chest radiographs/CT scans. Discussion of management and test interpretation with health care team and patient/family.   This document was prepared using Dragon voice recognition software and may include unintentional dictation errors.     Ottie Glazier, M.D.  Division of Pulmonary & Critical Care Medicine

## 2022-02-05 DIAGNOSIS — I480 Paroxysmal atrial fibrillation: Secondary | ICD-10-CM | POA: Diagnosis not present

## 2022-02-05 DIAGNOSIS — B348 Other viral infections of unspecified site: Secondary | ICD-10-CM | POA: Diagnosis not present

## 2022-02-05 DIAGNOSIS — J441 Chronic obstructive pulmonary disease with (acute) exacerbation: Secondary | ICD-10-CM | POA: Diagnosis not present

## 2022-02-05 NOTE — Progress Notes (Addendum)
PULMONOLOGY         Date: 02/05/2022,   MRN# [id number] Cathy Spears 21-Oct-1935     AdmissionWeight: 83.4 kg                 CurrentWeight: 79.6 kg  Referring provider: Dr Tilden Dome   CHIEF COMPLAINT:   Acute exacerbation of COPD with hypoxemia   HISTORY OF PRESENT ILLNESS   This is an 86yo F with hx of AF on chronic AC, HTN breast CA, GERD, s/p dc earlier this month with AECOPD and forceful cough. She had CTPE done on arrival with findings of subsegmental atelectasis and bronchitic changes. RVP was done with findings of acute Parainflenza infection.   -02/05/22- patient is stable and breathing well sometimes taking off O2 therapy completely with normoxia on room air. She is negative 3.3L fluid balance. Patient is getting close to baseline and I recommend DC planning. We can follow up with her outpatient in pulmonology clinic.   PAST MEDICAL HISTORY   Past Medical History:  Diagnosis Date   Anxiety    Breast cancer ([place])    Cancer ([place])    ,bilateral lymph nodes removed  breast mostly from the left   Cataracts, bilateral    Complication of anesthesia    Dysrhythmia    hx of AF   Esophageal erosions    GERD (gastroesophageal reflux disease)    Hypertension    dr Saralyn Pilar  at [facility] clinic   Personal history of radiation therapy    PONV (postoperative nausea and vomiting)    spinal anesthesia x1     SURGICAL HISTORY   Past Surgical History:  Procedure Laterality Date   BALLOON DILATION N/A 09/24/2017   Procedure: BALLOON DILATION;  Surgeon: Toledo, Benay Pike, MD;  Location: [facility] ENDOSCOPY;  Service: Gastroenterology;  Laterality: N/A;   BREAST BIOPSY Right    BREAST LUMPECTOMY Bilateral    2010   BREAST SURGERY     lumpectomy bil,pt. states lymph nodes were removed and  the left arm is restricted   COLONOSCOPY WITH ESOPHAGOGASTRODUODENOSCOPY (EGD)     ESOPHAGOGASTRODUODENOSCOPY (EGD) WITH PROPOFOL N/A 09/24/2017   Procedure:  ESOPHAGOGASTRODUODENOSCOPY (EGD) WITH PROPOFOL;  Surgeon: Toledo, Benay Pike, MD;  Location: [facility] ENDOSCOPY;  Service: Gastroenterology;  Laterality: N/A;   HERNIA REPAIR     LUMBAR LAMINECTOMY/DECOMPRESSION MICRODISCECTOMY  08/22/2011   Procedure: LUMBAR LAMINECTOMY/DECOMPRESSION MICRODISCECTOMY;  Surgeon: Ophelia Charter, MD;  Location: [place] [facility];  Service: Neurosurgery;  Laterality: Right;  RIGHT Lumbar five sacral one  laminectomy and microdiscectomy   TOTAL KNEE ARTHROPLASTY Right 01/14/2019   Procedure: TOTAL KNEE ARTHROPLASTY RIGHT;  Surgeon: Corky Mull, MD;  Location: [facility] ORS;  Service: Orthopedics;  Laterality: Right;     FAMILY HISTORY   History reviewed. No pertinent family history.   SOCIAL HISTORY   Social History   Tobacco Use   Smoking status: Never   Smokeless tobacco: Never  Vaping Use   Vaping Use: Never used  Substance Use Topics   Alcohol use: No   Drug use: No     MEDICATIONS    Home Medication:    Current Medication:  Current Facility-Administered Medications:    acetaminophen (TYLENOL) tablet 650 mg, 650 mg, Oral, Q4H PRN, Athena Masse, MD   albuterol (VENTOLIN HFA) 108 (90 Base) MCG/ACT inhaler 1-2 puff, 1-2 puff, Inhalation, Q4H PRN, Darrick Penna, RPH   ALPRAZolam Duanne Moron) tablet 0.25 mg, 0.25 mg, Oral, BID PRN, Foust, Katy L,  NP, 0.25 mg at 02/04/22 2207   apixaban (ELIQUIS) tablet 5 mg, 5 mg, Oral, BID, Athena Masse, MD, 5 mg at 02/04/22 2207   azithromycin [facility]) tablet 500 mg, 500 mg, Oral, Daily, Oswald Hillock, RPH, 500 mg at 02/04/22 1025   diltiazem (CARDIZEM CD) 24 hr capsule 120 mg, 120 mg, Oral, Daily, Sharen Hones, MD, 120 mg at 02/04/22 8527   feeding supplement (ENSURE ENLIVE / ENSURE PLUS) liquid 237 mL, 237 mL, Oral, BID BM, Sharen Hones, MD   furosemide (LASIX) tablet 40 mg, 40 mg, Oral, Daily, Judd Gaudier V, MD, 40 mg at 02/04/22 7824   guaiFENesin (MUCINEX) 12 hr tablet 1,200 mg, 1,200 mg, Oral, BID,  Sharen Hones, MD, 1,200 mg at 02/04/22 2158   guaiFENesin-dextromethorphan (ROBITUSSIN DM) 100-10 MG/5ML syrup 5 mL, 5 mL, Oral, Q4H PRN, Sharen Hones, MD   melatonin tablet 2.5 mg, 2.5 mg, Oral, QHS, Sharen Hones, MD, 2.5 mg at 02/04/22 2207   methylPREDNISolone sodium succinate (SOLU-MEDROL) 40 mg/mL injection 40 mg, 40 mg, Intravenous, Q12H, Sharen Hones, MD, 40 mg at 02/04/22 2203   metoprolol tartrate (LOPRESSOR) tablet 50 mg, 50 mg, Oral, BID, Athena Masse, MD, 50 mg at 02/04/22 2207   multivitamin with minerals tablet 1 tablet, 1 tablet, Oral, Daily, Sharen Hones, MD   multivitamin-lutein (OCUVITE-LUTEIN) capsule 1 capsule, 1 capsule, Oral, BID, Athena Masse, MD, 1 capsule at 02/04/22 2207   ondansetron (ZOFRAN) injection 4 mg, 4 mg, Intravenous, Q6H PRN, Athena Masse, MD   pantoprazole (PROTONIX) EC tablet 40 mg, 40 mg, Oral, Daily, Judd Gaudier V, MD, 40 mg at 02/05/22 0800   potassium chloride SA (KLOR-CON M) CR tablet 20 mEq, 20 mEq, Oral, Daily, Athena Masse, MD, 20 mEq at 02/04/22 0938   tiotropium [facility]) inhalation capsule ([facility] use ONLY) 18 mcg, 18 mcg, Inhalation, Daily, Sharen Hones, MD, 18 mcg at 02/03/22 1550    ALLERGIES   Fish-derived products and Oxycodone     REVIEW OF SYSTEMS    Review of Systems:  Gen:  Denies  fever, sweats, chills weigh loss  HEENT: Denies blurred vision, double vision, ear pain, eye pain, hearing loss, nose bleeds, sore throat Cardiac:  No dizziness, chest pain or heaviness, chest tightness,edema Resp:   reports dyspnea chronically  Gi: Denies swallowing difficulty, stomach pain, nausea or vomiting, diarrhea, constipation, bowel incontinence Gu:  Denies bladder incontinence, burning urine Ext:   Denies Joint pain, stiffness or swelling Skin: Denies  skin rash, easy bruising or bleeding or hives Endoc:  Denies polyuria, polydipsia , polyphagia or weight change Psych:   Denies depression, insomnia or hallucinations    Other:  All other systems negative   VS: BP 108/63 (BP Location: Right Arm)   Pulse (!) 104   Temp 97.8 F (36.6 C)   Resp 16   Ht '5\' 2"'$  (1.575 m)   Wt 79.6 kg   SpO2 98%   BMI 32.10 kg/m      PHYSICAL EXAM    GENERAL:NAD, no fevers, chills, no weakness no fatigue HEAD: Normocephalic, atraumatic.  EYES: Pupils equal, round, reactive to light. Extraocular muscles intact. No scleral icterus.  MOUTH: Moist mucosal membrane. Dentition intact. No abscess noted.  EAR, NOSE, THROAT: Clear without exudates. No external lesions.  NECK: Supple. No thyromegaly. No nodules. No JVD.  PULMONARY: decreased breath sounds with mild rhonchi worse at bases bilaterally.  CARDIOVASCULAR: S1 and S2. Regular rate and rhythm. No murmurs, rubs, or gallops. No  edema. Pedal pulses 2+ bilaterally.  GASTROINTESTINAL: Soft, nontender, nondistended. No masses. Positive bowel sounds. No hepatosplenomegaly.  MUSCULOSKELETAL: No swelling, clubbing, or edema. Range of motion full in all extremities.  NEUROLOGIC: Cranial nerves II through XII are intact. No gross focal neurological deficits. Sensation intact. Reflexes intact.  SKIN: No ulceration, lesions, rashes, or cyanosis. Skin warm and dry. Turgor intact.  PSYCHIATRIC: Mood, affect within normal limits. The patient is awake, alert and oriented x 3. Insight, judgment intact.       IMAGING     ASSESSMENT/PLAN   Severe acute exacerbation of COPD      -Due to parainfluenza infection       - curently on 2L/min [place]      - Currently on zithromax and soluedrol '40mg'$  IV bid     - continue supportive care with incentive spirometry and flutter valve   Chronic pulmonary hypertension       -unclear how she is being treated for this will follow up on outpatient   Atrial fibrillation     On eliquis         Thank you for allowing me to participate in the care of this patient.   Patient/Family are satisfied with care plan and all questions have  been answered.    Provider disclosure: Patient with at least one acute or chronic illness or injury that poses a threat to life or bodily function and is being managed actively during this encounter.  All of the below services have been performed independently by signing provider:  review of prior documentation from internal and or external health records.  Review of previous and current lab results.  Interview and comprehensive assessment during patient visit today. Review of current and previous chest radiographs/CT scans. Discussion of management and test interpretation with health care team and patient/family.   This document was prepared using Dragon voice recognition software and may include unintentional dictation errors.     Ottie Glazier, M.D.  Division of Pulmonary & Critical Care Medicine

## 2022-02-05 NOTE — Progress Notes (Signed)
Occupational Therapy Treatment Patient Details Name: Cathy Spears MRN: [id number] DOB: 09-19-1935 Today's Date: 02/05/2022   History of present illness Cathy Spears is a 86 y.o. female with medical history significant for A-fib on Eliquis, HTN, breast cancer, GERD, recent hospitalization from 7/2 to 7/4 for CHF and possible laryngeal spasm with normal flex laryngoscopy, who returns to the ED with a complaint of shortness of breath that started the day prior associated with cough productive of white phlegm   OT comments  [name] presents with generalized weakness, limited endurance, cough, and SOB. During today's session, she is near her baseline level of fxl mobility, able to perform grooming, toileting, transferring, ambulation all without physical assistance, no AD, with SUPV for safety. Pt has coughing spasms throughout session which she describes as uncomfortable and anxiety-producing. Provided educ re: breathing techniques, activity modification, energy conservation, and relaxation/anxiety mgmt techniques. Pt and daughter verbalize understanding, provide teach back. Pt could benefit from additional reinforcement of these techniques, but anticipate that she will not need further OT services post DC.   Recommendations for follow up therapy are one component of a multi-disciplinary discharge planning process, led by the attending physician.  Recommendations may be updated based on patient status, additional functional criteria and insurance authorization.    Follow Up Recommendations  No OT follow up    Assistance Recommended at Discharge Intermittent Supervision/Assistance  Patient can return home with the following  A little help with bathing/dressing/bathroom;Assistance with cooking/housework;Assist for transportation   Equipment Recommendations  None recommended by OT    Recommendations for Other Services      Precautions / Restrictions Precautions Precautions:  Fall Precaution Comments: Mod fall risk, watch HR Restrictions Weight Bearing Restrictions: No       Mobility Bed Mobility               General bed mobility comments: pt received, left in recliner    Transfers Overall transfer level: Needs assistance Equipment used: None Transfers: Sit to/from Stand, Bed to chair/wheelchair/BSC Sit to Stand: Supervision     Step pivot transfers: Supervision           Balance Overall balance assessment: Needs assistance Sitting-balance support: Feet supported Sitting balance-Leahy Scale: Good     Standing balance support: No upper extremity supported, During functional activity Standing balance-Leahy Scale: Good Standing balance comment: able to walk within room, bathroom, around nursing station, with some lateral sway but no significant LOB, no AD                           ADL either performed or assessed with clinical judgement   ADL Overall ADL's : Needs assistance/impaired     Grooming: Wash/dry hands;Wash/dry face;Oral care;Standing;Supervision/safety           Upper Body Dressing : Supervision/safety   Lower Body Dressing: Supervision/safety;Sit to/from stand   Toilet Transfer: Sales executive;Ambulation   Toileting- Clothing Manipulation and Hygiene: Supervision/safety;Sitting/lateral lean       Functional mobility during ADLs: Supervision/safety      Extremity/Trunk Assessment Upper Extremity Assessment Upper Extremity Assessment: Overall WFL for tasks assessed   Lower Extremity Assessment Lower Extremity Assessment: Overall WFL for tasks assessed        Vision       Perception     Praxis      Cognition Arousal/Alertness: Awake/alert Behavior During Therapy: WFL for tasks assessed/performed, Anxious Overall Cognitive Status: Within Functional Limits for tasks assessed  Exercises Other Exercises Other  Exercises: Educ re: energy conservation, activity modification, relaxation and anxiety mgmt techniques    Shoulder Instructions       General Comments SpO2s in min-upper 90s, HR up to 110 with ambulation    Pertinent Vitals/ Pain       Pain Assessment Pain Assessment: No/denies pain  Home Living                                          Prior Functioning/Environment              Frequency  Min 2X/week        Progress Toward Goals  OT Goals(current goals can now be found in the care plan section)  Progress towards OT goals: Progressing toward goals  Acute Rehab OT Goals OT Goal Formulation: With patient Time For Goal Achievement: 02/16/22 Potential to Achieve Goals: Good  Plan Discharge plan remains appropriate;Frequency remains appropriate    Co-evaluation                 AM-PAC OT "6 Clicks" Daily Activity     Outcome Measure   Help from another person eating meals?: None Help from another person taking care of personal grooming?: None Help from another person toileting, which includes using toliet, bedpan, or urinal?: A Little Help from another person bathing (including washing, rinsing, drying)?: A Little Help from another person to put on and taking off regular upper body clothing?: None Help from another person to put on and taking off regular lower body clothing?: A Little 6 Click Score: 21    End of Session    OT Visit Diagnosis: Unsteadiness on feet (R26.81)   Activity Tolerance Patient tolerated treatment well   Patient Left in chair;with call bell/phone within reach;with family/visitor present   Nurse Communication          Time: 0950-1003 OT Time Calculation (min): 13 min  Charges: OT General Charges $OT Visit: 1 Visit OT Treatments $Self Care/Home Management : 8-22 mins Josiah Lobo, PhD, MS, OTR/L 02/05/22, 10:15 AM

## 2022-02-05 NOTE — TOC Initial Note (Signed)
Transition of Care [facility]) - Initial/Assessment Note    Patient Details  Name: Cathy Spears MRN: [id number] Date of Birth: [birth year]  [facility] CM/SW Contact:    Pete Pelt, RN Phone Number: 02/05/2022, 10:21 AM  Clinical Narrative:  Patient lives at home with spouse, has the assistance of both daughters.  Daughter drives patient to appointments, and she is current with her PCP of Dr. Sabra Heck.  Patient and spouse obtain medications from the local pharmacy and daughter assists with assembling pill box.  Patient does not have DME or home health, does not feel these services are necessary at this time, PT has not recommended a follow up for patient.  Daughter verbalizes no needs, TOC contact information provided in the event of changes, questions or concerns.                 Expected Discharge Plan: Home/Self Care Barriers to Discharge: Continued Medical Work up   Patient Goals and CMS Choice        Expected Discharge Plan and Services Expected Discharge Plan: Home/Self Care   Discharge Planning Services: CM Consult   Living arrangements for the past 2 months: Single Family Home                                      Prior Living Arrangements/Services Living arrangements for the past 2 months: Single Family Home Lives with:: Self, Spouse Patient language and need for interpreter reviewed:: Yes Do you feel safe going back to the place where you live?: Yes      Need for Family Participation in Patient Care: Yes (Comment) Care giver support system in place?: Yes (comment)   Criminal Activity/Legal Involvement Pertinent to Current Situation/Hospitalization: No - Comment as needed  Activities of Daily Living      Permission Sought/Granted Permission sought to share information with : Case Manager Permission granted to share information with : Yes, Verbal Permission Granted              Emotional Assessment Appearance:: Appears stated  age Attitude/Demeanor/Rapport: Engaged Affect (typically observed): Pleasant Orientation: : Oriented to Self, Oriented to Place, Oriented to  Time, Oriented to Situation Alcohol / Substance Use: Not Applicable Psych Involvement: No (comment)  Admission diagnosis:  Shortness of breath [R06.02] COPD exacerbation (HCC) [J44.1] Atrial fibrillation with RVR ([place]) [I48.91] Paroxysmal atrial fibrillation with rapid ventricular response (HCC) [I48.0] Patient Active Problem List   Diagnosis Date Noted   Parainfluenza infection 02/04/2022   COPD exacerbation ([place]) 02/03/2022   Chronic diastolic CHF (congestive heart failure) ([place]) 02/02/2022   COPD with acute exacerbation ([place]) 02/02/2022   Hyponatremia 02/02/2022   Anxiety    CHF (congestive heart failure) ([place]) 01/28/2022   Acute CHF (congestive heart failure) ([place]) 01/27/2022   Laryngeal spasm 01/27/2022   Paroxysmal atrial fibrillation with rapid ventricular response ([place]) 01/27/2022   GERD without esophagitis 01/27/2022   Status post total knee replacement using cement, right 01/14/2019   PCP:  Rusty Aus, MD Pharmacy:   [id number] [place] [place] [address] [id number]: [phone]: [phone]    Social Determinants of Health (SDOH) Interventions    Readmission Risk Interventions    02/05/2022   10:20 AM  Readmission Risk Prevention Plan  Transportation Screening Complete  PCP or Specialist Appt within 5-7 Days Complete  Home  Care Screening Complete  Medication Review (RN CM) Complete

## 2022-02-05 NOTE — Progress Notes (Signed)
Patient walking in hallway and reports no distress.  Telemetry reports tachycardia of 140-150s.  Patient returned to room and HR 108 on recheck.  Patient denies any issue.

## 2022-02-05 NOTE — Progress Notes (Signed)
  Progress Note   Patient: Cathy Spears DPO:[id number] DOB: 1936/06/21 DOA: 02/02/2022     2 DOS: the patient was seen and examined on 02/05/2022   Brief hospital course: Cathy Spears is a 86 y.o. female with medical history significant for A-fib on Eliquis, HTN, breast cancer, GERD, recent hospitalization from 7/2 to 7/4 for CHF and possible laryngeal spasm with normal flex laryngoscopy, who returns to the ED with a complaint of shortness of breath that started the day prior associated with cough productive of white phlegm.  Upon arriving the hospital, patient was found to have paroxysmal atrial fibrillation with rapid ventricular response, started on diltiazem drip, continued on apixaban. Patient was also placed on steroids for COPD exacerbation. CT scan ruled out PE.  Assessment and Plan: Paroxysmal atrial fibrillation with rapid ventricular response. Heart rate better controlled, continue anticoagulation.  COPD exacerbation. Parainfluenza infection. Pulm hypertension. Condition improving, however, patient still has significant cough, making her short of breath at the time of cough.  She is not tolerating any inhalers, she is finally able to tolerate IV steroids.  We will continue.  Also on Zithromax.   Chronic diastolic congestive heart failure. Stable      Subjective:  Patient still complaining of cough, but appears to be improving.  Every time she coughs, she feels she cannot breathe.  Physical Exam: Vitals:   02/04/22 2049 02/05/22 0440 02/05/22 0444 02/05/22 0834  BP: 122/67 106/67  108/63  Pulse: 94 75  (!) 104  Resp: '16 16  16  '$ Temp: 97.7 F (36.5 C) 97.7 F (36.5 C)  97.8 F (36.6 C)  TempSrc: Oral     SpO2: 95% 96%  98%  Weight:   79.6 kg   Height:       General exam: Appears calm and comfortable  Respiratory system: Decreased breathing sounds. respiratory effort normal. Cardiovascular system: S1 & S2 heard, RRR. No JVD, murmurs, rubs, gallops or  clicks. No pedal edema. Gastrointestinal system: Abdomen is nondistended, soft and nontender. No organomegaly or masses felt. Normal bowel sounds heard. Central nervous system: Alert and oriented. No focal neurological deficits. Extremities: Symmetric 5 x 5 power. Skin: No rashes, lesions or ulcers Psychiatry: Judgement and insight appear normal. Mood & affect appropriate.   Data Reviewed:  There are no new results to review at this time.  Family Communication: Daughter updated at bedside  Disposition: Status is: Inpatient Remains inpatient appropriate because: Severity of disease, IV steroids  Planned Discharge Destination: Home    Time spent: 35 minutes  Author: Sharen Hones, MD 02/05/2022 11:43 AM  For on call review [email or web address].

## 2022-02-05 NOTE — Progress Notes (Signed)
Physical Therapy Treatment Patient Details Name: Cathy Spears MRN: [id number] DOB: 03/04/1936 Today's Date: 02/05/2022   History of Present Illness Cathy Spears is a 86 y.o. female with medical history significant for A-fib on Eliquis, HTN, breast cancer, GERD, recent hospitalization from 7/2 to 7/4 for CHF and possible laryngeal spasm with normal flex laryngoscopy, who returns to the ED with a complaint of shortness of breath that started the day prior associated with cough productive of white phlegm    PT Comments    Completes x 2 laps.  Initially reaches and holds my hand but when questioned she stated she did not need it and completes the rest without assist.    Recommendations for follow up therapy are one component of a multi-disciplinary discharge planning process, led by the attending physician.  Recommendations may be updated based on patient status, additional functional criteria and insurance authorization.  Follow Up Recommendations  No PT follow up     Assistance Recommended at Discharge PRN  Patient can return home with the following Assistance with cooking/housework;Assist for transportation;Help with stairs or ramp for entrance   Equipment Recommendations  None recommended by PT    Recommendations for Other Services       Precautions / Restrictions Precautions Precautions: Fall Precaution Comments: Mod fall risk, watch HR Restrictions Weight Bearing Restrictions: No     Mobility  Bed Mobility               General bed mobility comments: pt received, left in recliner    Transfers Overall transfer level: Needs assistance Equipment used: None Transfers: Sit to/from Stand Sit to Stand: Supervision                Ambulation/Gait Ambulation/Gait assistance: Min guard, Supervision Gait Distance (Feet): 350 Feet Assistive device: None, 1 person hand held assist   Gait velocity: decreased     General Gait Details: unsteady at times  but able to correct all imbalances.  does not want to try [facility] or RW   Stairs             Wheelchair Mobility    Modified Rankin (Stroke Patients Only)       Balance Overall balance assessment: Needs assistance Sitting-balance support: Feet supported Sitting balance-Leahy Scale: Good     Standing balance support: No upper extremity supported, During functional activity Standing balance-Leahy Scale: Good                              Cognition Arousal/Alertness: Awake/alert Behavior During Therapy: WFL for tasks assessed/performed Overall Cognitive Status: Within Functional Limits for tasks assessed                                          Exercises      General Comments General comments (skin integrity, edema, etc.): SpO2s in min-upper 90s, HR up to 110 with ambulation      Pertinent Vitals/Pain Pain Assessment Pain Assessment: No/denies pain    Home Living                          Prior Function            PT Goals (current goals can now be found in the care plan section) Progress towards PT goals: Progressing toward goals  Frequency    Min 2X/week      PT Plan Current plan remains appropriate    Co-evaluation              AM-PAC PT "6 Clicks" Mobility   Outcome Measure  Help needed turning from your back to your side while in a flat bed without using bedrails?: None Help needed moving from lying on your back to sitting on the side of a flat bed without using bedrails?: None Help needed moving to and from a bed to a chair (including a wheelchair)?: None Help needed standing up from a chair using your arms (e.g., wheelchair or bedside chair)?: None Help needed to walk in hospital room?: A Little Help needed climbing 3-5 steps with a railing? : A Little 6 Click Score: 22    End of Session   Activity Tolerance: Patient tolerated treatment well Patient left: in bed;with family/visitor present  (in care of OT) Nurse Communication: Mobility status (O2) PT Visit Diagnosis: Muscle weakness (generalized) (M62.81);Other abnormalities of gait and mobility (R26.89)     Time: 2957-4734 PT Time Calculation (min) (ACUTE ONLY): 8 min  Charges:  $Gait Training: 8-22 mins                   Chesley Noon, PTA 02/05/22, 12:39 PM

## 2022-02-06 ENCOUNTER — Inpatient Hospital Stay: Payer: Medicare HMO

## 2022-02-06 DIAGNOSIS — I5032 Chronic diastolic (congestive) heart failure: Secondary | ICD-10-CM | POA: Diagnosis not present

## 2022-02-06 DIAGNOSIS — E669 Obesity, unspecified: Secondary | ICD-10-CM

## 2022-02-06 DIAGNOSIS — J441 Chronic obstructive pulmonary disease with (acute) exacerbation: Secondary | ICD-10-CM | POA: Diagnosis not present

## 2022-02-06 DIAGNOSIS — I480 Paroxysmal atrial fibrillation: Secondary | ICD-10-CM | POA: Diagnosis not present

## 2022-02-06 LAB — CBC
HCT: 43.2 % (ref 36.0–46.0)
Hemoglobin: 13.9 g/dL (ref 12.0–15.0)
MCH: 26 pg (ref 26.0–34.0)
MCHC: 32.2 g/dL (ref 30.0–36.0)
MCV: 80.7 fL (ref 80.0–100.0)
Platelets: 320 10*3/uL (ref 150–400)
RBC: 5.35 MIL/uL — ABNORMAL HIGH (ref 3.87–5.11)
RDW: 15.2 % (ref 11.5–15.5)
WBC: 9.5 10*3/uL (ref 4.0–10.5)
nRBC: 0 % (ref 0.0–0.2)

## 2022-02-06 MED ORDER — ALBUTEROL SULFATE HFA 108 (90 BASE) MCG/ACT IN AERS
1.0000 | INHALATION_SPRAY | RESPIRATORY_TRACT | Status: DC | PRN
  Administered 2022-02-06: 1 via RESPIRATORY_TRACT
  Administered 2022-02-06: 2 via RESPIRATORY_TRACT
  Administered 2022-02-06: 1 via RESPIRATORY_TRACT
  Administered 2022-02-07 – 2022-02-08 (×3): 2 via RESPIRATORY_TRACT
  Filled 2022-02-06: qty 6.7

## 2022-02-06 MED ORDER — ALBUTEROL SULFATE (2.5 MG/3ML) 0.083% IN NEBU: 2.5000 mg | INHALATION_SOLUTION | RESPIRATORY_TRACT | Status: DC | PRN

## 2022-02-06 NOTE — Progress Notes (Addendum)
  Progress Note   Patient: Cathy Spears FKC:[id number] DOB: 1936/01/19 DOA: 02/02/2022     3 DOS: the patient was seen and examined on 02/06/2022   Brief hospital course: [name] is a 86 y.o. female with medical history significant for A-fib on Eliquis, HTN, breast cancer, GERD, recent hospitalization from 7/2 to 7/4 for CHF and possible laryngeal spasm with normal flex laryngoscopy, who returns to the ED with a complaint of shortness of breath that started the day prior associated with cough productive of white phlegm.  Upon arriving the hospital, patient was found to have paroxysmal atrial fibrillation with rapid ventricular response, started on diltiazem drip, continued on apixaban. Patient was also placed on steroids for COPD exacerbation. CT scan ruled out PE.  Assessment and Plan:  Paroxysmal atrial fibrillation with rapid ventricular response. Heart rate controlled well, continue anticoagulation.  COPD exacerbation. Parainfluenza infection. Pulm hypertension. Patient still has a cough, every time she coughed, she could not catch breath.  No bronchospasm.  Xanax seem to work. At this point, I will continue Solu-Medrol, after initial refusal, she was able to tolerate it.  Continue Zithromax. I also asked the family to her to try albuterol inhalers.  Continue incentive spirometer.    Chronic diastolic congestive heart failure. Stable  Obesity with BMI 32.34. Diet and exercise     Subjective:  Patient continued to have a cough, sometimes productive of white mucus.  Other times, she could not cough up any mucus, which caused her to have a sensation of choking.  No hypoxia anymore.  Physical Exam: Vitals:   02/05/22 1945 02/06/22 0403 02/06/22 0408 02/06/22 0832  BP: (!) 185/174 (!) 108/56  120/73  Pulse: (!) 108 79  96  Resp: 18 16    Temp: 97.6 F (36.4 C) 97.7 F (36.5 C)  98 F (36.7 C)  TempSrc:  Oral  Oral  SpO2: 95% 97%  99%  Weight:   80.2 kg    Height:       General exam: Appears calm and comfortable  Respiratory system: Clear to auscultation. Respiratory effort normal. Cardiovascular system: S1 & S2 heard, RRR. No JVD, murmurs, rubs, gallops or clicks. No pedal edema. Gastrointestinal system: Abdomen is nondistended, soft and nontender. No organomegaly or masses felt. Normal bowel sounds heard. Central nervous system: Alert and oriented. No focal neurological deficits. Extremities: Symmetric 5 x 5 power. Skin: No rashes, lesions or ulcers Psychiatry: Judgement and insight appear normal. Mood & affect appropriate.   Data Reviewed:  No new lab results.  Family Communication: Daughter updated  Disposition: Status is: Inpatient Remains inpatient appropriate because: Severity of the illness  Planned Discharge Destination: Home    Time spent: 35 minutes  Author: Sharen Hones, MD 02/06/2022 10:43 AM  For on call review [email or web address].

## 2022-02-06 NOTE — Progress Notes (Signed)
Mobility Specialist - Progress Note   02/06/22 1700  Mobility  Activity Ambulated with assistance in hallway  Level of Assistance Standby assist, set-up cues, supervision of patient - no hands on  Assistive Device None  Distance Ambulated (ft) 160 ft  Activity Response Tolerated well  $Mobility charge 1 Mobility     During mobility: 100 HR, 96% SpO2   Pt sitting in chair upon arrival, utilizing 2L. Pt ambulated in hallway with supervision, no AD. A little fatigued with activity, still some SOB, but sats maintained in high 90s. Pt returned to chair with needs in reach.    Kathee Delton Mobility Specialist 02/06/22, 5:11 PM

## 2022-02-06 NOTE — Progress Notes (Signed)
PULMONOLOGY         Date: 02/06/2022,   MRN# [id number] Cathy Spears 1935-10-21     AdmissionWeight: 83.4 kg                 CurrentWeight: 80.2 kg  Referring provider: Dr Tilden Dome   CHIEF COMPLAINT:   Acute exacerbation of COPD with hypoxemia   HISTORY OF PRESENT ILLNESS   This is an 86yo F with hx of AF on chronic AC, HTN breast CA, GERD, s/p dc earlier this month with AECOPD and forceful cough. She had CTPE done on arrival with findings of subsegmental atelectasis and bronchitic changes. RVP was done with findings of acute Parainflenza infection.   -02/06/22 - no overnight events. Patient gradually imprvoing, may dc home with prednisone taper and 1 week outpatietn follow up in clinic with [facility] pulmonary   PAST MEDICAL HISTORY   Past Medical History:  Diagnosis Date   Anxiety    Breast cancer ([place])    Cancer ([place])    ,bilateral lymph nodes removed  breast mostly from the left   Cataracts, bilateral    Complication of anesthesia    Dysrhythmia    hx of AF   Esophageal erosions    GERD (gastroesophageal reflux disease)    Hypertension    dr Saralyn Pilar  at [facility] clinic   Personal history of radiation therapy    PONV (postoperative nausea and vomiting)    spinal anesthesia x1     SURGICAL HISTORY   Past Surgical History:  Procedure Laterality Date   BALLOON DILATION N/A 09/24/2017   Procedure: BALLOON DILATION;  Surgeon: Toledo, Benay Pike, MD;  Location: [facility] ENDOSCOPY;  Service: Gastroenterology;  Laterality: N/A;   BREAST BIOPSY Right    BREAST LUMPECTOMY Bilateral    2010   BREAST SURGERY     lumpectomy bil,pt. states lymph nodes were removed and  the left arm is restricted   COLONOSCOPY WITH ESOPHAGOGASTRODUODENOSCOPY (EGD)     ESOPHAGOGASTRODUODENOSCOPY (EGD) WITH PROPOFOL N/A 09/24/2017   Procedure: ESOPHAGOGASTRODUODENOSCOPY (EGD) WITH PROPOFOL;  Surgeon: Toledo, Benay Pike, MD;  Location: [facility] ENDOSCOPY;  Service: Gastroenterology;   Laterality: N/A;   HERNIA REPAIR     LUMBAR LAMINECTOMY/DECOMPRESSION MICRODISCECTOMY  08/22/2011   Procedure: LUMBAR LAMINECTOMY/DECOMPRESSION MICRODISCECTOMY;  Surgeon: Ophelia Charter, MD;  Location: [facility];  Service: Neurosurgery;  Laterality: Right;  RIGHT Lumbar five sacral one  laminectomy and microdiscectomy   TOTAL KNEE ARTHROPLASTY Right 01/14/2019   Procedure: TOTAL KNEE ARTHROPLASTY RIGHT;  Surgeon: Corky Mull, MD;  Location: [facility] ORS;  Service: Orthopedics;  Laterality: Right;     FAMILY HISTORY   History reviewed. No pertinent family history.   SOCIAL HISTORY   Social History   Tobacco Use   Smoking status: Never   Smokeless tobacco: Never  Vaping Use   Vaping Use: Never used  Substance Use Topics   Alcohol use: No   Drug use: No     MEDICATIONS    Home Medication:    Current Medication:  Current Facility-Administered Medications:    acetaminophen (TYLENOL) tablet 650 mg, 650 mg, Oral, Q4H PRN, Athena Masse, MD   albuterol (VENTOLIN HFA) 108 (90 Base) MCG/ACT inhaler 1-2 puff, 1-2 puff, Inhalation, Q4H PRN, Darrick Penna, RPH   ALPRAZolam Duanne Moron) tablet 0.25 mg, 0.25 mg, Oral, BID PRN, Foust, Katy L, NP, 0.25 mg at 02/05/22 2219   apixaban (ELIQUIS) tablet 5 mg, 5 mg, Oral, BID, Athena Masse,  MD, 5 mg at 02/05/22 2218   azithromycin (ZITHROMAX) tablet 500 mg, 500 mg, Oral, Daily, Oswald Hillock, RPH, 500 mg at 02/05/22 1020   diltiazem (CARDIZEM CD) 24 hr capsule 120 mg, 120 mg, Oral, Daily, Sharen Hones, MD, 120 mg at 02/05/22 1020   feeding supplement (ENSURE ENLIVE / ENSURE PLUS) liquid 237 mL, 237 mL, Oral, BID BM, Sharen Hones, MD, 237 mL at 02/05/22 1559   furosemide (LASIX) tablet 40 mg, 40 mg, Oral, Daily, Judd Gaudier V, MD, 40 mg at 02/05/22 1020   guaiFENesin (MUCINEX) 12 hr tablet 1,200 mg, 1,200 mg, Oral, BID, Sharen Hones, MD, 1,200 mg at 02/05/22 2218   guaiFENesin-dextromethorphan (ROBITUSSIN DM) 100-10 MG/5ML syrup 5  mL, 5 mL, Oral, Q4H PRN, Sharen Hones, MD, 5 mL at 02/05/22 2323   melatonin tablet 2.5 mg, 2.5 mg, Oral, QHS, Sharen Hones, MD, 2.5 mg at 02/05/22 2218   methylPREDNISolone sodium succinate (SOLU-MEDROL) 40 mg/mL injection 40 mg, 40 mg, Intravenous, Q12H, Sharen Hones, MD, 40 mg at 02/05/22 2217   metoprolol tartrate (LOPRESSOR) tablet 50 mg, 50 mg, Oral, BID, Athena Masse, MD, 50 mg at 02/05/22 2218   multivitamin with minerals tablet 1 tablet, 1 tablet, Oral, Daily, Sharen Hones, MD, 1 tablet at 02/05/22 1020   multivitamin-lutein (OCUVITE-LUTEIN) capsule 1 capsule, 1 capsule, Oral, BID, Judd Gaudier V, MD, 1 capsule at 02/05/22 1030   ondansetron (ZOFRAN) injection 4 mg, 4 mg, Intravenous, Q6H PRN, Athena Masse, MD   pantoprazole (PROTONIX) EC tablet 40 mg, 40 mg, Oral, Daily, Judd Gaudier V, MD, 40 mg at 02/05/22 0800   potassium chloride SA (KLOR-CON M) CR tablet 20 mEq, 20 mEq, Oral, Daily, Judd Gaudier V, MD, 20 mEq at 02/05/22 1027   tiotropium (SPIRIVA) inhalation capsule ([facility] use ONLY) 18 mcg, 18 mcg, Inhalation, Daily, Sharen Hones, MD, 18 mcg at 02/03/22 1550    ALLERGIES   Fish-derived products and Oxycodone     REVIEW OF SYSTEMS    Review of Systems:  Gen:  Denies  fever, sweats, chills weigh loss  HEENT: Denies blurred vision, double vision, ear pain, eye pain, hearing loss, nose bleeds, sore throat Cardiac:  No dizziness, chest pain or heaviness, chest tightness,edema Resp:   reports dyspnea chronically  Gi: Denies swallowing difficulty, stomach pain, nausea or vomiting, diarrhea, constipation, bowel incontinence Gu:  Denies bladder incontinence, burning urine Ext:   Denies Joint pain, stiffness or swelling Skin: Denies  skin rash, easy bruising or bleeding or hives Endoc:  Denies polyuria, polydipsia , polyphagia or weight change Psych:   Denies depression, insomnia or hallucinations   Other:  All other systems negative   VS: BP 120/73   Pulse 96    Temp 98 F (36.7 C) (Oral)   Resp 16   Ht '5\' 2"'$  (1.575 m)   Wt 80.2 kg   SpO2 99%   BMI 32.34 kg/m      PHYSICAL EXAM    GENERAL:NAD, no fevers, chills, no weakness no fatigue HEAD: Normocephalic, atraumatic.  EYES: Pupils equal, round, reactive to light. Extraocular muscles intact. No scleral icterus.  MOUTH: Moist mucosal membrane. Dentition intact. No abscess noted.  EAR, NOSE, THROAT: Clear without exudates. No external lesions.  NECK: Supple. No thyromegaly. No nodules. No JVD.  PULMONARY: decreased breath sounds with mild rhonchi worse at bases bilaterally.  CARDIOVASCULAR: S1 and S2. Regular rate and rhythm. No murmurs, rubs, or gallops. No edema. Pedal pulses 2+ bilaterally.  GASTROINTESTINAL: Soft, nontender,  nondistended. No masses. Positive bowel sounds. No hepatosplenomegaly.  MUSCULOSKELETAL: No swelling, clubbing, or edema. Range of motion full in all extremities.  NEUROLOGIC: Cranial nerves II through XII are intact. No gross focal neurological deficits. Sensation intact. Reflexes intact.  SKIN: No ulceration, lesions, rashes, or cyanosis. Skin warm and dry. Turgor intact.  PSYCHIATRIC: Mood, affect within normal limits. The patient is awake, alert and oriented x 3. Insight, judgment intact.       IMAGING     ASSESSMENT/PLAN   Severe acute exacerbation of COPD      -Due to parainfluenza infection       - curently on 2L/min [place]      - Currently on zithromax and soluedrol '40mg'$  IV bid     - continue supportive care with incentive spirometry and flutter valve   Chronic pulmonary hypertension       -unclear how she is being treated for this will follow up on outpatient   Atrial fibrillation     On eliquis         Thank you for allowing me to participate in the care of this patient.   Patient/Family are satisfied with care plan and all questions have been answered.    Provider disclosure: Patient with at least one acute or chronic illness or  injury that poses a threat to life or bodily function and is being managed actively during this encounter.  All of the below services have been performed independently by signing provider:  review of prior documentation from internal and or external health records.  Review of previous and current lab results.  Interview and comprehensive assessment during patient visit today. Review of current and previous chest radiographs/CT scans. Discussion of management and test interpretation with health care team and patient/family.   This document was prepared using Dragon voice recognition software and may include unintentional dictation errors.     Ottie Glazier, M.D.  Division of Pulmonary & Critical Care Medicine

## 2022-02-07 DIAGNOSIS — I480 Paroxysmal atrial fibrillation: Secondary | ICD-10-CM | POA: Diagnosis not present

## 2022-02-07 DIAGNOSIS — J441 Chronic obstructive pulmonary disease with (acute) exacerbation: Secondary | ICD-10-CM | POA: Diagnosis not present

## 2022-02-07 DIAGNOSIS — E871 Hypo-osmolality and hyponatremia: Secondary | ICD-10-CM

## 2022-02-07 DIAGNOSIS — B348 Other viral infections of unspecified site: Secondary | ICD-10-CM | POA: Diagnosis not present

## 2022-02-07 LAB — BASIC METABOLIC PANEL
Anion gap: 12 (ref 5–15)
BUN: 35 mg/dL — ABNORMAL HIGH (ref 8–23)
CO2: 27 mmol/L (ref 22–32)
Calcium: 9.2 mg/dL (ref 8.9–10.3)
Chloride: 96 mmol/L — ABNORMAL LOW (ref 98–111)
Creatinine, Ser: 0.73 mg/dL (ref 0.44–1.00)
GFR, Estimated: >60 mL/min (ref >60)
Glucose, Bld: 195 mg/dL — ABNORMAL HIGH (ref 70–99)
Potassium: 4.1 mmol/L (ref 3.5–5.1)
Sodium: 135 mmol/L (ref 135–145)

## 2022-02-07 LAB — MAGNESIUM: Magnesium: 2.6 mg/dL — ABNORMAL HIGH (ref 1.7–2.4)

## 2022-02-07 MED ORDER — PANTOPRAZOLE SODIUM 40 MG PO TBEC
40.0000 mg | DELAYED_RELEASE_TABLET | Freq: Two times a day (BID) | ORAL | Status: DC
  Administered 2022-02-08: 40 mg via ORAL
  Filled 2022-02-07: qty 1

## 2022-02-07 MED ORDER — LACTULOSE 10 GM/15ML PO SOLN
20.0000 g | Freq: Once | ORAL | Status: AC
  Administered 2022-02-07: 20 g via ORAL
  Filled 2022-02-07: qty 30

## 2022-02-07 MED ORDER — SIMETHICONE 80 MG PO CHEW
80.0000 mg | CHEWABLE_TABLET | Freq: Four times a day (QID) | ORAL | Status: DC | PRN
Start: 2022-02-07 — End: 2022-02-08

## 2022-02-07 NOTE — Care Management Important Message (Signed)
Important Message  Patient Details  Name: AISLEE LANDGREN MRN: [id number] Date of Birth: Jan 05, 1936   Medicare Important Message Given:  Yes     Dannette Barbara 02/07/2022, 11:27 AM

## 2022-02-07 NOTE — Progress Notes (Signed)
Physical Therapy Treatment Patient Details Name: Cathy Spears MRN: [id number] DOB: 1935/11/25 Today's Date: 02/07/2022   History of Present Illness Cathy Spears is a 86 y.o. female with medical history significant for A-fib on Eliquis, HTN, breast cancer, GERD, recent hospitalization from 7/2 to 7/4 for CHF and possible laryngeal spasm with normal flex laryngoscopy, who returns to the ED with a complaint of shortness of breath that started the day prior associated with cough productive of white phlegm    PT Comments    Completes x 2 laps on unit with no device.  Overall improved gait from prior session.  Less sway and appears more confident,  Recommendations for follow up therapy are one component of a multi-disciplinary discharge planning process, led by the attending physician.  Recommendations may be updated based on patient status, additional functional criteria and insurance authorization.  Follow Up Recommendations  No PT follow up     Assistance Recommended at Discharge PRN  Patient can return home with the following Assistance with cooking/housework;Assist for transportation;Help with stairs or ramp for entrance   Equipment Recommendations  None recommended by PT    Recommendations for Other Services       Precautions / Restrictions Precautions Precautions: Fall Precaution Comments: Mod fall risk, watch HR Restrictions Weight Bearing Restrictions: No     Mobility  Bed Mobility               General bed mobility comments: pt received, left in recliner    Transfers Overall transfer level: Modified independent Equipment used: None Transfers: Sit to/from Stand Sit to Stand: Modified independent (Device/Increase time)                Ambulation/Gait Ambulation/Gait assistance: Modified independent (Device/Increase time), Supervision Gait Distance (Feet): 350 Feet Assistive device: None   Gait velocity: WFL     General Gait Details: gait  improved today with less sway   Stairs             Wheelchair Mobility    Modified Rankin (Stroke Patients Only)       Balance Overall balance assessment: Needs assistance Sitting-balance support: Feet supported Sitting balance-Leahy Scale: Good     Standing balance support: No upper extremity supported, During functional activity Standing balance-Leahy Scale: Good                              Cognition Arousal/Alertness: Awake/alert Behavior During Therapy: WFL for tasks assessed/performed Overall Cognitive Status: Within Functional Limits for tasks assessed                                          Exercises      General Comments        Pertinent Vitals/Pain Pain Assessment Pain Assessment: No/denies pain    Home Living                          Prior Function            PT Goals (current goals can now be found in the care plan section) Progress towards PT goals: Progressing toward goals    Frequency    Min 2X/week      PT Plan Current plan remains appropriate    Co-evaluation  AM-PAC PT "6 Clicks" Mobility   Outcome Measure  Help needed turning from your back to your side while in a flat bed without using bedrails?: None Help needed moving from lying on your back to sitting on the side of a flat bed without using bedrails?: None Help needed moving to and from a bed to a chair (including a wheelchair)?: None Help needed standing up from a chair using your arms (e.g., wheelchair or bedside chair)?: None Help needed to walk in hospital room?: None Help needed climbing 3-5 steps with a railing? : A Little 6 Click Score: 23    End of Session   Activity Tolerance: Patient tolerated treatment well Patient left: in chair;with family/visitor present Nurse Communication: Mobility status PT Visit Diagnosis: Muscle weakness (generalized) (M62.81);Other abnormalities of gait and mobility  (R26.89)     Time: 8882-8003 PT Time Calculation (min) (ACUTE ONLY): 8 min  Charges:  $Gait Training: 8-22 mins                   Chesley Noon, PTA 02/07/22, 9:59 AM

## 2022-02-07 NOTE — Progress Notes (Signed)
Occupational Therapy Treatment Patient Details Name: Cathy Spears MRN: [id number] DOB: 05/12/1936 Today's Date: 02/07/2022   History of present illness Cathy Spears is a 86 y.o. female with medical history significant for A-fib on Eliquis, HTN, breast cancer, GERD, recent hospitalization from 7/2 to 7/4 for CHF and possible laryngeal spasm with normal flex laryngoscopy, who returns to the ED with a complaint of shortness of breath that started the day prior associated with cough productive of white phlegm   OT comments  Pt seen for OT tx. Pt finishing up in the bathroom with daughter standing outside bathroom door upon OT's arrival. Pt endorsing intermittent seemingly random feelings of something caught in her throat making it difficult to breathe and an unproductive cough. Not exacerbated by exertion and does not impact eating or drinking. Pt/dtr instructed in incentive spirometer and flutter valve use with teach back to ensure proper technique. Pt demonstrated understanding and dtr verbalized understanding. Pt/dtr educated on energy conservation strategies to incorporate into daily routines. Pt progressing towards OT goals while hospitalized.    Recommendations for follow up therapy are one component of a multi-disciplinary discharge planning process, led by the attending physician.  Recommendations may be updated based on patient status, additional functional criteria and insurance authorization.    Follow Up Recommendations  No OT follow up    Assistance Recommended at Discharge Intermittent Supervision/Assistance  Patient can return home with the following  A little help with bathing/dressing/bathroom;Assistance with cooking/housework;Assist for transportation   Equipment Recommendations  None recommended by OT    Recommendations for Other Services      Precautions / Restrictions Precautions Precautions: Fall Precaution Comments: Mod fall risk, watch HR Restrictions Weight  Bearing Restrictions: No       Mobility Bed Mobility               General bed mobility comments: pt received, left in arm chair in room (not recliner)    Transfers Overall transfer level: Modified independent Equipment used: None Transfers: Sit to/from Stand Sit to Stand: Modified independent (Device/Increase time)                 Balance Overall balance assessment: Needs assistance Sitting-balance support: Feet supported Sitting balance-Leahy Scale: Good     Standing balance support: No upper extremity supported, During functional activity Standing balance-Leahy Scale: Good                             ADL either performed or assessed with clinical judgement   ADL Overall ADL's : Needs assistance/impaired     Grooming: Wash/dry hands;Standing;Supervision/safety Grooming Details (indicate cue type and reason): remote supervision from daughter after toileting                 Toilet Transfer: Sales executive;Ambulation Toilet Transfer Details (indicate cue type and reason): pt completed just prior to OT's arrival with daughter providing supervision Toileting- Water quality scientist and Hygiene: Supervision/safety Toileting - Clothing Manipulation Details (indicate cue type and reason): remote supervision            Extremity/Trunk Assessment              Vision       Perception     Praxis      Cognition Arousal/Alertness: Awake/alert Behavior During Therapy: WFL for tasks assessed/performed Overall Cognitive Status: Within Functional Limits for tasks assessed  Exercises Other Exercises Other Exercises: Pt/dtr instructed in incentive spirometer and flutter valve use with teach back to ensure proper technique    Shoulder Instructions       General Comments      Pertinent Vitals/ Pain       Pain Assessment Pain Assessment: No/denies  pain  Home Living                                          Prior Functioning/Environment              Frequency  Min 2X/week        Progress Toward Goals  OT Goals(current goals can now be found in the care plan section)  Progress towards OT goals: Progressing toward goals  Acute Rehab OT Goals Patient Stated Goal: go home OT Goal Formulation: With patient Time For Goal Achievement: 02/16/22 Potential to Achieve Goals: Good  Plan Discharge plan remains appropriate;Frequency remains appropriate    Co-evaluation                 AM-PAC OT "6 Clicks" Daily Activity     Outcome Measure   Help from another person eating meals?: None Help from another person taking care of personal grooming?: None Help from another person toileting, which includes using toliet, bedpan, or urinal?: A Little Help from another person bathing (including washing, rinsing, drying)?: A Little Help from another person to put on and taking off regular upper body clothing?: None Help from another person to put on and taking off regular lower body clothing?: A Little 6 Click Score: 21    End of Session    OT Visit Diagnosis: Unsteadiness on feet (R26.81)   Activity Tolerance Patient tolerated treatment well   Patient Left in chair;with call bell/phone within reach;with family/visitor present   Nurse Communication          Time: 7124-5809 OT Time Calculation (min): 17 min  Charges: OT General Charges $OT Visit: 1 Visit OT Treatments $Self Care/Home Management : 8-22 mins  Ardeth Perfect., MPH, MS, OTR/L ascom [phone] 02/07/22, 3:30 PM

## 2022-02-07 NOTE — Consult Note (Addendum)
[place] NOTE       Patient ID: Cathy Spears MRN: [id number] DOB/AGE: 1935-12-15 86 y.o.  Admit date: 02/02/2022 Referring Physician Dr. Roosevelt Locks Primary Physician Dr. Emily Filbert Primary Cardiologist Dr. Saralyn Pilar Reason for Consultation AF RVR  HPI: Cathy Spears is an 36yoF with a PMH of paroxysmal atrial fibrillation (initially noted postoperatively 12/2018), hypertension, history of paroxysmal SVT, hyperlipidemia, COPD, GERD, chronic cough who presented to [facility] ED 02/02/2022 with a COPD exacerbation secondary to parainfluenza virus.  She was recently admitted for 2 days and discharged on 7/4 after having coughing and choking and was treated for diastolic CHF and A-fib with RVR. Cardiology is consulted on hospital day 5 for assistance with her atrial fibrillation.  She presents today with one of her in-laws and her daughter provides history by phone.  The patient's main complaint is he is coughing spells where she feels like she is choking and her throat is closing up, these are sometimes relieved with sips of water and can feel better if she actually coughs up mucus.  She was discovered to have paroxysmal A-fib after a knee surgery and her daughter thinks she was asymptomatic from that, never having palpitations which the patient confirms today.  She does not notice her heart racing, although there have been periods of time during this hospitalization where she has been in A-fib with RVR, starting yesterday afternoon where she had brief paroxysms of RVR with rates 140s to 150s most recently this morning from about 9 AM to 11:00 where her heart rate was between the 110s and 120s, she last received albuterol this morning at 7:00 AM.  Notably, these coughing and choking spells have gone on during this entire hospitalization and she is being treated for COPD exacerbation possibly secondary to parainfluenza virus with IV steroids per pulmonology.  She denies chest pain,  palpitations, her leg swelling has much improved since admission.  She was seen by her regular cardiologist on 7/6 where she had a persistent wet cough was without palpitations.  A rate control strategy for her atrial fibrillation was continued, she strongly wanted to avoid starting on antiarrhythmics due to potential side effects.  Labs checked 4 days ago on 7/9 show a potassium of 4.1, magnesium of 2.1, creatinine 0.97, GFR 58.  Her hemoglobin and hematocrit are 13/40, platelets 282.  Respiratory panel positive for parainfluenza virus on admission.  Chest x-ray with stable cardiomegaly without focal infiltrate or effusion.  Review of systems complete and found to be negative unless listed above     Past Medical History:  Diagnosis Date   Anxiety    Breast cancer ([place])    Cancer ([place])    ,bilateral lymph nodes removed  breast mostly from the left   Cataracts, bilateral    Complication of anesthesia    Dysrhythmia    hx of AF   Esophageal erosions    GERD (gastroesophageal reflux disease)    Hypertension    dr Saralyn Pilar  at [facility] clinic   Personal history of radiation therapy    PONV (postoperative nausea and vomiting)    spinal anesthesia x1    Past Surgical History:  Procedure Laterality Date   BALLOON DILATION N/A 09/24/2017   Procedure: BALLOON DILATION;  Surgeon: Toledo, Benay Pike, MD;  Location: [facility] ENDOSCOPY;  Service: Gastroenterology;  Laterality: N/A;   BREAST BIOPSY Right    BREAST LUMPECTOMY Bilateral    2010   BREAST SURGERY     lumpectomy bil,pt. states  lymph nodes were removed and  the left arm is restricted   COLONOSCOPY WITH ESOPHAGOGASTRODUODENOSCOPY (EGD)     ESOPHAGOGASTRODUODENOSCOPY (EGD) WITH PROPOFOL N/A 09/24/2017   Procedure: ESOPHAGOGASTRODUODENOSCOPY (EGD) WITH PROPOFOL;  Surgeon: Toledo, Benay Pike, MD;  Location: [facility] ENDOSCOPY;  Service: Gastroenterology;  Laterality: N/A;   HERNIA REPAIR     LUMBAR LAMINECTOMY/DECOMPRESSION MICRODISCECTOMY   08/22/2011   Procedure: LUMBAR LAMINECTOMY/DECOMPRESSION MICRODISCECTOMY;  Surgeon: Ophelia Charter, MD;  Location: [place] [facility]UR[facility] ORS;  Service: Neurosurgery;  Laterality: Right;  RIGHT Lumbar five sacral one  laminectomy and microdiscectomy   TOTAL KNEE ARTHROPLASTY Right 01/14/2019   Procedure: TOTAL KNEE ARTHROPLASTY RIGHT;  Surgeon: Corky Mull, MD;  Location: [facility] ORS;  Service: Orthopedics;  Laterality: Right;    Medications Prior to Admission  Medication Sig Dispense Refill Last Dose   apixaban (ELIQUIS) 5 MG TABS tablet Take 1 tablet (5 mg total) by mouth 2 (two) times daily. 60 tablet 0 02/01/2022   Calcium Carbonate-Vitamin D (TH CALCIUM CARBONATE-VITAMIN D) 600-400 MG-UNIT tablet Take 1 tablet by mouth 2 (two) times daily.   02/01/2022   cholecalciferol (VITAMIN D) 1000 UNITS tablet Take 1,000 Units by mouth daily.   02/01/2022   diltiazem (CARDIZEM CD) 120 MG 24 hr capsule Take 1 capsule (120 mg total) by mouth daily. 30 capsule 0 02/01/2022   furosemide (LASIX) 40 MG tablet Take 1 tablet (40 mg total) by mouth daily. 30 tablet 0 02/01/2022   guaiFENesin (MUCINEX) 600 MG 12 hr tablet Take 1 tablet (600 mg total) by mouth 2 (two) times daily as needed for up to 14 days for to loosen phlegm. 28 tablet 0 Past Week   metoprolol (LOPRESSOR) 50 MG tablet Take 50 mg by mouth 2 (two) times daily.   02/01/2022   Multiple Vitamins-Minerals (PRESERVISION AREDS PO) Take 1 tablet by mouth 2 (two) times daily.   02/01/2022   omeprazole (PRILOSEC) 40 MG capsule Take 40 mg by mouth 2 (two) times a day.    02/01/2022   potassium chloride SA (KLOR-CON M) 20 MEQ tablet Take 1 tablet (20 mEq total) by mouth daily. Take one tablet (20 mEq) with your FUROSEMIDE dose on days when you are taking it. 30 tablet 0 02/01/2022   vitamin B-12 (CYANOCOBALAMIN) 1000 MCG tablet Take 1,000 mcg by mouth daily.   02/01/2022   Social History   Socioeconomic History   Marital status: Married    Spouse name: Not on file   Number of children:  Not on file   Years of education: Not on file   Highest education level: Not on file  Occupational History   Not on file  Tobacco Use   Smoking status: Never   Smokeless tobacco: Never  Vaping Use   Vaping Use: Never used  Substance and Sexual Activity   Alcohol use: No   Drug use: No   Sexual activity: Not on file  Other Topics Concern   Not on file  Social History Narrative   Not on file   Social Determinants of Health   Financial Resource Strain: Not on file  Food Insecurity: Not on file  Transportation Needs: Not on file  Physical Activity: Not on file  Stress: Not on file  Social Connections: Not on file  Intimate Partner Violence: Not on file    History reviewed. No pertinent family history.    PHYSICAL EXAM General: Elderly Caucasian female, well nourished, in no acute distress.  Sitting upright in hospital bed with her in-law  at bedside and daughter present by phone HEENT:  Normocephalic and atraumatic. Neck:  No JVD.  Lungs: Normal respiratory effort on room air, nasal cannula not on the patient's face. Clear bilaterally to auscultation. No wheezes, crackles, rhonchi.  Heart: Irregularly irregular rhythm with controlled rate. Normal S1 and S2 without gallops or murmurs. Radial & DP pulses 2+ bilaterally. Abdomen: Obese appearing.  Msk: Normal strength and tone for age. Extremities: Warm and well perfused. No clubbing, cyanosis.  Trace bilateral lower extremity edema.  Neuro: Alert and oriented X 3. Psych:  Answers questions appropriately.   Labs:   Lab Results  Component Value Date   WBC 9.5 02/06/2022   HGB 13.9 02/06/2022   HCT 43.2 02/06/2022   MCV 80.7 02/06/2022   PLT 320 02/06/2022    Recent Labs  Lab 02/03/22 2025  NA 137  K 4.1  CL 99  CO2 28  BUN 28*  CREATININE 0.97  CALCIUM 9.0  GLUCOSE 172*   Lab Results  Component Value Date   TROPONINI <0.03 01/15/2019   No results found for: "CHOL" No results found for: "HDL" No results  found for: "LDLCALC" No results found for: "TRIG" No results found for: "CHOLHDL" No results found for: "LDLDIRECT"    Radiology: DG Abd 1 View  Result Date: 02/06/2022 CLINICAL DATA:  Abdominal distension EXAM: ABDOMEN - 1 VIEW COMPARISON:  None Available. FINDINGS: There is no evidence of bowel obstruction. There is a moderate to large stool burden. There is gaseous distension of the stomach. Degenerative changes spine. No acute osseous abnormality. Mild bilateral hip osteoarthritis. Degenerative changes of the symphysis pubis. IMPRESSION: No evidence of bowel obstruction. Moderate to large stool burden. Gaseous distention of the stomach. Electronically Signed   By: Maurine Simmering M.D.   On: 02/06/2022 13:04   DG Chest Port 1 View  Result Date: 02/03/2022 CLINICAL DATA:  Dyspnea EXAM: PORTABLE CHEST 1 VIEW COMPARISON:  02/01/2022 FINDINGS: Cardiac shadow is enlarged but stable. Lungs are well aerated bilaterally. No focal infiltrate or sizable effusion is seen. Degenerative changes about shoulder joints are noted. IMPRESSION: Cardiomegaly stable in appearance. No acute abnormality noted. Electronically Signed   By: Inez Catalina M.D.   On: 02/03/2022 21:52   CT Angio Chest Pulmonary Embolism (PE) W or WO Contrast  Result Date: 02/02/2022 CLINICAL DATA:  Pulmonary embolism (PE) suspected, high prob EXAM: CT ANGIOGRAPHY CHEST WITH CONTRAST TECHNIQUE: Multidetector CT imaging of the chest was performed using the standard protocol during bolus administration of intravenous contrast. Multiplanar CT image reconstructions and MIPs were obtained to evaluate the vascular anatomy. RADIATION DOSE REDUCTION: This exam was performed according to the departmental dose-optimization program which includes automated exposure control, adjustment of the mA and/or kV according to patient size and/or use of iterative reconstruction technique. CONTRAST:  69m OMNIPAQUE IOHEXOL 350 MG/ML SOLN COMPARISON:  None Available.  FINDINGS: Cardiovascular: Satisfactory opacification of the pulmonary arteries to the segmental level. No evidence of pulmonary embolism. Enlarged heart size. No pericardial effusion. Enlarged main pulmonary artery. Aortic atherosclerosis. Coronary artery atherosclerosis. Calcification of the mitral valve. Mediastinum/Nodes: No enlarged mediastinal, hilar, or axillary lymph nodes. Thyroid gland, trachea, and esophagus demonstrate no significant findings. Lungs/Pleura: Mild subsegmental atelectasis. No consolidation. No sizable pleural effusions. No pneumothorax. Upper Abdomen: No acute abnormality. Musculoskeletal: No chest wall abnormality. No acute or significant osseous findings. Review of the MIP images confirms the above findings. IMPRESSION: 1. No evidence of acute pulmonary embolism. 2. Cardiomegaly. 3. Mild subsegmental atelectasis. Enlarged main  pulmonary artery which indicates pulmonary arterial hypertension. 4. Mild subsegmental atelectasis. 5.  Aortic Atherosclerosis (ICD10-I70.0). Electronically Signed   By: Margaretha Sheffield M.D.   On: 02/02/2022 12:29   DG Chest 2 View  Result Date: 02/01/2022 CLINICAL DATA:  Short of breath since yesterday EXAM: CHEST - 2 VIEW COMPARISON:  01/27/2022 FINDINGS: Frontal and lateral views of the chest demonstrates stable cardiac silhouette. No acute airspace disease, effusion, or pneumothorax. Stable hyperinflation. No acute bony abnormalities. IMPRESSION: 1. Stable enlarged cardiac silhouette.  No acute airspace disease. Electronically Signed   By: Randa Ngo M.D.   On: 02/01/2022 20:47   ECHOCARDIOGRAM COMPLETE  Result Date: 01/28/2022    ECHOCARDIOGRAM REPORT   Patient Name:   Cathy Spears Date of Exam: 01/28/2022 Medical Rec #:  [id number]         Height:       62.0 in Accession #:    [id number]        Weight:       185.0 lb Date of Birth:  [birth year]         BSA:          1.849 m Patient Age:    31 years          BP:           108/63 mmHg Patient  Gender: F                 HR:           94 bpm. Exam Location:  [facility] Procedure: 2D Echo, Cardiac Doppler and Color Doppler Indications:     CHF-acute diastolic D63.87  History:         Patient has prior history of Echocardiogram examinations, most                  recent 01/15/2019. Risk Factors:Hypertension.  Sonographer:     Sherrie Sport Referring Phys:  [id number] JAN A MANSY Diagnosing Phys: Donnelly Angelica  Sonographer Comments: Technically challenging study due to limited acoustic windows and suboptimal apical window. IMPRESSIONS  1. Left ventricular ejection fraction, by estimation, is 55 to 60%. The left ventricle has normal function. The left ventricle has no regional wall motion abnormalities. There is mild left ventricular hypertrophy. Left ventricular diastolic parameters are indeterminate.  2. Right ventricular systolic function is mildly reduced. The right ventricular size is not well visualized but probably at least moderately dilated.  3. Left atrial size was severely dilated.  4. Right atrial size was moderately dilated.  5. The mitral valve is normal in structure. No evidence of mitral valve regurgitation. No evidence of mitral stenosis. The mean mitral valve gradient is 2.0 mmHg.  6. The aortic valve was not well visualized. Aortic valve regurgitation is not visualized. Aortic valve sclerosis is present, with no evidence of aortic valve stenosis.  7. The inferior vena cava is normal in size with greater than 50% respiratory variability, suggesting right atrial pressure of 3 mmHg. Conclusion(s)/Recommendation(s): Technically difficult study. RV looks at least moderately dilated with mildly decreased function. IVC is small and compressible indicating normal RA pressure. FINDINGS  Left Ventricle: Left ventricular ejection fraction, by estimation, is 55 to 60%. The left ventricle has normal function. The left ventricle has no regional wall motion abnormalities. The left ventricular internal cavity size was  normal in size. There is  mild left ventricular hypertrophy. Left ventricular diastolic parameters are indeterminate. Right Ventricle: The right ventricular size is not well visualized but  probably at least moderately dilated. Right vetricular wall thickness was not well visualized. Right ventricular systolic function is mildly reduced. Left Atrium: Left atrial size was severely dilated. Right Atrium: Right atrial size was moderately dilated. Pericardium: There is no evidence of pericardial effusion. Mitral Valve: The mitral valve is normal in structure. Mild mitral annular calcification. No evidence of mitral valve regurgitation. No evidence of mitral valve stenosis. MV peak gradient, 4.4 mmHg. The mean mitral valve gradient is 2.0 mmHg. Tricuspid Valve: The tricuspid valve is not well visualized. Tricuspid valve regurgitation is trivial. Aortic Valve: The aortic valve was not well visualized. Aortic valve regurgitation is not visualized. Aortic valve sclerosis is present, with no evidence of aortic valve stenosis. Aortic valve mean gradient measures 3.3 mmHg. Aortic valve peak gradient measures 6.2 mmHg. Aortic valve area, by VTI measures 2.19 cm. Pulmonic Valve: The pulmonic valve was not well visualized. Aorta: The aortic root and ascending aorta are structurally normal, with no evidence of dilitation. Venous: The inferior vena cava is normal in size with greater than 50% respiratory variability, suggesting right atrial pressure of 3 mmHg. IAS/Shunts: The interatrial septum was not well visualized.  LEFT VENTRICLE PLAX 2D LVIDd:         4.30 cm   Diastology LVIDs:         3.20 cm   LV e' medial:    9.14 cm/s LV PW:         1.40 cm   LV E/e' medial:  11.1 LV IVS:        1.20 cm   LV e' lateral:   10.60 cm/s LVOT diam:     2.00 cm   LV E/e' lateral: 9.5 LV SV:         45 LV SV Index:   24 LVOT Area:     3.14 cm  RIGHT VENTRICLE RV S prime:     12.60 cm/s TAPSE (M-mode): 1.4 cm LEFT ATRIUM              Index         RIGHT ATRIUM           Index LA diam:        4.90 cm  2.65 cm/m   RA Area:     24.20 cm LA Vol (A2C):   122.0 ml 65.97 ml/m  RA Volume:   82.90 ml  44.83 ml/m LA Vol (A4C):   109.0 ml 58.94 ml/m LA Biplane Vol: 123.0 ml 66.51 ml/m  AORTIC VALVE AV Area (Vmax):    2.02 cm AV Area (Vmean):   1.99 cm AV Area (VTI):     2.19 cm AV Vmax:           124.67 cm/s AV Vmean:          87.367 cm/s AV VTI:            0.207 m AV Peak Grad:      6.2 mmHg AV Mean Grad:      3.3 mmHg LVOT Vmax:         80.30 cm/s LVOT Vmean:        55.300 cm/s LVOT VTI:          0.144 m LVOT/AV VTI ratio: 0.70  AORTA Ao Root diam: 3.40 cm MITRAL VALVE                TRICUSPID VALVE MV Area (PHT): 5.16 cm     TR Peak grad:   27.2 mmHg  MV Area VTI:   2.34 cm     TR Vmax:        261.00 cm/s MV Peak grad:  4.4 mmHg MV Mean grad:  2.0 mmHg     SHUNTS MV Vmax:       1.05 m/s     Systemic VTI:  0.14 m MV Vmean:      64.2 cm/s    Systemic Diam: 2.00 cm MV Decel Time: 147 msec MV E velocity: 101.00 cm/s Donnelly Angelica Electronically signed by Donnelly Angelica Signature Date/Time: 01/28/2022/1:07:36 PM    Final    DG Chest 2 View  Result Date: 01/27/2022 CLINICAL DATA:  Shortness of breath. EXAM: CHEST - 2 VIEW COMPARISON:  January 26, 2022 FINDINGS: There is stable mild to moderate severity enlargement of the cardiac silhouette. There is marked severity calcification of the aortic arch. Stable, diffusely increased interstitial lung markings are noted. Mild prominence of the pulmonary vasculature is seen. There is no evidence of focal consolidation, pleural effusion or pneumothorax. Multilevel degenerative changes are seen throughout the thoracic spine. IMPRESSION: 1. Stable cardiomegaly with mild pulmonary vascular congestion. Electronically Signed   By: Virgina Norfolk M.D.   On: 01/27/2022 19:23   DG Chest 2 View  Result Date: 01/26/2022 CLINICAL DATA:  Per Triage: Pt via POV from home. Pt c/o dry cough that started last weekend. States that last  couple of days she has been coughing non-stop.Denies any chest pain. Denies any SOB. Denies fever. Pt is AANDOX4 and NAD EXAM: CHEST - 2 VIEW COMPARISON:  01/16/2019.  CT, 01/14/2019. FINDINGS: Stable enlargement of the cardiac silhouette. No mediastinal or hilar masses. No evidence of adenopathy. Prominent interstitial markings most evident in the bases, also stable. No evidence of pneumonia or pulmonary edema. No pleural effusion or pneumothorax. Skeletal structures are grossly intact. IMPRESSION: 1. No acute cardiopulmonary disease. Electronically Signed   By: Lajean Manes M.D.   On: 01/26/2022 14:14     TELEMETRY reviewed by me: Atrial fibrillation, mostly rate controlled with some paroxysms of RVR starting 7/11 with rates peaking at in the 150s, and 7/12 some RVR in the 110s to 120s, returning to the 90s during interview.  EKG reviewed by me: Atrial fibrillation rate 86  ASSESSMENT AND PLAN:  Cathy Spears is an 71yoF with a PMH of paroxysmal atrial fibrillation (initially noted postoperatively 12/2018), hypertension, history of paroxysmal SVT, hyperlipidemia, COPD, GERD, chronic cough who presented to [facility] ED 02/02/2022 with a COPD exacerbation secondary to parainfluenza virus.  She was recently admitted for 2 days and discharged on 7/4 after having coughing and choking and was treated for diastolic CHF and A-fib with RVR. Cardiology is consulted on hospital day 5 for assistance with her atrial fibrillation.  #Parainfluenza virus infection #COPD exacerbation #Paroxysmal atrial fibrillation with RVR The patient presented on 7/8 with coughing and choking spells and shortness of breath, was found to have a parainfluenza virus on admission and is being treated for a COPD exacerbation with IV steroids and also was started on albuterol yesterday.  She has had paroxysms of RVR during this hospitalization and it is unclear if if her coughing spells and choking sensation coincide with periods of RVR and is  symptomatic from them versus tachycardia from albuterol use.  I had a long discussion with the patient and her daughter by phone and in-law at bedside on the possibility of starting an antiarrhythmic (amiodarone) to see if she feels better (less choking sensation) when she is back into  sinus rhythm, but she strongly wants to avoid this due to side effects.  We discussed checking labs to make sure her electrolytes are within normal limits first and then possibly increasing her metoprolol as her blood pressure permits.  We also talked about how her dry cough could be residual from this viral infection and is unlikely to go away overnight. -Agree with supportive care for underlying viral infection and COPD exacerbation -Decrease use of albuterol as able, the patient was not wheezing on exam today -Continue metoprolol at 50 mg twice daily, diltiazem CD120 mg -Continue Eliquis 5 mg twice daily for stroke risk reduction (CHA2DS2-VASc 4) -Continue Lasix 40 mg once daily (home dose was 20 mg) -Check BMP and magnesium  This patient's plan of care was discussed and created with Dr. Nehemiah Massed and he is in agreement.  Signed: Tristan Schroeder , PA-C 02/07/2022, 10:26 AM [facility] Cardiology  The patient has had waxing and waning of heart rate control depending on albuterol use.  None of her heart rates have been sustained and or causing any significant problems.  We have discussed at length that she will need her primary issue addressed which is her inflammatory issues of her upper respiratory tract.  Other than that she will need heart rate control and anticoagulation which have been intact at this time.  The patient has been interviewed and examined. I agree with assessment and plan above. Serafina Royals MD [facility]

## 2022-02-07 NOTE — Progress Notes (Signed)
  Progress Note   Patient: Cathy Spears MOQ:[id number] DOB: 10-31-1935 DOA: 02/02/2022     4 DOS: the patient was seen and examined on 02/07/2022   Brief hospital course: Cathy Spears is a 86 y.o. female with medical history significant for A-fib on Eliquis, HTN, breast cancer, GERD, recent hospitalization from 7/2 to 7/4 for CHF and possible laryngeal spasm with normal flex laryngoscopy, who returns to the ED with a complaint of shortness of breath that started the day prior associated with cough productive of white phlegm.  Upon arriving the hospital, patient was found to have paroxysmal atrial fibrillation with rapid ventricular response, started on diltiazem drip, continued on apixaban. Patient was also placed on steroids for COPD exacerbation. CT scan ruled out PE.  Assessment and Plan: Paroxysmal atrial fibrillation with rapid ventricular response. Patient developed tachycardia again today.  Consult cardiology, also continued on metoprolol and diltiazem orally.  COPD exacerbation. Parainfluenza infection. Pulm hypertension. Patient still complaining of significant symptoms associate with cough, could not breathe with cough. But overall clinically patient is improving.  She is now able to tolerate albuterol inhalers. Continue to follow     Chronic diastolic congestive heart failure. Stable   Obesity with BMI 32.34. Diet and exercise        Subjective:  Complaining of cough and short of breath with cough.  Physical Exam: Vitals:   02/07/22 0500 02/07/22 0520 02/07/22 0809 02/07/22 1032  BP:  118/69 124/79 138/75  Pulse:  93 93 (!) 103  Resp:  '16 18 18  '$ Temp:  97.8 F (36.6 C) 98 F (36.7 C)   TempSrc:   Oral   SpO2:  98% 95% 95%  Weight: 79.4 kg     Height:       General exam: Appears calm and comfortable  Respiratory system: Clear to auscultation. Respiratory effort normal. Cardiovascular system: Irregular and tachycardic, no JVD, murmurs, rubs, gallops  or clicks. No pedal edema. Gastrointestinal system: Abdomen is nondistended, soft and nontender. No organomegaly or masses felt. Normal bowel sounds heard. Central nervous system: Alert and oriented. No focal neurological deficits. Extremities: Symmetric 5 x 5 power. Skin: No rashes, lesions or ulcers Psychiatry: Judgement and insight appear normal. Mood & affect appropriate.   Data Reviewed:  No new lab results.  Family Communication: Daughter updated at bedside.  Disposition: Status is: Inpatient Remains inpatient appropriate because: Severity of disease, persistent symptoms.  Planned Discharge Destination: Home with Home Health    Time spent: 29 minutes  Author: Sharen Hones, MD 02/07/2022 11:56 AM  For on call review [email or web address].

## 2022-02-08 DIAGNOSIS — I480 Paroxysmal atrial fibrillation: Secondary | ICD-10-CM | POA: Diagnosis not present

## 2022-02-08 DIAGNOSIS — J441 Chronic obstructive pulmonary disease with (acute) exacerbation: Secondary | ICD-10-CM | POA: Diagnosis not present

## 2022-02-08 DIAGNOSIS — B348 Other viral infections of unspecified site: Secondary | ICD-10-CM | POA: Diagnosis not present

## 2022-02-08 LAB — CBC
HCT: 42 % (ref 36.0–46.0)
Hemoglobin: 13.5 g/dL (ref 12.0–15.0)
MCH: 25.9 pg — ABNORMAL LOW (ref 26.0–34.0)
MCHC: 32.1 g/dL (ref 30.0–36.0)
MCV: 80.5 fL (ref 80.0–100.0)
Platelets: 285 10*3/uL (ref 150–400)
RBC: 5.22 MIL/uL — ABNORMAL HIGH (ref 3.87–5.11)
RDW: 15.1 % (ref 11.5–15.5)
WBC: 9.1 10*3/uL (ref 4.0–10.5)
nRBC: 0 % (ref 0.0–0.2)

## 2022-02-08 MED ORDER — PREDNISONE 20 MG PO TABS
40.0000 mg | ORAL_TABLET | Freq: Every day | ORAL | Status: DC
  Administered 2022-02-08: 40 mg via ORAL
  Filled 2022-02-08: qty 2

## 2022-02-08 MED ORDER — ALBUTEROL SULFATE HFA 108 (90 BASE) MCG/ACT IN AERS: 2.0000 | INHALATION_SPRAY | Freq: Four times a day (QID) | RESPIRATORY_TRACT | 0 refills | Status: DC | PRN

## 2022-02-08 MED ORDER — FUROSEMIDE 40 MG PO TABS: 20.0000 mg | ORAL_TABLET | Freq: Every day | ORAL | 0 refills | Status: DC

## 2022-02-08 MED ORDER — ALPRAZOLAM 0.25 MG PO TABS: 0.2500 mg | ORAL_TABLET | Freq: Two times a day (BID) | ORAL | 0 refills | Status: DC | PRN

## 2022-02-08 MED ORDER — PREDNISONE 20 MG PO TABS: 40.0000 mg | ORAL_TABLET | Freq: Every day | ORAL | Status: DC

## 2022-02-08 MED ORDER — PREDNISONE 20 MG PO TABS
ORAL_TABLET | ORAL | 0 refills | Status: AC
Start: 2022-02-09 — End: 2022-02-18

## 2022-02-08 MED ORDER — FUROSEMIDE 20 MG PO TABS: 20.0000 mg | ORAL_TABLET | Freq: Every day | ORAL | Status: DC

## 2022-02-08 NOTE — Progress Notes (Signed)
Nurse printed off education material relating to patient diagnoses and gave to patients daughter as she had repeated questions about why her mother continued to cough up mucous and complain of not being able to breath. Nurse was called to room three times regarding the above, patient was not noted in distress and sats were in the high 90's, patient was still verbal and drinking from a straw during one of the reported episodes. Nurse educated patient and daughter that the productive cough and feeling of SOB was symptoms of her current diagnoses.

## 2022-02-08 NOTE — Progress Notes (Addendum)
[name]       Patient ID: Cathy Spears MRN: [id number] DOB/AGE: [birth year] 86 y.o.  Admit date: 02/02/2022 Referring Physician Dr. Roosevelt Locks Primary Physician Dr. Emily Filbert Primary Cardiologist Dr. Saralyn Pilar Reason for Consultation AF RVR  HPI: Cathy Spears is an 89yoF with a PMH of paroxysmal atrial fibrillation (initially noted postoperatively 12/2018), hypertension, history of paroxysmal SVT, hyperlipidemia, COPD, GERD, chronic cough who presented to [facility] ED 02/02/2022 with a COPD exacerbation secondary to parainfluenza virus.  She was recently admitted for 2 days and discharged on 7/4 after having coughing and choking and was treated for diastolic CHF and A-fib with RVR. Cardiology is consulted on hospital day 5 for assistance with her atrial fibrillation.  Interval History: - no acute events -Seen ambulating without difficulty and without supplemental oxygen from bathroom to her recliner unassisted  -Feels somewhat better than yesterday, still feels this choking sensation when she coughs, is relieved with sips of water and also using the incentive spirometer. -No chest pain or palpitations  Review of systems complete and found to be negative unless listed above     Past Medical History:  Diagnosis Date   Anxiety    Breast cancer ([place])    Cancer ([place])    ,bilateral lymph nodes removed  breast mostly from the left   Cataracts, bilateral    Complication of anesthesia    Dysrhythmia    hx of AF   Esophageal erosions    GERD (gastroesophageal reflux disease)    Hypertension    dr Saralyn Pilar  at [facility]   Personal history of radiation therapy    PONV (postoperative nausea and vomiting)    spinal anesthesia x1    Past Surgical History:  Procedure Laterality Date   BALLOON DILATION N/A 09/24/2017   Procedure: BALLOON DILATION;  Surgeon: Toledo, Benay Pike, MD;  Location: [facility] ENDOSCOPY;  Service: Gastroenterology;  Laterality: N/A;    BREAST BIOPSY Right    BREAST LUMPECTOMY Bilateral    2010   BREAST SURGERY     lumpectomy bil,pt. states lymph nodes were removed and  the left arm is restricted   COLONOSCOPY WITH ESOPHAGOGASTRODUODENOSCOPY (EGD)     ESOPHAGOGASTRODUODENOSCOPY (EGD) WITH PROPOFOL N/A 09/24/2017   Procedure: ESOPHAGOGASTRODUODENOSCOPY (EGD) WITH PROPOFOL;  Surgeon: Toledo, Benay Pike, MD;  Location: [facility] ENDOSCOPY;  Service: Gastroenterology;  Laterality: N/A;   HERNIA REPAIR     LUMBAR LAMINECTOMY/DECOMPRESSION MICRODISCECTOMY  08/22/2011   Procedure: LUMBAR LAMINECTOMY/DECOMPRESSION MICRODISCECTOMY;  Surgeon: Ophelia Charter, MD;  Location: [place] [facility]S;  Service: Neurosurgery;  Laterality: Right;  RIGHT Lumbar five sacral one  laminectomy and microdiscectomy   TOTAL KNEE ARTHROPLASTY Right 01/14/2019   Procedure: TOTAL KNEE ARTHROPLASTY RIGHT;  Surgeon: Corky Mull, MD;  Location: [facility] ORS;  Service: Orthopedics;  Laterality: Right;    Medications Prior to Admission  Medication Sig Dispense Refill Last Dose   apixaban (ELIQUIS) 5 MG TABS tablet Take 1 tablet (5 mg total) by mouth 2 (two) times daily. 60 tablet 0 02/01/2022   Calcium Carbonate-Vitamin D (TH CALCIUM CARBONATE-VITAMIN D) 600-400 MG-UNIT tablet Take 1 tablet by mouth 2 (two) times daily.   02/01/2022   cholecalciferol (VITAMIN D) 1000 UNITS tablet Take 1,000 Units by mouth daily.   02/01/2022   diltiazem (CARDIZEM CD) 120 MG 24 hr capsule Take 1 capsule (120 mg total) by mouth daily. 30 capsule 0 02/01/2022   furosemide (LASIX) 40 MG tablet Take 1 tablet (40 mg total) by mouth  daily. 30 tablet 0 02/01/2022   guaiFENesin (MUCINEX) 600 MG 12 hr tablet Take 1 tablet (600 mg total) by mouth 2 (two) times daily as needed for up to 14 days for to loosen phlegm. 28 tablet 0 Past Week   metoprolol (LOPRESSOR) 50 MG tablet Take 50 mg by mouth 2 (two) times daily.   02/01/2022   Multiple Vitamins-Minerals (PRESERVISION AREDS PO) Take 1 tablet by mouth 2 (two) times  daily.   02/01/2022   omeprazole (PRILOSEC) 40 MG capsule Take 40 mg by mouth 2 (two) times a day.    02/01/2022   potassium chloride SA (KLOR-CON M) 20 MEQ tablet Take 1 tablet (20 mEq total) by mouth daily. Take one tablet (20 mEq) with your FUROSEMIDE dose on days when you are taking it. 30 tablet 0 02/01/2022   vitamin B-12 (CYANOCOBALAMIN) 1000 MCG tablet Take 1,000 mcg by mouth daily.   02/01/2022   Social History   Socioeconomic History   Marital status: Married    Spouse name: Not on file   Number of children: Not on file   Years of education: Not on file   Highest education level: Not on file  Occupational History   Not on file  Tobacco Use   Smoking status: Never   Smokeless tobacco: Never  Vaping Use   Vaping Use: Never used  Substance and Sexual Activity   Alcohol use: No   Drug use: No   Sexual activity: Not on file  Other Topics Concern   Not on file  Social History Narrative   Not on file   Social Determinants of Health   Financial Resource Strain: Not on file  Food Insecurity: Not on file  Transportation Needs: Not on file  Physical Activity: Not on file  Stress: Not on file  Social Connections: Not on file  Intimate Partner Violence: Not on file    History reviewed. No pertinent family history.    PHYSICAL EXAM General: Elderly Caucasian female, well nourished, in no acute distress.  Sitting upright in recliner with her daughter at bedside HEENT:  Normocephalic and atraumatic.  No stridor. Neck:  No JVD.  Lungs: Normal respiratory effort on room air. Clear bilaterally to auscultation. No wheezes, crackles, rhonchi.  Heart: Irregularly irregular rhythm with controlled rate. Normal S1 and S2 without gallops or murmurs. Abdomen: Obese appearing.  Msk: Normal strength and tone for age. Extremities: Warm and well perfused. No clubbing, cyanosis.  Trace bilateral lower extremity edema.  Neuro: Alert and oriented X 3. Psych:  Answers questions appropriately.    Labs:   Lab Results  Component Value Date   WBC 9.1 02/08/2022   HGB 13.5 02/08/2022   HCT 42.0 02/08/2022   MCV 80.5 02/08/2022   PLT 285 02/08/2022    Recent Labs  Lab 02/07/22 1157  NA 135  K 4.1  CL 96*  CO2 27  BUN 35*  CREATININE 0.73  CALCIUM 9.2  GLUCOSE 195*    Lab Results  Component Value Date   TROPONINI <0.03 01/15/2019   No results found for: "CHOL" No results found for: "HDL" No results found for: "LDLCALC" No results found for: "TRIG" No results found for: "CHOLHDL" No results found for: "LDLDIRECT"    Radiology: DG Abd 1 View  Result Date: 02/06/2022 CLINICAL DATA:  Abdominal distension EXAM: ABDOMEN - 1 VIEW COMPARISON:  None Available. FINDINGS: There is no evidence of bowel obstruction. There is a moderate to large stool burden. There is gaseous distension  of the stomach. Degenerative changes spine. No acute osseous abnormality. Mild bilateral hip osteoarthritis. Degenerative changes of the symphysis pubis. IMPRESSION: No evidence of bowel obstruction. Moderate to large stool burden. Gaseous distention of the stomach. Electronically Signed   By: Maurine Simmering M.D.   On: 02/06/2022 13:04   DG Chest Port 1 View  Result Date: 02/03/2022 CLINICAL DATA:  Dyspnea EXAM: PORTABLE CHEST 1 VIEW COMPARISON:  02/01/2022 FINDINGS: Cardiac shadow is enlarged but stable. Lungs are well aerated bilaterally. No focal infiltrate or sizable effusion is seen. Degenerative changes about shoulder joints are noted. IMPRESSION: Cardiomegaly stable in appearance. No acute abnormality noted. Electronically Signed   By: Inez Catalina M.D.   On: 02/03/2022 21:52   CT Angio Chest Pulmonary Embolism (PE) W or WO Contrast  Result Date: 02/02/2022 CLINICAL DATA:  Pulmonary embolism (PE) suspected, high prob EXAM: CT ANGIOGRAPHY CHEST WITH CONTRAST TECHNIQUE: Multidetector CT imaging of the chest was performed using the standard protocol during bolus administration of intravenous  contrast. Multiplanar CT image reconstructions and MIPs were obtained to evaluate the vascular anatomy. RADIATION DOSE REDUCTION: This exam was performed according to the departmental dose-optimization program which includes automated exposure control, adjustment of the mA and/or kV according to patient size and/or use of iterative reconstruction technique. CONTRAST:  52m OMNIPAQUE IOHEXOL 350 MG/ML SOLN COMPARISON:  None Available. FINDINGS: Cardiovascular: Satisfactory opacification of the pulmonary arteries to the segmental level. No evidence of pulmonary embolism. Enlarged heart size. No pericardial effusion. Enlarged main pulmonary artery. Aortic atherosclerosis. Coronary artery atherosclerosis. Calcification of the mitral valve. Mediastinum/Nodes: No enlarged mediastinal, hilar, or axillary lymph nodes. Thyroid gland, trachea, and esophagus demonstrate no significant findings. Lungs/Pleura: Mild subsegmental atelectasis. No consolidation. No sizable pleural effusions. No pneumothorax. Upper Abdomen: No acute abnormality. Musculoskeletal: No chest wall abnormality. No acute or significant osseous findings. Review of the MIP images confirms the above findings. IMPRESSION: 1. No evidence of acute pulmonary embolism. 2. Cardiomegaly. 3. Mild subsegmental atelectasis. Enlarged main pulmonary artery which indicates pulmonary arterial hypertension. 4. Mild subsegmental atelectasis. 5.  Aortic Atherosclerosis (ICD10-I70.0). Electronically Signed   By: FMargaretha SheffieldM.D.   On: 02/02/2022 12:29   DG Chest 2 View  Result Date: 02/01/2022 CLINICAL DATA:  Short of breath since yesterday EXAM: CHEST - 2 VIEW COMPARISON:  01/27/2022 FINDINGS: Frontal and lateral views of the chest demonstrates stable cardiac silhouette. No acute airspace disease, effusion, or pneumothorax. Stable hyperinflation. No acute bony abnormalities. IMPRESSION: 1. Stable enlarged cardiac silhouette.  No acute airspace disease. Electronically  Signed   By: MRanda NgoM.D.   On: 02/01/2022 20:47   ECHOCARDIOGRAM COMPLETE  Result Date: 01/28/2022    ECHOCARDIOGRAM REPORT   Patient Name:   HAHMANI DAOUDDate of Exam: 01/28/2022 Medical Rec #:  [id number]        Height:       62.0 in Accession #:    [id number]       Weight:       185.0 lb Date of Birth:  1Apr 08, 1937        BSA:          1.849 m Patient Age:    89years          BP:           108/63 mmHg Patient Gender: F                 HR:  94 bpm. Exam Location:  [facility] Procedure: 2D Echo, Cardiac Doppler and Color Doppler Indications:     CHF-acute diastolic K27.06  History:         Patient has prior history of Echocardiogram examinations, most                  recent 01/15/2019. Risk Factors:Hypertension.  Sonographer:     Sherrie Sport Referring Phys:  [id number] JAN A MANSY Diagnosing Phys: Donnelly Angelica  Sonographer Comments: Technically challenging study due to limited acoustic windows and suboptimal apical window. IMPRESSIONS  1. Left ventricular ejection fraction, by estimation, is 55 to 60%. The left ventricle has normal function. The left ventricle has no regional wall motion abnormalities. There is mild left ventricular hypertrophy. Left ventricular diastolic parameters are indeterminate.  2. Right ventricular systolic function is mildly reduced. The right ventricular size is not well visualized but probably at least moderately dilated.  3. Left atrial size was severely dilated.  4. Right atrial size was moderately dilated.  5. The mitral valve is normal in structure. No evidence of mitral valve regurgitation. No evidence of mitral stenosis. The mean mitral valve gradient is 2.0 mmHg.  6. The aortic valve was not well visualized. Aortic valve regurgitation is not visualized. Aortic valve sclerosis is present, with no evidence of aortic valve stenosis.  7. The inferior vena cava is normal in size with greater than 50% respiratory variability, suggesting right atrial pressure of 3 mmHg.  Conclusion(s)/Recommendation(s): Technically difficult study. RV looks at least moderately dilated with mildly decreased function. IVC is small and compressible indicating normal RA pressure. FINDINGS  Left Ventricle: Left ventricular ejection fraction, by estimation, is 55 to 60%. The left ventricle has normal function. The left ventricle has no regional wall motion abnormalities. The left ventricular internal cavity size was normal in size. There is  mild left ventricular hypertrophy. Left ventricular diastolic parameters are indeterminate. Right Ventricle: The right ventricular size is not well visualized but probably at least moderately dilated. Right vetricular wall thickness was not well visualized. Right ventricular systolic function is mildly reduced. Left Atrium: Left atrial size was severely dilated. Right Atrium: Right atrial size was moderately dilated. Pericardium: There is no evidence of pericardial effusion. Mitral Valve: The mitral valve is normal in structure. Mild mitral annular calcification. No evidence of mitral valve regurgitation. No evidence of mitral valve stenosis. MV peak gradient, 4.4 mmHg. The mean mitral valve gradient is 2.0 mmHg. Tricuspid Valve: The tricuspid valve is not well visualized. Tricuspid valve regurgitation is trivial. Aortic Valve: The aortic valve was not well visualized. Aortic valve regurgitation is not visualized. Aortic valve sclerosis is present, with no evidence of aortic valve stenosis. Aortic valve mean gradient measures 3.3 mmHg. Aortic valve peak gradient measures 6.2 mmHg. Aortic valve area, by VTI measures 2.19 cm. Pulmonic Valve: The pulmonic valve was not well visualized. Aorta: The aortic root and ascending aorta are structurally normal, with no evidence of dilitation. Venous: The inferior vena cava is normal in size with greater than 50% respiratory variability, suggesting right atrial pressure of 3 mmHg. IAS/Shunts: The interatrial septum was not well  visualized.  LEFT VENTRICLE PLAX 2D LVIDd:         4.30 cm   Diastology LVIDs:         3.20 cm   LV e' medial:    9.14 cm/s LV PW:         1.40 cm   LV E/e' medial:  11.1 LV IVS:  1.20 cm   LV e' lateral:   10.60 cm/s LVOT diam:     2.00 cm   LV E/e' lateral: 9.5 LV SV:         45 LV SV Index:   24 LVOT Area:     3.14 cm  RIGHT VENTRICLE RV S prime:     12.60 cm/s TAPSE (M-mode): 1.4 cm LEFT ATRIUM              Index        RIGHT ATRIUM           Index LA diam:        4.90 cm  2.65 cm/m   RA Area:     24.20 cm LA Vol (A2C):   122.0 ml 65.97 ml/m  RA Volume:   82.90 ml  44.83 ml/m LA Vol (A4C):   109.0 ml 58.94 ml/m LA Biplane Vol: 123.0 ml 66.51 ml/m  AORTIC VALVE AV Area (Vmax):    2.02 cm AV Area (Vmean):   1.99 cm AV Area (VTI):     2.19 cm AV Vmax:           124.67 cm/s AV Vmean:          87.367 cm/s AV VTI:            0.207 m AV Peak Grad:      6.2 mmHg AV Mean Grad:      3.3 mmHg LVOT Vmax:         80.30 cm/s LVOT Vmean:        55.300 cm/s LVOT VTI:          0.144 m LVOT/AV VTI ratio: 0.70  AORTA Ao Root diam: 3.40 cm MITRAL VALVE                TRICUSPID VALVE MV Area (PHT): 5.16 cm     TR Peak grad:   27.2 mmHg MV Area VTI:   2.34 cm     TR Vmax:        261.00 cm/s MV Peak grad:  4.4 mmHg MV Mean grad:  2.0 mmHg     SHUNTS MV Vmax:       1.05 m/s     Systemic VTI:  0.14 m MV Vmean:      64.2 cm/s    Systemic Diam: 2.00 cm MV Decel Time: 147 msec MV E velocity: 101.00 cm/s Donnelly Angelica Electronically signed by Donnelly Angelica Signature Date/Time: 01/28/2022/1:07:36 PM    Final    DG Chest 2 View  Result Date: 01/27/2022 CLINICAL DATA:  Shortness of breath. EXAM: CHEST - 2 VIEW COMPARISON:  January 26, 2022 FINDINGS: There is stable mild to moderate severity enlargement of the cardiac silhouette. There is marked severity calcification of the aortic arch. Stable, diffusely increased interstitial lung markings are noted. Mild prominence of the pulmonary vasculature is seen. There is no evidence of focal  consolidation, pleural effusion or pneumothorax. Multilevel degenerative changes are seen throughout the thoracic spine. IMPRESSION: 1. Stable cardiomegaly with mild pulmonary vascular congestion. Electronically Signed   By: Virgina Norfolk M.D.   On: 01/27/2022 19:23   DG Chest 2 View  Result Date: 01/26/2022 CLINICAL DATA:  Per Triage: Pt via POV from home. Pt c/o dry cough that started last weekend. States that last couple of days she has been coughing non-stop.Denies any chest pain. Denies any SOB. Denies fever. Pt is AANDOX4 and NAD EXAM: CHEST - 2 VIEW COMPARISON:  01/16/2019.  CT, 01/14/2019. FINDINGS: Stable enlargement of the cardiac silhouette. No mediastinal or hilar masses. No evidence of adenopathy. Prominent interstitial markings most evident in the bases, also stable. No evidence of pneumonia or pulmonary edema. No pleural effusion or pneumothorax. Skeletal structures are grossly intact. IMPRESSION: 1. No acute cardiopulmonary disease. Electronically Signed   By: Lajean Manes M.D.   On: 01/26/2022 14:14     TELEMETRY reviewed by me: Atrial fibrillation, mostly rate controlled with some paroxysms of RVR starting 7/11 with rates peaking at in the 150s, and 7/12 some RVR in the 110s to 120s, returning to the 90s during interview.  EKG reviewed by me: Atrial fibrillation rate 86  ASSESSMENT AND PLAN:  Cathy Spears is an 39yoF with a PMH of paroxysmal atrial fibrillation (initially noted postoperatively 12/2018), hypertension, history of paroxysmal SVT, hyperlipidemia, COPD, GERD, chronic cough who presented to [facility] ED 02/02/2022 with a COPD exacerbation secondary to parainfluenza virus.  She was recently admitted for 2 days and discharged on 7/4 after having coughing and choking and was treated for diastolic CHF and A-fib with RVR. Cardiology is consulted on hospital day 5 for assistance with her atrial fibrillation.  #Parainfluenza virus infection #COPD exacerbation #Paroxysmal atrial  fibrillation with RVR The patient presented on 7/8 with coughing and choking spells and shortness of breath, was found to have a parainfluenza virus on admission and is being treated for a COPD exacerbation with IV steroids and also was started on albuterol 7/12.  She initially had a controlled heart rate but developed RVR on day of admission treated with diltiazem gtt. for short period of time. I had a long discussion with the patient and her daughter by phone and in-law at bedside on the possibility of starting an antiarrhythmic (amiodarone) to see if she feels better (less choking sensation) when she is back into sinus rhythm, although this is unlikely the cause of her choking sensation. She continues to strongly wants to avoid this due to side effects.   -Discussed again today supportive care for her underlying cold symptoms and choking sensation, using incentive spirometer when she feels this sensation coming on. -Agree with supportive care for underlying viral infection and COPD exacerbation -Decrease use of albuterol as able, the patient was not wheezing on exam today -No changes to her rate control meds as her heart rate is mostly well controlled in atrial fibrillation -continue metoprolol at 50 mg twice daily, diltiazem CD120 mg -Continue Eliquis 5 mg twice daily for stroke risk reduction (CHA2DS2-VASc 4) -Decrease Lasix 40 mg back to 20 mg daily, discussed fluid restriction of 64 ounces per day and weighing daily -Renal function, K+ and Mg+ within normal limits yesterday -No further recommendations from a cardiac standpoint.  This patient's plan of care was discussed and created with Dr. Nehemiah Massed and he is in agreement.  Signed: Tristan Schroeder , PA-C 02/08/2022, 10:03 AM [facility]  The patient has been interviewed and examined. I agree with assessment and plan above. Serafina Royals MD [facility]

## 2022-02-08 NOTE — Plan of Care (Signed)
  Problem: Education: Goal: Knowledge of General Education information will improve Description: Including pain rating scale, medication(s)/side effects and non-pharmacologic comfort measures Outcome: Progressing   Problem: Health Behavior/Discharge Planning: Goal: Ability to manage health-related needs will improve Outcome: Progressing   Problem: Clinical Measurements: Goal: Ability to maintain clinical measurements within normal limits will improve Outcome: Progressing Goal: Will remain free from infection Outcome: Progressing Goal: Diagnostic test results will improve Outcome: Progressing Goal: Respiratory complications will improve Outcome: Progressing Goal: Cardiovascular complication will be avoided Outcome: Progressing   Problem: Activity: Goal: Risk for activity intolerance will decrease Outcome: Progressing   Problem: Nutrition: Goal: Adequate nutrition will be maintained Outcome: Progressing   Problem: Coping: Goal: Level of anxiety will decrease Outcome: Not Progressing Note: Patient and family continue to be anxious about patient having a productive cough with thick mucous that causes patient to have a labored breathing feeling.   Problem: Elimination: Goal: Will not experience complications related to bowel motility Outcome: Progressing Goal: Will not experience complications related to urinary retention Outcome: Progressing   Problem: Pain Managment: Goal: General experience of comfort will improve Outcome: Progressing   Problem: Safety: Goal: Ability to remain free from injury will improve Outcome: Progressing   Problem: Skin Integrity: Goal: Risk for impaired skin integrity will decrease Outcome: Progressing

## 2022-02-08 NOTE — Discharge Summary (Signed)
Physician Discharge Summary   Patient: Cathy Spears MRN: [id number] DOB: [birth year]  Admit date:     02/02/2022  Discharge date: 02/08/22  Discharge Physician: Sharen Hones   PCP: Rusty Aus, MD   Recommendations at discharge:   Follow-up with PCP in 1 week. Follow-up with me in 1 week. Follow-up with cardiology in 1 week.  Discharge Diagnoses: Principal Problem:   Paroxysmal atrial fibrillation with rapid ventricular response (HCC) Active Problems:   COPD with acute exacerbation (HCC)   Chronic diastolic CHF (congestive heart failure) (HCC)   Anxiety   Hyponatremia   COPD exacerbation (HCC)   Parainfluenza infection   Obesity (BMI 30-39.9)  Resolved Problems:   * No resolved hospital problems. *  Hospital Course: Cathy Spears is a 86 y.o. female with medical history significant for A-fib on Eliquis, HTN, breast cancer, GERD, recent hospitalization from 7/2 to 7/4 for CHF and possible laryngeal spasm with normal flex laryngoscopy, who returns to the ED with a complaint of shortness of breath that started the day prior associated with cough productive of white phlegm.  Upon arriving the hospital, patient was found to have paroxysmal atrial fibrillation with rapid ventricular response, started on diltiazem drip, continued on apixaban. Patient was also placed on steroids for COPD exacerbation. CT scan ruled out PE.  Assessment and Plan: Paroxysmal atrial fibrillation with rapid ventricular response. Patient is seen by cardiology, heart rate much better controlled.  No need to adjust the medication at this time.  Continue anticoagulation, patient be followed up with cardiology in 1 week.  COPD exacerbation. Parainfluenza infection. Pulmonary hypertension. Patient still complaining of short of breath with cough, overall condition improving.  She was tried to use incentive spirometer after cough, which she has decreased the symptoms short of breath.  She is medically  stable to be discharged.  We will continue steroid taper, incentive spirometer.  Follow-up with Dr. Lanney Gins in 1 week     Chronic diastolic congestive heart failure. Decrease the dose of furosemide to 20 mg daily, may resume back to 40 mg after 2 or 3 days.  Patient appears to have dry airway, will try if decrease Lasix will help.   Obesity with BMI 32.34. Diet and exercise       Consultants: Cardiology, pulmonology. Procedures performed: None  Disposition: Home Diet recommendation:  Discharge Diet Orders (From admission, onward)     Start     Ordered   02/08/22 0000  Diet - low sodium heart healthy        02/08/22 1524           Cardiac diet DISCHARGE MEDICATION: Allergies as of 02/08/2022       Reactions   Fish-derived Products Swelling   Blue Fish years ago.  Caused redness over all of face   Oxycodone Swelling        Medication List     TAKE these medications    albuterol 108 (90 Base) MCG/ACT inhaler Commonly known as: VENTOLIN HFA Inhale 2 puffs into the lungs every 6 (six) hours as needed for wheezing or shortness of breath.   ALPRAZolam 0.25 MG tablet Commonly known as: XANAX Take 1 tablet (0.25 mg total) by mouth 2 (two) times daily as needed for anxiety.   apixaban 5 MG Tabs tablet Commonly known as: ELIQUIS Take 1 tablet (5 mg total) by mouth 2 (two) times daily.   cholecalciferol 1000 units tablet Commonly known as: VITAMIN D Take 1,000 Units by mouth daily.  diltiazem 120 MG 24 hr capsule Commonly known as: CARDIZEM CD Take 1 capsule (120 mg total) by mouth daily.   furosemide 40 MG tablet Commonly known as: LASIX Take 0.5 tablets (20 mg total) by mouth daily. May increase to 40 mg after 2 days What changed:  how much to take additional instructions   guaiFENesin 600 MG 12 hr tablet Commonly known as: Mucinex Take 1 tablet (600 mg total) by mouth 2 (two) times daily as needed for up to 14 days for to loosen phlegm.    metoprolol tartrate 50 MG tablet Commonly known as: LOPRESSOR Take 50 mg by mouth 2 (two) times daily.   omeprazole 40 MG capsule Commonly known as: PRILOSEC Take 40 mg by mouth 2 (two) times a day.   potassium chloride SA 20 MEQ tablet Commonly known as: KLOR-CON M Take 1 tablet (20 mEq total) by mouth daily. Take one tablet (20 mEq) with your FUROSEMIDE dose on days when you are taking it.   predniSONE 20 MG tablet Commonly known as: DELTASONE Take 2 tablets (40 mg total) by mouth daily with breakfast for 3 days, THEN 1 tablet (20 mg total) daily with breakfast for 3 days, THEN 0.5 tablets (10 mg total) daily with breakfast for 3 days. Start taking on: February 09, 2022   PRESERVISION AREDS PO Take 1 tablet by mouth 2 (two) times daily.   TH Calcium Carbonate-Vitamin D 600-400 MG-UNIT tablet Generic drug: Calcium Carbonate-Vitamin D Take 1 tablet by mouth 2 (two) times daily.   vitamin B-12 1000 MCG tablet Commonly known as: CYANOCOBALAMIN Take 1,000 mcg by mouth daily.        Follow-up Information     Rusty Aus, MD Follow up in 1 week(s).   Specialty: Internal Medicine Contact information: [place] [address] [phone]         Ottie Glazier, MD Follow up in 1 week(s).   Specialty: Pulmonary Disease Contact information: [place] [address] [phone]         Isaias Cowman, MD Follow up in 1 week(s).   Specialty: Cardiology Contact information: [facility] [place] [address] [phone]                Discharge Exam: Danley Danker Weights   02/06/22 2147 02/07/22 0500 02/08/22 0400  Weight: 80.4 kg 79.4 kg 79.4 kg   General exam: Appears calm and comfortable  Respiratory system: Clear to auscultation. Respiratory effort normal. Cardiovascular system: Irregular.  No JVD, murmurs, rubs, gallops or clicks. No  pedal edema. Gastrointestinal system: Abdomen is nondistended, soft and nontender. No organomegaly or masses felt. Normal bowel sounds heard. Central nervous system: Alert and oriented. No focal neurological deficits. Extremities: Symmetric 5 x 5 power. Skin: No rashes, lesions or ulcers Psychiatry: Judgement and insight appear normal. Mood & affect appropriate.    Condition at discharge: good  The results of significant diagnostics from this hospitalization (including imaging, microbiology, ancillary and laboratory) are listed below for reference.   Imaging Studies: DG Abd 1 View  Result Date: 02/06/2022 CLINICAL DATA:  Abdominal distension EXAM: ABDOMEN - 1 VIEW COMPARISON:  None Available. FINDINGS: There is no evidence of bowel obstruction. There is a moderate to large stool burden. There is gaseous distension of the stomach. Degenerative changes spine. No acute osseous abnormality. Mild bilateral hip osteoarthritis. Degenerative changes of the symphysis pubis. IMPRESSION: No evidence of bowel obstruction. Moderate to  large stool burden. Gaseous distention of the stomach. Electronically Signed   By: Maurine Simmering M.D.   On: 02/06/2022 13:04   DG Chest Port 1 View  Result Date: 02/03/2022 CLINICAL DATA:  Dyspnea EXAM: PORTABLE CHEST 1 VIEW COMPARISON:  02/01/2022 FINDINGS: Cardiac shadow is enlarged but stable. Lungs are well aerated bilaterally. No focal infiltrate or sizable effusion is seen. Degenerative changes about shoulder joints are noted. IMPRESSION: Cardiomegaly stable in appearance. No acute abnormality noted. Electronically Signed   By: Inez Catalina M.D.   On: 02/03/2022 21:52   CT Angio Chest Pulmonary Embolism (PE) W or WO Contrast  Result Date: 02/02/2022 CLINICAL DATA:  Pulmonary embolism (PE) suspected, high prob EXAM: CT ANGIOGRAPHY CHEST WITH CONTRAST TECHNIQUE: Multidetector CT imaging of the chest was performed using the standard protocol during bolus administration of  intravenous contrast. Multiplanar CT image reconstructions and MIPs were obtained to evaluate the vascular anatomy. RADIATION DOSE REDUCTION: This exam was performed according to the departmental dose-optimization program which includes automated exposure control, adjustment of the mA and/or kV according to patient size and/or use of iterative reconstruction technique. CONTRAST:  84m OMNIPAQUE IOHEXOL 350 MG/ML SOLN COMPARISON:  None Available. FINDINGS: Cardiovascular: Satisfactory opacification of the pulmonary arteries to the segmental level. No evidence of pulmonary embolism. Enlarged heart size. No pericardial effusion. Enlarged main pulmonary artery. Aortic atherosclerosis. Coronary artery atherosclerosis. Calcification of the mitral valve. Mediastinum/Nodes: No enlarged mediastinal, hilar, or axillary lymph nodes. Thyroid gland, trachea, and esophagus demonstrate no significant findings. Lungs/Pleura: Mild subsegmental atelectasis. No consolidation. No sizable pleural effusions. No pneumothorax. Upper Abdomen: No acute abnormality. Musculoskeletal: No chest wall abnormality. No acute or significant osseous findings. Review of the MIP images confirms the above findings. IMPRESSION: 1. No evidence of acute pulmonary embolism. 2. Cardiomegaly. 3. Mild subsegmental atelectasis. Enlarged main pulmonary artery which indicates pulmonary arterial hypertension. 4. Mild subsegmental atelectasis. 5.  Aortic Atherosclerosis (ICD10-I70.0). Electronically Signed   By: FMargaretha SheffieldM.D.   On: 02/02/2022 12:29   DG Chest 2 View  Result Date: 02/01/2022 CLINICAL DATA:  Short of breath since yesterday EXAM: CHEST - 2 VIEW COMPARISON:  01/27/2022 FINDINGS: Frontal and lateral views of the chest demonstrates stable cardiac silhouette. No acute airspace disease, effusion, or pneumothorax. Stable hyperinflation. No acute bony abnormalities. IMPRESSION: 1. Stable enlarged cardiac silhouette.  No acute airspace disease.  Electronically Signed   By: MRanda NgoM.D.   On: 02/01/2022 20:47   ECHOCARDIOGRAM COMPLETE  Result Date: 01/28/2022    ECHOCARDIOGRAM REPORT   Patient Name:   HSAMON DISHNERDate of Exam: 01/28/2022 Medical Rec #:  [id number]        Height:       62.0 in Accession #:    [id number]       Weight:       185.0 lb Date of Birth:  108/07/1935        BSA:          1.849 m Patient Age:    89years          BP:           108/63 mmHg Patient Gender: F                 HR:           94 bpm. Exam Location:  [facility] Procedure: 2D Echo, Cardiac Doppler and Color Doppler Indications:     CHF-acute diastolic IZ76.73 History:  Patient has prior history of Echocardiogram examinations, most                  recent 01/15/2019. Risk Factors:Hypertension.  Sonographer:     Sherrie Sport Referring Phys:  [id number] JAN A MANSY Diagnosing Phys: Donnelly Angelica  Sonographer Comments: Technically challenging study due to limited acoustic windows and suboptimal apical window. IMPRESSIONS  1. Left ventricular ejection fraction, by estimation, is 55 to 60%. The left ventricle has normal function. The left ventricle has no regional wall motion abnormalities. There is mild left ventricular hypertrophy. Left ventricular diastolic parameters are indeterminate.  2. Right ventricular systolic function is mildly reduced. The right ventricular size is not well visualized but probably at least moderately dilated.  3. Left atrial size was severely dilated.  4. Right atrial size was moderately dilated.  5. The mitral valve is normal in structure. No evidence of mitral valve regurgitation. No evidence of mitral stenosis. The mean mitral valve gradient is 2.0 mmHg.  6. The aortic valve was not well visualized. Aortic valve regurgitation is not visualized. Aortic valve sclerosis is present, with no evidence of aortic valve stenosis.  7. The inferior vena cava is normal in size with greater than 50% respiratory variability, suggesting right atrial pressure  of 3 mmHg. Conclusion(s)/Recommendation(s): Technically difficult study. RV looks at least moderately dilated with mildly decreased function. IVC is small and compressible indicating normal RA pressure. FINDINGS  Left Ventricle: Left ventricular ejection fraction, by estimation, is 55 to 60%. The left ventricle has normal function. The left ventricle has no regional wall motion abnormalities. The left ventricular internal cavity size was normal in size. There is  mild left ventricular hypertrophy. Left ventricular diastolic parameters are indeterminate. Right Ventricle: The right ventricular size is not well visualized but probably at least moderately dilated. Right vetricular wall thickness was not well visualized. Right ventricular systolic function is mildly reduced. Left Atrium: Left atrial size was severely dilated. Right Atrium: Right atrial size was moderately dilated. Pericardium: There is no evidence of pericardial effusion. Mitral Valve: The mitral valve is normal in structure. Mild mitral annular calcification. No evidence of mitral valve regurgitation. No evidence of mitral valve stenosis. MV peak gradient, 4.4 mmHg. The mean mitral valve gradient is 2.0 mmHg. Tricuspid Valve: The tricuspid valve is not well visualized. Tricuspid valve regurgitation is trivial. Aortic Valve: The aortic valve was not well visualized. Aortic valve regurgitation is not visualized. Aortic valve sclerosis is present, with no evidence of aortic valve stenosis. Aortic valve mean gradient measures 3.3 mmHg. Aortic valve peak gradient measures 6.2 mmHg. Aortic valve area, by VTI measures 2.19 cm. Pulmonic Valve: The pulmonic valve was not well visualized. Aorta: The aortic root and ascending aorta are structurally normal, with no evidence of dilitation. Venous: The inferior vena cava is normal in size with greater than 50% respiratory variability, suggesting right atrial pressure of 3 mmHg. IAS/Shunts: The interatrial septum was  not well visualized.  LEFT VENTRICLE PLAX 2D LVIDd:         4.30 cm   Diastology LVIDs:         3.20 cm   LV e' medial:    9.14 cm/s LV PW:         1.40 cm   LV E/e' medial:  11.1 LV IVS:        1.20 cm   LV e' lateral:   10.60 cm/s LVOT diam:     2.00 cm   LV E/e' lateral: 9.5  LV SV:         45 LV SV Index:   24 LVOT Area:     3.14 cm  RIGHT VENTRICLE RV S prime:     12.60 cm/s TAPSE (M-mode): 1.4 cm LEFT ATRIUM              Index        RIGHT ATRIUM           Index LA diam:        4.90 cm  2.65 cm/m   RA Area:     24.20 cm LA Vol (A2C):   122.0 ml 65.97 ml/m  RA Volume:   82.90 ml  44.83 ml/m LA Vol (A4C):   109.0 ml 58.94 ml/m LA Biplane Vol: 123.0 ml 66.51 ml/m  AORTIC VALVE AV Area (Vmax):    2.02 cm AV Area (Vmean):   1.99 cm AV Area (VTI):     2.19 cm AV Vmax:           124.67 cm/s AV Vmean:          87.367 cm/s AV VTI:            0.207 m AV Peak Grad:      6.2 mmHg AV Mean Grad:      3.3 mmHg LVOT Vmax:         80.30 cm/s LVOT Vmean:        55.300 cm/s LVOT VTI:          0.144 m LVOT/AV VTI ratio: 0.70  AORTA Ao Root diam: 3.40 cm MITRAL VALVE                TRICUSPID VALVE MV Area (PHT): 5.16 cm     TR Peak grad:   27.2 mmHg MV Area VTI:   2.34 cm     TR Vmax:        261.00 cm/s MV Peak grad:  4.4 mmHg MV Mean grad:  2.0 mmHg     SHUNTS MV Vmax:       1.05 m/s     Systemic VTI:  0.14 m MV Vmean:      64.2 cm/s    Systemic Diam: 2.00 cm MV Decel Time: 147 msec MV E velocity: 101.00 cm/s Donnelly Angelica Electronically signed by Donnelly Angelica Signature Date/Time: 01/28/2022/1:07:36 PM    Final    DG Chest 2 View  Result Date: 01/27/2022 CLINICAL DATA:  Shortness of breath. EXAM: CHEST - 2 VIEW COMPARISON:  January 26, 2022 FINDINGS: There is stable mild to moderate severity enlargement of the cardiac silhouette. There is marked severity calcification of the aortic arch. Stable, diffusely increased interstitial lung markings are noted. Mild prominence of the pulmonary vasculature is seen. There is no evidence  of focal consolidation, pleural effusion or pneumothorax. Multilevel degenerative changes are seen throughout the thoracic spine. IMPRESSION: 1. Stable cardiomegaly with mild pulmonary vascular congestion. Electronically Signed   By: Virgina Norfolk M.D.   On: 01/27/2022 19:23   DG Chest 2 View  Result Date: 01/26/2022 CLINICAL DATA:  Per Triage: Pt via POV from home. Pt c/o dry cough that started last weekend. States that last couple of days she has been coughing non-stop.Denies any chest pain. Denies any SOB. Denies fever. Pt is AANDOX4 and NAD EXAM: CHEST - 2 VIEW COMPARISON:  01/16/2019.  CT, 01/14/2019. FINDINGS: Stable enlargement of the cardiac silhouette. No mediastinal or hilar masses. No evidence of adenopathy. Prominent interstitial markings most evident in  the bases, also stable. No evidence of pneumonia or pulmonary edema. No pleural effusion or pneumothorax. Skeletal structures are grossly intact. IMPRESSION: 1. No acute cardiopulmonary disease. Electronically Signed   By: Lajean Manes M.D.   On: 01/26/2022 14:14    Microbiology: Results for orders placed or performed during the hospital encounter of 02/02/22  Respiratory (~20 pathogens) panel by PCR     Status: Abnormal   Collection Time: 02/03/22  3:05 PM   Specimen: Nasopharyngeal Swab; Respiratory  Result Value Ref Range Status   Adenovirus NOT DETECTED NOT DETECTED Final   Coronavirus 229E NOT DETECTED NOT DETECTED Final    Comment: (NOTE) The Coronavirus on the Respiratory Panel, DOES NOT test for the novel  Coronavirus (2019 nCoV)    Coronavirus HKU1 NOT DETECTED NOT DETECTED Final   Coronavirus NL63 NOT DETECTED NOT DETECTED Final   Coronavirus OC43 NOT DETECTED NOT DETECTED Final   Metapneumovirus NOT DETECTED NOT DETECTED Final   Rhinovirus / Enterovirus NOT DETECTED NOT DETECTED Final   Influenza A NOT DETECTED NOT DETECTED Final   Influenza B NOT DETECTED NOT DETECTED Final   Parainfluenza Virus 1 DETECTED (A) NOT  DETECTED Final   Parainfluenza Virus 2 NOT DETECTED NOT DETECTED Final   Parainfluenza Virus 3 NOT DETECTED NOT DETECTED Final   Parainfluenza Virus 4 NOT DETECTED NOT DETECTED Final   Respiratory Syncytial Virus NOT DETECTED NOT DETECTED Final   Bordetella pertussis NOT DETECTED NOT DETECTED Final   Bordetella Parapertussis NOT DETECTED NOT DETECTED Final   Chlamydophila pneumoniae NOT DETECTED NOT DETECTED Final   Mycoplasma pneumoniae NOT DETECTED NOT DETECTED Final    Comment: Performed at [facility], [address], [place], [place] [address]    Labs: CBC: Recent Labs  Lab 02/01/22 2010 02/03/22 2025 02/06/22 0422 02/08/22 0423  WBC 7.3 9.6 9.5 9.1  HGB 12.7 13.2 13.9 13.5  HCT 39.1 40.6 43.2 42.0  MCV 79.8* 80.2 80.7 80.5  PLT 270 282 320 035   Basic Metabolic Panel: Recent Labs  Lab 02/01/22 2010 02/03/22 2025 02/07/22 1157  NA 132* 137 135  K 3.8 4.1 4.1  CL 95* 99 96*  CO2 '27 28 27  '$ GLUCOSE 107* 172* 195*  BUN 25* 28* 35*  CREATININE 0.78 0.97 0.73  CALCIUM 8.8* 9.0 9.2  MG  --  2.1 2.6*   Liver Function Tests: No results for input(s): "AST", "ALT", "ALKPHOS", "BILITOT", "PROT", "ALBUMIN" in the last 168 hours. CBG: No results for input(s): "GLUCAP" in the last 168 hours.  Discharge time spent: greater than 30 minutes.  Signed: Sharen Hones, MD [facility] 02/08/2022

## 2022-02-08 NOTE — Progress Notes (Signed)
AVS reviewed with pt and her two daughters. Understanding of all education/instructions verbalized. LDA removal complete. Pt dressed, to be transported home with daughter.

## 2022-02-08 NOTE — Care Management Important Message (Signed)
Important Message  Patient Details  Name: PAMELIA BOTTO MRN: [id number] Date of Birth: 1936/02/15   Medicare Important Message Given:  Yes     Dannette Barbara 02/08/2022, 11:40 AM

## 2022-02-10 NOTE — Progress Notes (Deleted)
   Patient ID: JENILLE LASZLO, female    DOB: 02-01-1936, 86 y.o.   MRN: [id number]  HPI  Ms Hurley is a 86 y/o female with a history of  Echo report from 01/28/22 reviewed and showed an EF of 55-60% along with mild LVH and severe LAE.   Admitted 02/02/22 due to shortness of breath along with cough and found to be in AF RVR. Cardiology consult obtained. Started on diltiazem drip and continued on eliquis. Given steroids for COPD exacerbation. CT scan ruled out PE. Discharged after 6 days. Admitted 01/27/22 due to worsening cough with occasional inability to breathe as well as wheezing. While she was in the ER she was witnessed to have "spasms with significant anxiety and difficulty breathing however with normal pulse oximetry. Cardiology & ENT consults obtained. Given IV lasix and IV decadron. S/p laryngoscope which was normal as per ENT. Speech evaluated pt. Discharged after 2 days.   She presents today for her initial visit with a chief complaint of   Review of Systems    Physical Exam    Assessment & Plan:  1: Chronic heart failure with preserved ejection fraction- - NYHA class - BNP 02/01/22 was 127.4  2: HTN- - BP - saw PCP Sabra Heck) 12/05/21 - BMP 02/07/22 reviewed and showed sodium 135, potassium 4.1, creatinine 0.73 and GFR >60  3: PAF- - saw cardiology Margarito Courser) 01/31/22  4: COPD/ Pulmonary HTN- - sees pulmonology Lanney Gins) 02/28/22

## 2022-02-13 ENCOUNTER — Ambulatory Visit: Payer: Medicare HMO | Admitting: Family

## 2022-02-26 ENCOUNTER — Encounter: Payer: Self-pay | Admitting: Family

## 2022-02-26 ENCOUNTER — Ambulatory Visit: Payer: Medicare HMO | Attending: Family | Admitting: Family

## 2022-02-26 VITALS — BP 117/69 | HR 90 | Resp 18 | Ht 61.0 in | Wt 175.0 lb

## 2022-02-26 DIAGNOSIS — I480 Paroxysmal atrial fibrillation: Secondary | ICD-10-CM | POA: Diagnosis not present

## 2022-02-26 DIAGNOSIS — I272 Pulmonary hypertension, unspecified: Secondary | ICD-10-CM | POA: Diagnosis not present

## 2022-02-26 DIAGNOSIS — R5383 Other fatigue: Secondary | ICD-10-CM | POA: Insufficient documentation

## 2022-02-26 DIAGNOSIS — K219 Gastro-esophageal reflux disease without esophagitis: Secondary | ICD-10-CM | POA: Diagnosis not present

## 2022-02-26 DIAGNOSIS — I5032 Chronic diastolic (congestive) heart failure: Secondary | ICD-10-CM | POA: Insufficient documentation

## 2022-02-26 DIAGNOSIS — J449 Chronic obstructive pulmonary disease, unspecified: Secondary | ICD-10-CM

## 2022-02-26 DIAGNOSIS — I10 Essential (primary) hypertension: Secondary | ICD-10-CM | POA: Diagnosis not present

## 2022-02-26 DIAGNOSIS — Z7901 Long term (current) use of anticoagulants: Secondary | ICD-10-CM | POA: Insufficient documentation

## 2022-02-26 DIAGNOSIS — I110 Hypertensive heart disease with heart failure: Secondary | ICD-10-CM | POA: Insufficient documentation

## 2022-02-26 NOTE — Patient Instructions (Addendum)
Continue weighing daily and call for an overnight weight gain of 3 pounds or more or a weekly weight gain of more than 5 pounds.   If you have voicemail, please make sure your mailbox is cleaned out so that we may leave a message and please make sure to listen to any voicemails.    Drink 60-64 ounces of fluid daily

## 2022-02-26 NOTE — Progress Notes (Signed)
Patient ID: Cathy Spears, female    DOB: [birth year], 86 y.o.   MRN: [id number]  HPI  Cathy Spears is a 86 y/o female with a history of breast cancer, HTN, anxiety, atrial fibrillation, COPD, GERD and chronic heart failure.   Echo report from 01/28/22 reviewed and showed an EF of 55-60% along with mild LVH and severe LAE.   Admitted 02/02/22 due to shortness of breath along with cough and found to be in AF RVR. Cardiology consult obtained. Started on diltiazem drip and continued on eliquis. Given steroids for COPD exacerbation. CT scan ruled out PE. Discharged after 6 days. Admitted 01/27/22 due to worsening cough with occasional inability to breathe as well as wheezing. While she was in the ER she was witnessed to have "spasms with significant anxiety and difficulty breathing however with normal pulse oximetry. Cardiology & ENT consults obtained. Given IV lasix and IV decadron. S/p laryngoscope which was normal as per ENT. Speech evaluated pt. Discharged after 2 days.   She presents today for her initial visit with a chief complaint of minimal fatigue with moderate exertion. Describes this as chronic in nature. She has associated cough, shortness of breath (improving) and pedal edema along with this. She denies any dizziness, difficulty sleeping, abdominal distention, palpitations, chest pain or weight gain.   Not adding salt to her food and does not eat out. Has been reading food labels for sodium content.   Past Medical History:  Diagnosis Date   Anxiety    Atrial fibrillation ([place])    Breast cancer ([place])    Cancer ([place])    ,bilateral lymph nodes removed  breast mostly from the left   Cataracts, bilateral    CHF (congestive heart failure) (HCC)    Complication of anesthesia    COPD (chronic obstructive pulmonary disease) (HCC)    Dysrhythmia    hx of AF   Esophageal erosions    GERD (gastroesophageal reflux disease)    Hypertension    dr Saralyn Pilar  at [facility] clinic   Personal  history of radiation therapy    PONV (postoperative nausea and vomiting)    spinal anesthesia x1   Past Surgical History:  Procedure Laterality Date   BALLOON DILATION N/A 09/24/2017   Procedure: BALLOON DILATION;  Surgeon: Toledo, Benay Pike, MD;  Location: [facility] ENDOSCOPY;  Service: Gastroenterology;  Laterality: N/A;   BREAST BIOPSY Right    BREAST LUMPECTOMY Bilateral    2010   BREAST SURGERY     lumpectomy bil,pt. states lymph nodes were removed and  the left arm is restricted   COLONOSCOPY WITH ESOPHAGOGASTRODUODENOSCOPY (EGD)     ESOPHAGOGASTRODUODENOSCOPY (EGD) WITH PROPOFOL N/A 09/24/2017   Procedure: ESOPHAGOGASTRODUODENOSCOPY (EGD) WITH PROPOFOL;  Surgeon: Toledo, Benay Pike, MD;  Location: [facility] ENDOSCOPY;  Service: Gastroenterology;  Laterality: N/A;   HERNIA REPAIR     LUMBAR LAMINECTOMY/DECOMPRESSION MICRODISCECTOMY  08/22/2011   Procedure: LUMBAR LAMINECTOMY/DECOMPRESSION MICRODISCECTOMY;  Surgeon: Ophelia Charter, MD;  Location: [place] NEURO ORS;  Service: Neurosurgery;  Laterality: Right;  RIGHT Lumbar five sacral one  laminectomy and microdiscectomy   TOTAL KNEE ARTHROPLASTY Right 01/14/2019   Procedure: TOTAL KNEE ARTHROPLASTY RIGHT;  Surgeon: Corky Mull, MD;  Location: [facility] ORS;  Service: Orthopedics;  Laterality: Right;   History reviewed. No pertinent family history. Social History   Tobacco Use   Smoking status: Never   Smokeless tobacco: Never  Substance Use Topics   Alcohol use: No   Allergies  Allergen Reactions   Fish-Derived  Products Swelling    Blue Fish years ago.  Caused redness over all of face   Oxycodone Swelling   Prior to Admission medications   Medication Sig Start Date End Date Taking? Authorizing Provider  apixaban (ELIQUIS) 5 MG TABS tablet Take 1 tablet (5 mg total) by mouth 2 (two) times daily. 01/29/22 02/28/22 Yes [name], MD  Calcium Carbonate-Vitamin D (TH CALCIUM CARBONATE-VITAMIN D) 600-400 MG-UNIT tablet Take 1 tablet by mouth  2 (two) times daily.   Yes [provider]  cholecalciferol (VITAMIN D) 1000 UNITS tablet Take 1,000 Units by mouth daily.   Yes [provider]  diltiazem (CARDIZEM CD) 120 MG 24 hr capsule Take 1 capsule (120 mg total) by mouth daily. 01/17/19  Yes Reche Dixon, PA-C  furosemide (LASIX) 20 MG tablet Take 20 mg by mouth daily.   Yes [provider]  metoprolol (LOPRESSOR) 50 MG tablet Take 50 mg by mouth 2 (two) times daily.   Yes [provider]  Multiple Vitamins-Minerals (PRESERVISION AREDS PO) Take 1 tablet by mouth 2 (two) times daily.   Yes [provider]  omeprazole (PRILOSEC) 40 MG capsule Take 40 mg by mouth 2 (two) times a day.    Yes [provider]  potassium chloride SA (KLOR-CON M) 20 MEQ tablet Take 1 tablet (20 mEq total) by mouth daily. Take one tablet (20 mEq) with your FUROSEMIDE dose on days when you are taking it. 01/29/22 02/28/22 Yes [name], MD  vitamin B-12 (CYANOCOBALAMIN) 1000 MCG tablet Take 1,000 mcg by mouth daily.   Yes [provider]   Review of Systems  Constitutional:  Positive for fatigue (improving). Negative for appetite change.  HENT:  Negative for congestion, postnasal drip and sore throat.   Eyes: Negative.   Respiratory:  Positive for cough and shortness of breath (improving). Negative for chest tightness.   Cardiovascular:  Positive for leg swelling. Negative for chest pain and palpitations.  Gastrointestinal:  Negative for abdominal distention and abdominal pain.  Endocrine: Negative.   Genitourinary: Negative.   Musculoskeletal:  Negative for back pain and neck pain.  Allergic/Immunologic: Negative.   Neurological:  Negative for dizziness and light-headedness.  Hematological:  Negative for adenopathy. Does not bruise/bleed easily.  Psychiatric/Behavioral:  Negative for dysphoric mood and sleep disturbance (sleeping on 1 pillow). The patient is not nervous/anxious.    Vitals:    02/26/22 1559  BP: 117/69  Pulse: 90  Resp: 18  SpO2: 96%  Weight: 175 lb (79.4 kg)  Height: '5\' 1"'$  (1.549 m)   Wt Readings from Last 3 Encounters:  02/26/22 175 lb (79.4 kg)  02/08/22 175 lb 0.7 oz (79.4 kg)  01/29/22 183 lb 9.6 oz (83.3 kg)   Lab Results  Component Value Date   CREATININE 0.73 02/07/2022   CREATININE 0.97 02/03/2022   CREATININE 0.78 02/01/2022   Physical Exam Vitals and nursing note reviewed. Exam conducted with a chaperone present (daughter).  Constitutional:      Appearance: Normal appearance.  HENT:     Head: Normocephalic and atraumatic.  Cardiovascular:     Rate and Rhythm: Normal rate. Rhythm irregular.  Pulmonary:     Effort: Pulmonary effort is normal. No respiratory distress.     Breath sounds: No wheezing or rales.  Abdominal:     General: There is no distension.     Palpations: Abdomen is soft.  Musculoskeletal:        General: No tenderness.  Cervical back: Normal range of motion and neck supple.     Right lower leg: No edema.     Left lower leg: No edema.  Skin:    General: Skin is warm and dry.  Neurological:     General: No focal deficit present.     Mental Status: She is alert and oriented to person, place, and time.  Psychiatric:        Mood and Affect: Mood normal.        Thought Content: Thought content normal.    Assessment & Plan:  1: Chronic heart failure with preserved ejection fraction with structural changes (LVH/LAE)- - NYHA class II - euvolemic today - weighing daily; reminded to call for an overnight weight gain of > 2 pounds or a weekly weight gain of > 5 pounds - not adding salt and has been looking at food labels for sodium content; does not eat out - unsure of fluid intake as she doesn't know how many ounces her cup holds; instructed to use a measuring cup to see how many ounces the cup holds so that she can drink 60-64 ounces daily - becoming more active - consider adding entresto/SGLT2 at next visit -  BNP 02/01/22 was 127.4  2: HTN- - BP looks good (117/69) - saw PCP Sabra Heck) 12/05/21 - BMP 02/07/22 reviewed and showed sodium 135, potassium 4.1, creatinine 0.73 and GFR >60  3: PAF- - saw cardiology Margarito Courser) 01/31/22 - irregular rhythm   4: COPD/ Pulmonary HTN- - sees pulmonology Lanney Gins) 02/28/22   Medication bottles reviewed.   Return in 6 weeks, sooner if needed.

## 2022-03-07 ENCOUNTER — Other Ambulatory Visit: Payer: Self-pay | Admitting: Internal Medicine

## 2022-03-07 DIAGNOSIS — Z1231 Encounter for screening mammogram for malignant neoplasm of breast: Secondary | ICD-10-CM

## 2022-03-19 ENCOUNTER — Encounter: Payer: Self-pay | Admitting: Family

## 2022-03-20 ENCOUNTER — Telehealth: Payer: Self-pay | Admitting: Family

## 2022-03-20 NOTE — Telephone Encounter (Signed)
Patient's daughter called to say that she had talked to her mom (the patient) and her weight today was unchanged from yesterday (176.4 pounds) and the patient had gone ahead and taken an additional '20mg'$  furosemide.   Daughter has noticed that patient has a slight cough and minimal swelling around her ankles. Advised daughter to have patient take an additional 22mq potassium today since she took extra diuretic.   Daughter said that patient didn't drink much fluids yesterday so reviewed keeping her daily fluid intake to 60-64 ounces. Patient is cooking at home and not cooking with salt.   Daughter will call [facility] tomorrow to update. She verbalized understanding of the additional potassium tablet

## 2022-03-22 ENCOUNTER — Telehealth: Payer: Self-pay | Admitting: Family

## 2022-03-22 MED ORDER — POTASSIUM CHLORIDE CRYS ER 20 MEQ PO TBCR: 20.0000 meq | EXTENDED_RELEASE_TABLET | ORAL | 0 refills | Status: DC

## 2022-03-22 NOTE — Telephone Encounter (Signed)
Patient's daughter called to say that her weight had dropped a couple of pounds but then went back up a pound. Still has some edema around her ankles and the dry cough which is concerning to them. Denies any SOB and is doing her PT at home.   Will increase her furosemide to '20mg'$  QOD alternating with '40mg'$  QOD. Will also change her potassium to 21mq QOD alternating with 453m QOD. Will plan on checking lab work at her next visit in 1 month.   Advised daughter to call back if symptoms continue. She verbalized understanding.

## 2022-03-25 ENCOUNTER — Telehealth: Payer: Self-pay | Admitting: Family

## 2022-03-25 NOTE — Telephone Encounter (Signed)
Patient's daughter called to say that patient's ankles appear more swollen but that her cough is stable. No change is respiratory status. Is now taking alternating doses of diuretic/ potassium and has been doing this for the last 3 days since we last talked.   No change in diet or fluid intake. Has gained 8 pounds in the last month.   Will continue QOD dosing and see patient in clinic next week. Daughter instructed to call back if anything changes prior to the appointment next week and she was comfortable with this plan.

## 2022-04-01 NOTE — Progress Notes (Signed)
Patient ID: Cathy Spears, female    DOB: 1935-09-03, 86 y.o.   MRN: [id number]  HPI  Cathy Spears is a 86 y/o female with a history of breast cancer, HTN, anxiety, atrial fibrillation, COPD, GERD and chronic heart failure.   Echo report from 01/28/22 reviewed and showed an EF of 55-60% along with mild LVH and severe LAE.   Admitted 02/02/22 due to shortness of breath along with cough and found to be in AF RVR. Cardiology consult obtained. Started on diltiazem drip and continued on eliquis. Given steroids for COPD exacerbation. CT scan ruled out PE. Discharged after 6 days. Admitted 01/27/22 due to worsening cough with occasional inability to breathe as well as wheezing. While she was in the ER she was witnessed to have "spasms with significant anxiety and difficulty breathing however with normal pulse oximetry. Cardiology & ENT consults obtained. Given IV lasix and IV decadron. S/p laryngoscope which was normal as per ENT. Speech evaluated pt. Discharged after 2 days.   She presents today for a follow-up visit with a chief complaint of minimal fatigue upon moderate exertion. She says this is chronic in nature although she does fee like it's improving. She has associated cough, pedal edema and gradual weight gain along with this. She denies any dizziness, difficulty sleeping, abdominal distention, palpitations, chest pain or shortness of breath.   She has been taking alternating furosemide/potassium doses and feels like her swelling is better. She also feels like her cough is better.   Past Medical History:  Diagnosis Date   Anxiety    Atrial fibrillation ([place])    Breast cancer ([place])    Cancer ([place])    ,bilateral lymph nodes removed  breast mostly from the left   Cataracts, bilateral    CHF (congestive heart failure) (HCC)    Complication of anesthesia    COPD (chronic obstructive pulmonary disease) (HCC)    Dysrhythmia    hx of AF   Esophageal erosions    GERD (gastroesophageal reflux  disease)    Hypertension    dr Saralyn Pilar  at [facility] clinic   Personal history of radiation therapy    PONV (postoperative nausea and vomiting)    spinal anesthesia x1   Past Surgical History:  Procedure Laterality Date   BALLOON DILATION N/A 09/24/2017   Procedure: BALLOON DILATION;  Surgeon: Toledo, Benay Pike, MD;  Location: [facility] ENDOSCOPY;  Service: Gastroenterology;  Laterality: N/A;   BREAST BIOPSY Right    BREAST LUMPECTOMY Bilateral    2010   BREAST SURGERY     lumpectomy bil,pt. states lymph nodes were removed and  the left arm is restricted   COLONOSCOPY WITH ESOPHAGOGASTRODUODENOSCOPY (EGD)     ESOPHAGOGASTRODUODENOSCOPY (EGD) WITH PROPOFOL N/A 09/24/2017   Procedure: ESOPHAGOGASTRODUODENOSCOPY (EGD) WITH PROPOFOL;  Surgeon: Toledo, Benay Pike, MD;  Location: [facility] ENDOSCOPY;  Service: Gastroenterology;  Laterality: N/A;   HERNIA REPAIR     LUMBAR LAMINECTOMY/DECOMPRESSION MICRODISCECTOMY  08/22/2011   Procedure: LUMBAR LAMINECTOMY/DECOMPRESSION MICRODISCECTOMY;  Surgeon: Ophelia Charter, MD;  Location: [place] NEURO ORS;  Service: Neurosurgery;  Laterality: Right;  RIGHT Lumbar five sacral one  laminectomy and microdiscectomy   TOTAL KNEE ARTHROPLASTY Right 01/14/2019   Procedure: TOTAL KNEE ARTHROPLASTY RIGHT;  Surgeon: Corky Mull, MD;  Location: [facility] ORS;  Service: Orthopedics;  Laterality: Right;   No family history on file. Social History   Tobacco Use   Smoking status: Never   Smokeless tobacco: Never  Substance Use Topics   Alcohol use:  No   Allergies  Allergen Reactions   Fish-Derived Products Swelling    Blue Fish years ago.  Caused redness over all of face   Oxycodone Swelling   Prior to Admission medications   Medication Sig Start Date End Date Taking? Authorizing Provider  Calcium Carbonate-Vitamin D (TH CALCIUM CARBONATE-VITAMIN D) 600-400 MG-UNIT tablet Take 1 tablet by mouth 2 (two) times daily.   Yes [provider]  cholecalciferol (VITAMIN  D) 1000 UNITS tablet Take 1,000 Units by mouth daily.   Yes [provider]  diltiazem (CARDIZEM CD) 120 MG 24 hr capsule Take 1 capsule (120 mg total) by mouth daily. 01/17/19  Yes Reche Dixon, PA-C  furosemide (LASIX) 20 MG tablet Take 20 mg by mouth every other day. Alternating with '40mg'$  QOD   Yes [provider]  metoprolol (LOPRESSOR) 50 MG tablet Take 50 mg by mouth 2 (two) times daily.   Yes [provider]  Multiple Vitamins-Minerals (PRESERVISION AREDS PO) Take 1 tablet by mouth 2 (two) times daily.   Yes [provider]  omeprazole (PRILOSEC) 40 MG capsule Take 40 mg by mouth 2 (two) times a day.    Yes [provider]  vitamin B-12 (CYANOCOBALAMIN) 1000 MCG tablet Take 1,000 mcg by mouth daily.   Yes [provider]  apixaban (ELIQUIS) 5 MG TABS tablet Take 1 tablet (5 mg total) by mouth 2 (two) times daily. 01/29/22 02/28/22  [name], MD  potassium chloride SA (KLOR-CON M) 20 MEQ tablet Take 1 tablet (20 mEq total) by mouth every other day. Alternating with 110mq every other day 04/02/22   HAlisa Graff FNP   Review of Systems  Constitutional:  Positive for fatigue (improving). Negative for appetite change.  HENT:  Negative for congestion, postnasal drip and sore throat.   Eyes: Negative.   Respiratory:  Positive for cough (dry and intermittent). Negative for chest tightness and shortness of breath.   Cardiovascular:  Positive for leg swelling. Negative for chest pain and palpitations.  Gastrointestinal:  Negative for abdominal distention and abdominal pain.  Endocrine: Negative.   Genitourinary: Negative.   Musculoskeletal:  Negative for back pain and neck pain.  Skin: Negative.   Allergic/Immunologic: Negative.   Neurological:  Negative for dizziness and light-headedness.  Hematological:  Negative for adenopathy. Does not bruise/bleed easily.  Psychiatric/Behavioral:  Negative for dysphoric mood and sleep disturbance  (sleeping on 1 pillow). The patient is not nervous/anxious.    Vitals:   04/02/22 1211  BP: (!) 122/58  Pulse: 79  Resp: 18  SpO2: 98%  Weight: 181 lb 2 oz (82.2 kg)  Height: '5\' 3"'$  (1.6 m)   Wt Readings from Last 3 Encounters:  04/02/22 181 lb 2 oz (82.2 kg)  02/26/22 175 lb (79.4 kg)  02/08/22 175 lb 0.7 oz (79.4 kg)   Lab Results  Component Value Date   CREATININE 0.60 04/02/2022   CREATININE 0.73 02/07/2022   CREATININE 0.97 02/03/2022   Physical Exam Vitals and nursing note reviewed. Exam conducted with a chaperone present (daughter).  Constitutional:      Appearance: Normal appearance.  HENT:     Head: Normocephalic and atraumatic.  Cardiovascular:     Rate and Rhythm: Normal rate. Rhythm irregular.  Pulmonary:     Effort: Pulmonary effort is normal. No respiratory distress.     Breath sounds: No wheezing or rales.  Abdominal:     General: There is no distension.     Palpations: Abdomen is  soft.  Musculoskeletal:        General: No tenderness.     Cervical back: Normal range of motion and neck supple.     Right lower leg: Edema (1+ pitting) present.     Left lower leg: No edema.  Skin:    General: Skin is warm and dry.  Neurological:     General: No focal deficit present.     Mental Status: She is alert and oriented to person, place, and time.  Psychiatric:        Mood and Affect: Mood normal.        Thought Content: Thought content normal.    Assessment & Plan:  1: Chronic heart failure with preserved ejection fraction with structural changes (LVH/LAE)- - NYHA class II - euvolemic today - weighing daily and home weight chart reviewed; reminded to call for an overnight weight gain of > 2 pounds or a weekly weight gain of > 5 pounds - weight up 6 pounds from last visit here 1 month ago - not adding salt and has been looking at food labels for sodium content; does not eat out - keeping her daily fluid intake to ~65 ounces - discussed GDMT of entresto/  SGLT2 and potential benefits of both; daughter that is present asks for information on these medications so she can take them home and discuss with her sister - has been taking QOD diuretic/potassium dosing with improvement in her edema - advised to get compression socks and put them on every morning with removal at bedtime - check BMP/ BNP today - BNP 02/01/22 was 127.4  2: HTN- - BP looks good (122/58) - saw PCP Sabra Heck) 12/05/21 - BMP 02/07/22 reviewed and showed sodium 135, potassium 4.1, creatinine 0.73 and GFR >60  3: PAF- - saw cardiology ([facility]as[place]) 03/12/22 - irregular rhythm   4: COPD/ Pulmonary HTN- - saw pulmonology Lanney Gins) 02/28/22   Medication list reviewed.   Return in 1 month, sooner if needed.

## 2022-04-02 ENCOUNTER — Encounter: Payer: Self-pay | Admitting: Family

## 2022-04-02 ENCOUNTER — Other Ambulatory Visit
Admission: RE | Admit: 2022-04-02 | Discharge: 2022-04-02 | Disposition: A | Discharge status: Home / Self Care | Payer: Medicare HMO | Source: Ambulatory Visit | Attending: Family | Admitting: Family

## 2022-04-02 ENCOUNTER — Ambulatory Visit (HOSPITAL_BASED_OUTPATIENT_CLINIC_OR_DEPARTMENT_OTHER): Payer: Medicare HMO | Admitting: Family

## 2022-04-02 VITALS — BP 122/58 | HR 79 | Resp 18 | Ht 63.0 in | Wt 181.1 lb

## 2022-04-02 DIAGNOSIS — I5032 Chronic diastolic (congestive) heart failure: Secondary | ICD-10-CM | POA: Insufficient documentation

## 2022-04-02 DIAGNOSIS — I480 Paroxysmal atrial fibrillation: Secondary | ICD-10-CM

## 2022-04-02 DIAGNOSIS — I10 Essential (primary) hypertension: Secondary | ICD-10-CM | POA: Diagnosis not present

## 2022-04-02 DIAGNOSIS — J449 Chronic obstructive pulmonary disease, unspecified: Secondary | ICD-10-CM | POA: Diagnosis not present

## 2022-04-02 LAB — BASIC METABOLIC PANEL
Anion gap: 8 (ref 5–15)
BUN: 21 mg/dL (ref 8–23)
CO2: 27 mmol/L (ref 22–32)
Calcium: 9.2 mg/dL (ref 8.9–10.3)
Chloride: 102 mmol/L (ref 98–111)
Creatinine, Ser: 0.6 mg/dL (ref 0.44–1.00)
GFR, Estimated: >60 mL/min (ref >60)
Glucose, Bld: 92 mg/dL (ref 70–99)
Potassium: 4 mmol/L (ref 3.5–5.1)
Sodium: 137 mmol/L (ref 135–145)

## 2022-04-02 LAB — BRAIN NATRIURETIC PEPTIDE: B Natriuretic Peptide: 265.8 pg/mL — ABNORMAL HIGH (ref 0.0–100.0)

## 2022-04-02 MED ORDER — POTASSIUM CHLORIDE CRYS ER 20 MEQ PO TBCR: 20.0000 meq | EXTENDED_RELEASE_TABLET | ORAL | 3 refills | Status: AC

## 2022-04-02 NOTE — Patient Instructions (Addendum)
Continue weighing daily and call for an overnight weight gain of 3 pounds or more or a weekly weight gain of more than 5 pounds.   If you have voicemail, please make sure your mailbox is cleaned out so that we may leave a message and please make sure to listen to any voicemails.    Discuss the use of jardiance and entresto

## 2022-04-16 ENCOUNTER — Ambulatory Visit
Admission: RE | Admit: 2022-04-16 | Discharge: 2022-04-16 | Disposition: A | Discharge status: Home / Self Care | Payer: Medicare HMO | Source: Ambulatory Visit | Attending: Internal Medicine | Admitting: Internal Medicine

## 2022-04-16 DIAGNOSIS — Z1231 Encounter for screening mammogram for malignant neoplasm of breast: Secondary | ICD-10-CM

## 2022-04-22 ENCOUNTER — Ambulatory Visit: Payer: Medicare HMO | Admitting: Family

## 2022-05-20 ENCOUNTER — Ambulatory Visit: Payer: Medicare HMO | Admitting: Family

## 2022-05-21 ENCOUNTER — Ambulatory Visit: Payer: Medicare HMO | Admitting: Family

## 2022-05-22 ENCOUNTER — Ambulatory Visit: Payer: Medicare HMO | Attending: Family | Admitting: Family

## 2022-05-22 ENCOUNTER — Encounter: Payer: Self-pay | Admitting: Family

## 2022-05-22 VITALS — BP 132/80 | HR 89 | Resp 20 | Ht 63.0 in | Wt 176.4 lb

## 2022-05-22 DIAGNOSIS — J441 Chronic obstructive pulmonary disease with (acute) exacerbation: Secondary | ICD-10-CM | POA: Insufficient documentation

## 2022-05-22 DIAGNOSIS — Z7901 Long term (current) use of anticoagulants: Secondary | ICD-10-CM | POA: Diagnosis not present

## 2022-05-22 DIAGNOSIS — I272 Pulmonary hypertension, unspecified: Secondary | ICD-10-CM | POA: Insufficient documentation

## 2022-05-22 DIAGNOSIS — R5383 Other fatigue: Secondary | ICD-10-CM | POA: Diagnosis present

## 2022-05-22 DIAGNOSIS — Z853 Personal history of malignant neoplasm of breast: Secondary | ICD-10-CM | POA: Diagnosis not present

## 2022-05-22 DIAGNOSIS — I110 Hypertensive heart disease with heart failure: Secondary | ICD-10-CM | POA: Diagnosis not present

## 2022-05-22 DIAGNOSIS — J449 Chronic obstructive pulmonary disease, unspecified: Secondary | ICD-10-CM

## 2022-05-22 DIAGNOSIS — I5032 Chronic diastolic (congestive) heart failure: Secondary | ICD-10-CM

## 2022-05-22 DIAGNOSIS — I10 Essential (primary) hypertension: Secondary | ICD-10-CM | POA: Diagnosis not present

## 2022-05-22 DIAGNOSIS — K219 Gastro-esophageal reflux disease without esophagitis: Secondary | ICD-10-CM | POA: Diagnosis not present

## 2022-05-22 DIAGNOSIS — F419 Anxiety disorder, unspecified: Secondary | ICD-10-CM | POA: Insufficient documentation

## 2022-05-22 DIAGNOSIS — I480 Paroxysmal atrial fibrillation: Secondary | ICD-10-CM | POA: Insufficient documentation

## 2022-05-22 NOTE — Progress Notes (Unsigned)
Patient ID: Cathy Spears, female    DOB: [birth year], 85 y.o.   MRN: [id number]  HPI  Cathy Spears is a 86 y/o female with a history of breast cancer, HTN, anxiety, atrial fibrillation, COPD, GERD and chronic heart failure.   Echo report from 01/28/22 reviewed and showed an EF of 55-60% along with mild LVH and severe LAE.   Admitted 02/02/22 due to shortness of breath along with cough and found to be in AF RVR. Cardiology consult obtained. Started on diltiazem drip and continued on eliquis. Given steroids for COPD exacerbation. CT scan ruled out PE. Discharged after 6 days. Admitted 01/27/22 due to worsening cough with occasional inability to breathe as well as wheezing. While she was in the ER she was witnessed to have "spasms with significant anxiety and difficulty breathing however with normal pulse oximetry. Cardiology & ENT consults obtained. Given IV lasix and IV decadron. S/p laryngoscope which was normal as per ENT. Speech evaluated pt. Discharged after 2 days.   She presents today for a follow-up visit with a chief complaint of minimal fatigue upon moderate exertion. Describes this as chronic in nature although much better. She has associated pedal edema along with this. She denies any difficulty sleeping, dizziness, cough, shortness of breath, chest pain, palpitations, abdominal distention or weight gain.   She has decided that she doesn't want to add SGLT2 at this time and is asking about decreasing her diuretic due to frequent urination as well as improvement of symptoms.   Past Medical History:  Diagnosis Date   Anxiety    Atrial fibrillation ([place])    Breast cancer ([place])    Cancer ([place])    ,bilateral lymph nodes removed  breast mostly from the left   Cataracts, bilateral    CHF (congestive heart failure) (H[facility])    Complication of anesthesia    COPD (chronic obstructive pulmonary disease) (HCC)    Dysrhythmia    hx of AF   Esophageal erosions    GERD (gastroesophageal reflux  disease)    Hypertension    dr Saralyn Pilar  at [facility]   Personal history of radiation therapy    PONV (postoperative nausea and vomiting)    spinal anesthesia x1   Past Surgical History:  Procedure Laterality Date   BALLOON DILATION N/A 09/24/2017   Procedure: BALLOON DILATION;  Surgeon: Toledo, Benay Pike, MD;  Location: [facility] ENDOSCOPY;  Service: Gastroenterology;  Laterality: N/A;   BREAST BIOPSY Right    BREAST LUMPECTOMY Bilateral    2010   BREAST SURGERY     lumpectomy bil,pt. states lymph nodes were removed and  the left arm is restricted   COLONOSCOPY WITH ESOPHAGOGASTRODUODENOSCOPY (EGD)     ESOPHAGOGASTRODUODENOSCOPY (EGD) WITH PROPOFOL N/A 09/24/2017   Procedure: ESOPHAGOGASTRODUODENOSCOPY (EGD) WITH PROPOFOL;  Surgeon: Toledo, Benay Pike, MD;  Location: [facility] ENDOSCOPY;  Service: Gastroenterology;  Laterality: N/A;   HERNIA REPAIR     LUMBAR LAMINECTOMY/DECOMPRESSION MICRODISCECTOMY  08/22/2011   Procedure: LUMBAR LAMINECTOMY/DECOMPRESSION MICRODISCECTOMY;  Surgeon: Ophelia Charter, MD;  Location: [place] [facility];  Service: Neurosurgery;  Laterality: Right;  RIGHT Lumbar five sacral one  laminectomy and microdiscectomy   TOTAL KNEE ARTHROPLASTY Right 01/14/2019   Procedure: TOTAL KNEE ARTHROPLASTY RIGHT;  Surgeon: Corky Mull, MD;  Location: [facility] ORS;  Service: Orthopedics;  Laterality: Right;   No family history on file. Social History   Tobacco Use   Smoking status: Never   Smokeless tobacco: Never  Substance Use Topics   Alcohol  use: No   Allergies  Allergen Reactions   Fish-Derived Products Swelling    Blue Fish years ago.  Caused redness over all of face   Oxycodone Swelling   Prior to Admission medications   Medication Sig Start Date End Date Taking? Authorizing Provider  apixaban (ELIQUIS) 5 MG TABS tablet Take 1 tablet (5 mg total) by mouth 2 (two) times daily. 01/29/22 02/28/22  [name], MD  Calcium Carbonate-Vitamin D (TH CALCIUM  CARBONATE-VITAMIN D) 600-400 MG-UNIT tablet Take 1 tablet by mouth 2 (two) times daily.    [provider]  cholecalciferol (VITAMIN D) 1000 UNITS tablet Take 1,000 Units by mouth daily.    [provider]  diltiazem (CARDIZEM CD) 120 MG 24 hr capsule Take 1 capsule (120 mg total) by mouth daily. 01/17/19   Reche Dixon, PA-C  furosemide (LASIX) 20 MG tablet Take 20 mg by mouth every other day. Alternating with '40mg'$  QOD    [provider]  metoprolol (LOPRESSOR) 50 MG tablet Take 50 mg by mouth 2 (two) times daily.    [provider]  Multiple Vitamins-Minerals (PRESERVISION AREDS PO) Take 1 tablet by mouth 2 (two) times daily.    [provider]  omeprazole (PRILOSEC) 40 MG capsule Take 40 mg by mouth 2 (two) times a day.     [provider]  potassium chloride SA (KLOR-CON M) 20 MEQ tablet Take 1 tablet (20 mEq total) by mouth every other day. Alternating with 76mq every other day 04/02/22   HAlisa Graff FNP  vitamin B-12 (CYANOCOBALAMIN) 1000 MCG tablet Take 1,000 mcg by mouth daily.    [provider]   Review of Systems  Constitutional:  Positive for fatigue (improving). Negative for appetite change.  HENT:  Negative for congestion, postnasal drip and sore throat.   Eyes: Negative.   Respiratory:  Negative for cough, chest tightness and shortness of breath.   Cardiovascular:  Positive for leg swelling ("better"). Negative for chest pain and palpitations.  Gastrointestinal:  Negative for abdominal distention and abdominal pain.  Endocrine: Negative.   Genitourinary: Negative.   Musculoskeletal:  Negative for back pain and neck pain.  Skin: Negative.   Allergic/Immunologic: Negative.   Neurological:  Negative for dizziness and light-headedness.  Hematological:  Negative for adenopathy. Does not bruise/bleed easily.  Psychiatric/Behavioral:  Negative for dysphoric mood and sleep disturbance (sleeping on 1 pillow). The patient  is not nervous/anxious.    Vitals:   05/22/22 1541  BP: 132/80  Pulse: 89  Resp: 20  SpO2: 96%  Weight: 176 lb 6 oz (80 kg)  Height: '5\' 3"'$  (1.6 m)   Wt Readings from Last 3 Encounters:  05/22/22 176 lb 6 oz (80 kg)  04/02/22 181 lb 2 oz (82.2 kg)  02/26/22 175 lb (79.4 kg)   Lab Results  Component Value Date   CREATININE 0.60 04/02/2022   CREATININE 0.73 02/07/2022   CREATININE 0.97 02/03/2022   Physical Exam Vitals and nursing note reviewed. Exam conducted with a chaperone present (daughter).  Constitutional:      Appearance: Normal appearance.  HENT:     Head: Normocephalic and atraumatic.  Cardiovascular:     Rate and Rhythm: Normal rate. Rhythm irregular.  Pulmonary:     Effort: Pulmonary effort is normal. No respiratory distress.     Breath sounds: No wheezing or rales.  Abdominal:     General: There is no distension.     Palpations: Abdomen is soft.  Musculoskeletal:  General: No tenderness.     Cervical back: Normal range of motion and neck supple.     Right lower leg: Edema (trace pitting) present.     Left lower leg: No edema.  Skin:    General: Skin is warm and dry.  Neurological:     General: No focal deficit present.     Mental Status: She is alert and oriented to person, place, and time.  Psychiatric:        Mood and Affect: Mood normal.        Thought Content: Thought content normal.    Assessment & Plan:  1: Chronic heart failure with preserved ejection fraction with structural changes (LVH/LAE)- - NYHA class II - euvolemic today - weighing daily and home weight chart reviewed; reminded to call for an overnight weight gain of > 2 pounds or a weekly weight gain of > 5 pounds - weight down 5 pounds from last visit here 6 weeks ago - not adding salt and has been looking at food labels for sodium content; does not eat out - keeping her daily fluid intake to ~65 ounces - not interested in GDMT at this time - will decrease her diuretic back  to '20mg'$  daily/ potassium 67mq daily; if she has above weight gain or worsening swelling, she can go back to 20/'40mg'$  QOD dosing - BNP 04/02/22 was 265.8  2: HTN- - BP mildly elevated (132/80) - saw PCP (Venetia Maxon 05/15/22 - BMP 04/02/22 reviewed and showed sodium 137, potassium 4.0, creatinine 0.6 and GFR >60  3: PAF- - saw cardiology (Paraschos) 04/04/22 - irregular rhythm   4: COPD/ Pulmonary HTN- - saw pulmonology (Lanney Gins 02/28/22   Medication list reviewed.   Return in 3 months, sooner if needed.

## 2022-05-22 NOTE — Patient Instructions (Signed)
Continue weighing daily and call for an overnight weight gain of 3 pounds or more or a weekly weight gain of more than 5 pounds.   If you have voicemail, please make sure your mailbox is cleaned out so that we may leave a message and please make sure to listen to any voicemails.     

## 2022-08-21 ENCOUNTER — Ambulatory Visit: Payer: Medicare HMO | Admitting: Family

## 2022-10-10 ENCOUNTER — Ambulatory Visit: Payer: Medicare HMO | Admitting: Family

## 2022-11-29 ENCOUNTER — Telehealth: Payer: Self-pay

## 2022-11-29 NOTE — Telephone Encounter (Signed)
Patient's daughter called with concerns of cough and swelling of feet and ankles. Spoke with clinic nurse and was advised to let patient know to alternate 40mg  of Furosemide QOD PRN and QOD PRN as noted on patients medication list. Follow-up appointment scheduled per patient request to see Cathy Spears.

## 2022-12-17 ENCOUNTER — Encounter: Payer: Medicare HMO | Admitting: Family

## 2023-04-03 ENCOUNTER — Other Ambulatory Visit: Payer: Self-pay | Admitting: Internal Medicine

## 2023-04-03 DIAGNOSIS — Z1231 Encounter for screening mammogram for malignant neoplasm of breast: Secondary | ICD-10-CM

## 2023-04-22 ENCOUNTER — Ambulatory Visit
Admission: RE | Admit: 2023-04-22 | Discharge: 2023-04-22 | Disposition: A | Discharge status: Home / Self Care | Payer: Medicare HMO | Source: Ambulatory Visit | Attending: Internal Medicine | Admitting: Internal Medicine

## 2023-04-22 DIAGNOSIS — Z1231 Encounter for screening mammogram for malignant neoplasm of breast: Secondary | ICD-10-CM

## 2023-08-26 NOTE — Progress Notes (Unsigned)
Advanced Heart Failure Clinic Note    PCP: Danella Penton, MD (last seen 12/24) Cardiologist: Marcina Millard, MD (last seen 11/24)  Chief Complaint: shortness of breath  HPI:  Ms Kalil is a 88 y/o female with a history of breast cancer, HTN, anxiety, paroxysmal atrial fibrillation, paroxysmal SVT, COPD, GERD and chronic heart failure.   Admitted 01/27/22 due to worsening cough with occasional inability to breathe as well as wheezing. While she was in the ER she was witnessed to have "spasms with significant anxiety and difficulty breathing however with normal pulse oximetry. Cardiology & ENT consults obtained. Given IV lasix and IV decadron. S/p laryngoscope which was normal as per ENT. Speech evaluated pt. Discharged after 2 days. Admitted 02/02/22 due to shortness of breath along with cough and found to be in AF RVR. Cardiology consult obtained. Started on diltiazem drip and continued on eliquis. Given steroids for COPD exacerbation. CT scan ruled out PE. Discharged after 6 days.   Echo 01/15/19: EF 60-65% with mild/ moderate LAE and mild RAE Echo 01/28/22: EF 55-60% along with mild LVH and severe LAE.   She presents, with her daughter, today for an acute HF visit with a chief complaint of worsening shortness of breath over the last 24 hours. She has associated 2 pound weight gain overnight, fatigue, chest tightness, cough, difficulty sleeping last night and abdominal distention along with this. Denies chest pain, palpitations or pedal edema. She took 2 lasix yesterday instead of 1 and she gained another 2 pounds.   Denies any change in diet/ fluid intake and hasn't missed any medications.   ROS: All systems negative except as listed in HPI, PMH and Problem list.    Past Medical History:  Diagnosis Date   Anxiety    Atrial fibrillation ([facility])    Breast cancer ([facility])    Cancer ([facility])    ,bilateral lymph nodes removed  breast mostly from the left   Cataracts, bilateral    CHF  (congestive heart failure) (HCC)    Complication of anesthesia    COPD (chronic obstructive pulmonary disease) (HCC)    Dysrhythmia    hx of AF   Esophageal erosions    GERD (gastroesophageal reflux disease)    Hypertension    dr Darrold Junker  at [facility] clinic   Personal history of radiation therapy    PONV (postoperative nausea and vomiting)    spinal anesthesia x1    Current Outpatient Medications  Medication Sig Dispense Refill   apixaban (ELIQUIS) 5 MG TABS tablet Take 1 tablet (5 mg total) by mouth 2 (two) times daily. 60 tablet 0   Calcium Carbonate-Vitamin D (TH CALCIUM CARBONATE-VITAMIN D) 600-400 MG-UNIT tablet Take 1 tablet by mouth 2 (two) times daily.     cholecalciferol (VITAMIN D) 1000 UNITS tablet Take 1,000 Units by mouth daily.     diltiazem (CARDIZEM CD) 120 MG 24 hr capsule Take 1 capsule (120 mg total) by mouth daily. 30 capsule 0   furosemide (LASIX) 20 MG tablet Take 20 mg by mouth daily. Alternating with 40mg  QOD PRN     metoprolol (LOPRESSOR) 50 MG tablet Take 50 mg by mouth 2 (two) times daily.     Multiple Vitamins-Minerals (PRESERVISION AREDS PO) Take 1 tablet by mouth 2 (two) times daily.     omeprazole (PRILOSEC) 40 MG capsule Take 40 mg by mouth 2 (two) times a day.      potassium chloride SA (KLOR-CON M) 20 MEQ tablet Take 1 tablet (20 mEq  total) by mouth every other day. Alternating with every other day (Patient taking differently: Take 20 mEq by mouth daily. Alternating with every other day PRN) 50 tablet 3   vitamin B-12 (CYANOCOBALAMIN) 1000 MCG tablet Take 1,000 mcg by mouth daily.     No current facility-administered medications for this visit.    Allergies  Allergen Reactions   Fish-Derived Products Swelling    Blue Fish years ago.  Caused redness over all of face   Oxycodone Swelling      Social History   Socioeconomic History   Marital status: Married    Spouse name: Not on file   Number of children: Not on file   Years  of education: Not on file   Highest education level: Not on file  Occupational History   Not on file  Tobacco Use   Smoking status: Never   Smokeless tobacco: Never  Vaping Use   Vaping status: Never Used  Substance and Sexual Activity   Alcohol use: No   Drug use: No   Sexual activity: Not on file  Other Topics Concern   Not on file  Social History Narrative   Not on file   Social Drivers of Health   Financial Resource Strain: Low Risk  (07/09/2023)   Received from [facility]   Overall Financial Resource Strain (CARDIA)    Difficulty of Paying Living Expenses: Not hard at all  Food Insecurity: No Food Insecurity (07/09/2023)   Received from [facility]   Hunger Vital Sign    Worried About Running Out of Food in the Last Year: Never true    Ran Out of Food in the Last Year: Never true  Transportation Needs: No Transportation Needs (07/09/2023)   Received from [facility]    In the past 12 months, has lack of transportation kept you from medical appointments or from getting medications?: No    Lack of Transportation (Non-Medical): No  Physical Activity: Not on file  Stress: Not on file  Social Connections: Not on file  Intimate Partner Violence: Not on file     No family history on file.  Vitals:   08/27/23 1325  BP: (!) 127/53  Pulse: (!) 52  SpO2: 97%  Weight: 174 lb (78.9 kg)   Wt Readings from Last 3 Encounters:  08/27/23 174 lb (78.9 kg)  05/22/22 176 lb 6 oz (80 kg)  04/02/22 181 lb 2 oz (82.2 kg)   Lab Results  Component Value Date   CREATININE 0.60 04/02/2022   CREATININE 0.73 02/07/2022   CREATININE 0.97 02/03/2022    PHYSICAL EXAM: General:  Well appearing. No respiratory difficulty HEENT: normal Neck: supple. no JVD. Carotids 2+ bilat; no bruits. No lymphadenopathy or thyromegaly appreciated. Cor: PMI nondisplaced. Bradycardic, irregular rhythm. No rubs, gallops  or murmurs. Lungs: clear Abdomen: soft, nontender, distended. No hepatosplenomegaly. No bruits or masses. Good bowel sounds. Extremities: no cyanosis, clubbing, rash, trace pitting edema bilateral lower legs Neuro: alert & oriented x 3, cranial nerves grossly intact. moves all 4 extremities w/o difficulty. Affect pleasant.  ECG: not done  ReDs reading: 35 %, normal although unclear of her baseline reading   ASSESSMENT & PLAN:  1: Chronic heart failure with preserved ejection fraction- - NYHA class II - minimally fluid up with weight gain and worsened symptoms - weighing daily & weight continuing to rise;  reminded to call for an overnight weight gain of >  2 pounds or a weekly weight gain of > 5 pounds - ReDs 35% but this is her first reading so unclear what her baseline is - Echo 01/15/19: EF 60-65% with mild/ moderate LAE and mild RAE - Echo 01/28/22: EF of 55-60% along with mild LVH and severe LAE. - stop furosemide and begin torsemide 40mg  X 2 days and then decrease to 20mg  daily - continue 20 meq potassium w/ each tablet of diuretic - discussed updating echo but will get her euvolemic first - not adding salt and has been looking at food labels for sodium content; does not eat out - keeping her daily fluid intake to ~65 ounces - BMET/ BNP today - BNP 04/02/22 was 265.8  2: HTN- - BP 127/53 - saw PCP Hyacinth Meeker) 12/24 - BMP 07/02/23 reviewed and showed sodium 135, potassium 4.1, creatinine 0.6 and GFR 88 - BMET today  3: PAF- - saw cardiology (Paraschos) 11/24 - irregular rhythm  - continue apixaban 5mg  BID - continue diltiazem 120mg  daily - continue metoprolol tartrate 50mg  BID  4: COPD/ Pulmonary HTN- - saw pulmonology Karna Christmas) 11/24 - PFT's 05/28/23  Return in 1 week, sooner if needed.   Delma Freeze, FNP 08/26/23

## 2023-08-27 ENCOUNTER — Encounter: Payer: Self-pay | Admitting: Family

## 2023-08-27 ENCOUNTER — Ambulatory Visit (HOSPITAL_BASED_OUTPATIENT_CLINIC_OR_DEPARTMENT_OTHER): Payer: Medicare HMO | Admitting: Family

## 2023-08-27 ENCOUNTER — Other Ambulatory Visit
Admission: RE | Admit: 2023-08-27 | Discharge: 2023-08-27 | Disposition: A | Discharge status: Home / Self Care | Payer: Medicare HMO | Source: Ambulatory Visit | Attending: Family | Admitting: Family

## 2023-08-27 VITALS — BP 127/53 | HR 52 | Wt 174.0 lb

## 2023-08-27 DIAGNOSIS — I110 Hypertensive heart disease with heart failure: Secondary | ICD-10-CM | POA: Diagnosis not present

## 2023-08-27 DIAGNOSIS — I5032 Chronic diastolic (congestive) heart failure: Secondary | ICD-10-CM | POA: Diagnosis present

## 2023-08-27 DIAGNOSIS — I480 Paroxysmal atrial fibrillation: Secondary | ICD-10-CM

## 2023-08-27 DIAGNOSIS — J449 Chronic obstructive pulmonary disease, unspecified: Secondary | ICD-10-CM | POA: Diagnosis not present

## 2023-08-27 DIAGNOSIS — I10 Essential (primary) hypertension: Secondary | ICD-10-CM

## 2023-08-27 LAB — BASIC METABOLIC PANEL
Anion gap: 10 (ref 5–15)
BUN: 19 mg/dL (ref 8–23)
CO2: 24 mmol/L (ref 22–32)
Calcium: 8.7 mg/dL — ABNORMAL LOW (ref 8.9–10.3)
Chloride: 98 mmol/L (ref 98–111)
Creatinine, Ser: 0.75 mg/dL (ref 0.44–1.00)
GFR, Estimated: >60 mL/min (ref >60)
Glucose, Bld: 95 mg/dL (ref 70–99)
Potassium: 4.1 mmol/L (ref 3.5–5.1)
Sodium: 132 mmol/L — ABNORMAL LOW (ref 135–145)

## 2023-08-27 LAB — BRAIN NATRIURETIC PEPTIDE: B Natriuretic Peptide: 299.7 pg/mL — ABNORMAL HIGH (ref 0.0–100.0)

## 2023-08-27 MED ORDER — TORSEMIDE 20 MG PO TABS: ORAL_TABLET | ORAL | 3 refills | Status: DC

## 2023-08-27 NOTE — Patient Instructions (Addendum)
DISCONTINUE LASIX   START TORSEMIDE 40 MG ONCE DAILY (2 TABLETS) FOR 2 DAYS.  THEN, DECREASE TO 20 MG ONCE DAILY   Go over to the [facility]. Go pass the gift shop and have your blood work completed.  We will only call you if the results are abnormal or if the provider would like to make medication changes.

## 2023-08-27 NOTE — Progress Notes (Signed)
REDS VEST READING= 35% CHEST RULER=35 HEIGHT MARKER=A

## 2023-08-28 ENCOUNTER — Encounter: Payer: Self-pay | Admitting: Family

## 2023-09-03 ENCOUNTER — Encounter: Payer: Medicare HMO | Admitting: Family

## 2023-09-03 NOTE — Progress Notes (Signed)
 Advanced Heart Failure Clinic Note    PCP: Cleotilde Oneil FALCON, MD (last seen 12/24) Cardiologist: Ammon Blunt, MD (last seen 11/24)  Chief Complaint: fatigue  HPI:  Cathy Spears is a 88 y/o female with a history of breast cancer, HTN, anxiety, paroxysmal atrial fibrillation, paroxysmal SVT, COPD, GERD and chronic heart failure.   Admitted 01/27/22 due to worsening cough with occasional inability to breathe as well as wheezing. While she was in the ER she was witnessed to have spasms with significant anxiety and difficulty breathing however with normal pulse oximetry. Cardiology & ENT consults obtained. Given IV lasix  and IV decadron . S/p laryngoscope which was normal as per ENT. Speech evaluated pt. Discharged after 2 days. Admitted 02/02/22 due to shortness of breath along with cough and found to be in AF RVR. Cardiology consult obtained. Started on diltiazem  drip and continued on eliquis . Given steroids for COPD exacerbation. CT scan ruled out PE. Discharged after 6 days.   Echo 01/15/19: EF 60-65% with mild/ moderate LAE and mild RAE Echo 01/28/22: EF 55-60% along with mild LVH and severe LAE.   She presents, with her daughter, for a HF follow-up visit with a chief complaint of fatigue She says that this has improved since her last visit. She denies shortness of breath, chest pain, cough, palpitations, abdominal distention, pedal edema, dizziness, difficulty sleeping or weight gain.   At last visit, diuretic was changed from furosemide  to torsemide . Took 2 days of 40mg  dose and is now taking 20mg  daily. She feels like her symptoms have improved greatly since diuretic being changed to torsemide .   ROS: All systems negative except as listed in HPI, PMH and Problem list.    Past Medical History:  Diagnosis Date   Anxiety    Atrial fibrillation (HCC)    Breast cancer (HCC)    Cancer (HCC)    ,bilateral lymph nodes removed  breast mostly from the left   Cataracts, bilateral    CHF  (congestive heart failure) (HCC)    Complication of anesthesia    COPD (chronic obstructive pulmonary disease) (HCC)    Dysrhythmia    hx of AF   Esophageal erosions    GERD (gastroesophageal reflux disease)    Hypertension    dr ammon  at [facility]   Personal history of radiation therapy    PONV (postoperative nausea and vomiting)    spinal anesthesia x1    Current Outpatient Medications  Medication Sig Dispense Refill   apixaban  (ELIQUIS ) 5 MG TABS tablet Take 1 tablet (5 mg total) by mouth 2 (two) times daily. 60 tablet 0   Calcium  Carbonate-Vitamin D  (TH CALCIUM  CARBONATE-VITAMIN D ) 600-400 MG-UNIT tablet Take 1 tablet by mouth 2 (two) times daily.     cholecalciferol  (VITAMIN D ) 1000 UNITS tablet Take 1,000 Units by mouth daily.     diltiazem  (CARDIZEM  CD) 120 MG 24 hr capsule Take 1 capsule (120 mg total) by mouth daily. 30 capsule 0   metoprolol  (LOPRESSOR ) 50 MG tablet Take 50 mg by mouth 2 (two) times daily.     Multiple Vitamins-Minerals (PRESERVISION AREDS PO) Take 1 tablet by mouth 2 (two) times daily.     omeprazole (PRILOSEC) 40 MG capsule Take 40 mg by mouth 2 (two) times a day.      potassium chloride  SA (KLOR-CON  M) 20 MEQ tablet Take 1 tablet (20 mEq total) by mouth every other day. Alternating with 40meq every other day (Patient taking differently: Take 20 mEq by mouth daily.)  50 tablet 3   torsemide  (DEMADEX ) 20 MG tablet TAKE 40 MG FOR 2 DAYS. THEN DECREASE TO 20 MG DAILY. 90 tablet 3   vitamin B-12 (CYANOCOBALAMIN ) 1000 MCG tablet Take 1,000 mcg by mouth daily.     No current facility-administered medications for this visit.    Allergies  Allergen Reactions   Fish-Derived Products Swelling    Blue Fish years ago.  Caused redness over all of face   Oxycodone  Swelling      Social History   Socioeconomic History   Marital status: Married    Spouse name: Not on file   Number of children: Not on file   Years of education: Not on file   Highest  education level: Not on file  Occupational History   Not on file  Tobacco Use   Smoking status: Never   Smokeless tobacco: Never  Vaping Use   Vaping status: Never Used  Substance and Sexual Activity   Alcohol use: No   Drug use: No   Sexual activity: Not on file  Other Topics Concern   Not on file  Social History Narrative   Not on file   Social Drivers of Health   Financial Resource Strain: Low Risk  (07/09/2023)   Received from [facility]   Overall Financial Resource Strain (CARDIA)    Difficulty of Paying Living Expenses: Not hard at all  Food Insecurity: No Food Insecurity (07/09/2023)   Received from [facility]   Hunger Vital Sign    Ran Out of Food in the Last Year: Never true    Worried About Running Out of Food in the Last Year: Never true  Transportation Needs: No Transportation Needs (07/09/2023)   Received from [facility] - Transportation    In the past 12 months, has lack of transportation kept you from medical appointments or from getting medications?: No    Lack of Transportation (Non-Medical): No  Physical Activity: Not on file  Stress: Not on file  Social Connections: Not on file  Intimate Partner Violence: Not on file     No family history on file.  Vitals:   09/04/23 1342  BP: 111/60  Pulse: 66  SpO2: 98%  Weight: 165 lb 9.6 oz (75.1 kg)   Wt Readings from Last 3 Encounters:  09/04/23 165 lb 9.6 oz (75.1 kg)  08/27/23 174 lb (78.9 kg)  05/22/22 176 lb 6 oz (80 kg)   Lab Results  Component Value Date   CREATININE 0.75 08/27/2023   CREATININE 0.60 04/02/2022   CREATININE 0.73 02/07/2022   PHYSICAL EXAM:  General: Well appearing. No resp difficulty HEENT: normal Neck: supple, no JVD Cor: Regular rate irregular rhythm. No rubs, gallops or murmurs Lungs: clear Abdomen: soft, nontender, nondistended. Extremities: no cyanosis, clubbing, rash, trace pitting edema bilateral  lower legs Neuro: alert & oriented X 3. Moves all 4 extremities w/o difficulty. Affect pleasant   ECG: not done  ReDs reading: 31 %, normal   ASSESSMENT & PLAN:  1: NICM with preserved ejection fraction- - suspect due to atrial fibrillation - NYHA class II - euvolemic - weighing daily;  reminded to call for an overnight weight gain of > 2 pounds or a weekly weight gain of > 5 pounds - weight down 9 pounds from last visit here 1 week ago - ReDs: 31% (normal reading) - Echo 01/15/19: EF 60-65% with mild/ moderate LAE and mild RAE - Echo 01/28/22: EF  of 55-60% along with mild LVH and severe LAE. - continue torsemide  20mg  daily - continue 20 meq potassium  - not adding salt and has been looking at food labels for sodium content; does not eat out - keeping her daily fluid intake to ~65 ounces - BMET/ BNP today - BNP 08/27/23 reviewed and was 299.7  2: HTN- - BP 111/60 - saw PCP Ted) 12/24 - BMP 08/27/23 reviewed and showed sodium 132, potassium 4.1, creatinine 0.75 and GFR >60 - BMET today  3: PAF- - saw cardiology (Paraschos) 11/24 - irregular rhythm  - continue apixaban  5mg  BID - continue diltiazem  120mg  daily - continue metoprolol  tartrate 50mg  BID  4: COPD/ Pulmonary HTN- - saw pulmonology Burnie) 11/24 - PFT's 05/28/23  Return in 3 months, sooner if needed.   Ellouise DELENA Class, FNP 09/03/23

## 2023-09-04 ENCOUNTER — Ambulatory Visit: Payer: Medicare HMO | Attending: Family | Admitting: Family

## 2023-09-04 ENCOUNTER — Encounter: Payer: Self-pay | Admitting: Family

## 2023-09-04 VITALS — BP 111/60 | HR 66 | Wt 165.6 lb

## 2023-09-04 DIAGNOSIS — I480 Paroxysmal atrial fibrillation: Secondary | ICD-10-CM | POA: Insufficient documentation

## 2023-09-04 DIAGNOSIS — K219 Gastro-esophageal reflux disease without esophagitis: Secondary | ICD-10-CM | POA: Insufficient documentation

## 2023-09-04 DIAGNOSIS — I110 Hypertensive heart disease with heart failure: Secondary | ICD-10-CM | POA: Diagnosis not present

## 2023-09-04 DIAGNOSIS — I5032 Chronic diastolic (congestive) heart failure: Secondary | ICD-10-CM | POA: Insufficient documentation

## 2023-09-04 DIAGNOSIS — Z853 Personal history of malignant neoplasm of breast: Secondary | ICD-10-CM | POA: Insufficient documentation

## 2023-09-04 DIAGNOSIS — I428 Other cardiomyopathies: Secondary | ICD-10-CM | POA: Diagnosis not present

## 2023-09-04 DIAGNOSIS — Z79899 Other long term (current) drug therapy: Secondary | ICD-10-CM | POA: Diagnosis not present

## 2023-09-04 DIAGNOSIS — I10 Essential (primary) hypertension: Secondary | ICD-10-CM

## 2023-09-04 DIAGNOSIS — I272 Pulmonary hypertension, unspecified: Secondary | ICD-10-CM | POA: Diagnosis not present

## 2023-09-04 DIAGNOSIS — Z7901 Long term (current) use of anticoagulants: Secondary | ICD-10-CM | POA: Diagnosis not present

## 2023-09-04 DIAGNOSIS — J449 Chronic obstructive pulmonary disease, unspecified: Secondary | ICD-10-CM | POA: Diagnosis not present

## 2023-09-04 DIAGNOSIS — F419 Anxiety disorder, unspecified: Secondary | ICD-10-CM | POA: Diagnosis not present

## 2023-09-04 NOTE — Progress Notes (Signed)
 ReDS Vest / Clip - 09/04/23 1422       ReDS Vest / Clip   Station Marker A    Ruler Value 28    ReDS Value Range Low volume    ReDS Actual Value 31

## 2023-09-04 NOTE — Patient Instructions (Addendum)
 Go DOWN to LOWER LEVEL (LL) to have your blood work completed inside of [employer] office.  We will only call you if the results are abnormal or if the provider would like to make medication changes.   It was great seeing you today! I will send you a mychart message after I get your lab results back.

## 2023-09-05 LAB — BASIC METABOLIC PANEL
BUN/Creatinine Ratio: 16 (ref 12–28)
BUN: 16 mg/dL (ref 8–27)
CO2: 26 mmol/L (ref 20–29)
Calcium: 9.5 mg/dL (ref 8.7–10.3)
Chloride: 91 mmol/L — ABNORMAL LOW (ref 96–106)
Creatinine, Ser: 1.02 mg/dL — ABNORMAL HIGH (ref 0.57–1.00)
Glucose: 100 mg/dL — ABNORMAL HIGH (ref 70–99)
Potassium: 4.1 mmol/L (ref 3.5–5.2)
Sodium: 135 mmol/L (ref 134–144)
eGFR: 54 mL/min/{1.73_m2} — ABNORMAL LOW (ref >59)

## 2023-09-05 LAB — BRAIN NATRIURETIC PEPTIDE: BNP: 150.6 pg/mL — ABNORMAL HIGH (ref 0.0–100.0)

## 2023-09-07 ENCOUNTER — Encounter: Payer: Self-pay | Admitting: Family

## 2023-09-09 ENCOUNTER — Other Ambulatory Visit: Payer: Self-pay | Admitting: Family

## 2023-09-09 DIAGNOSIS — I5032 Chronic diastolic (congestive) heart failure: Secondary | ICD-10-CM

## 2023-09-22 ENCOUNTER — Other Ambulatory Visit
Admission: RE | Admit: 2023-09-22 | Discharge: 2023-09-22 | Disposition: A | Discharge status: Home / Self Care | Payer: Medicare HMO | Attending: Family | Admitting: Family

## 2023-09-22 ENCOUNTER — Encounter: Payer: Self-pay | Admitting: Family

## 2023-09-22 DIAGNOSIS — I5032 Chronic diastolic (congestive) heart failure: Secondary | ICD-10-CM | POA: Diagnosis present

## 2023-09-22 LAB — BASIC METABOLIC PANEL
Anion gap: 11 (ref 5–15)
BUN: 21 mg/dL (ref 8–23)
CO2: 28 mmol/L (ref 22–32)
Calcium: 9.3 mg/dL (ref 8.9–10.3)
Chloride: 95 mmol/L — ABNORMAL LOW (ref 98–111)
Creatinine, Ser: 0.7 mg/dL (ref 0.44–1.00)
GFR, Estimated: >60 mL/min (ref >60)
Glucose, Bld: 89 mg/dL (ref 70–99)
Potassium: 3.7 mmol/L (ref 3.5–5.1)
Sodium: 134 mmol/L — ABNORMAL LOW (ref 135–145)

## 2023-12-03 ENCOUNTER — Encounter: Payer: Medicare HMO | Admitting: Family

## 2023-12-04 ENCOUNTER — Encounter: Payer: Medicare HMO | Admitting: Family

## 2024-01-20 ENCOUNTER — Other Ambulatory Visit: Payer: Self-pay

## 2024-01-20 ENCOUNTER — Emergency Department

## 2024-01-20 DIAGNOSIS — M7981 Nontraumatic hematoma of soft tissue: Secondary | ICD-10-CM | POA: Diagnosis not present

## 2024-01-20 DIAGNOSIS — Z7901 Long term (current) use of anticoagulants: Secondary | ICD-10-CM | POA: Diagnosis not present

## 2024-01-20 DIAGNOSIS — I509 Heart failure, unspecified: Secondary | ICD-10-CM | POA: Diagnosis not present

## 2024-01-20 DIAGNOSIS — M7989 Other specified soft tissue disorders: Secondary | ICD-10-CM | POA: Insufficient documentation

## 2024-01-20 LAB — BASIC METABOLIC PANEL WITH GFR
Anion gap: 12 (ref 5–15)
BUN: 26 mg/dL — ABNORMAL HIGH (ref 8–23)
CO2: 26 mmol/L (ref 22–32)
Calcium: 8.8 mg/dL — ABNORMAL LOW (ref 8.9–10.3)
Chloride: 98 mmol/L (ref 98–111)
Creatinine, Ser: 0.92 mg/dL (ref 0.44–1.00)
GFR, Estimated: >60 mL/min (ref >60)
Glucose, Bld: 117 mg/dL — ABNORMAL HIGH (ref 70–99)
Potassium: 3.4 mmol/L — ABNORMAL LOW (ref 3.5–5.1)
Sodium: 136 mmol/L (ref 135–145)

## 2024-01-20 LAB — CBC WITH DIFFERENTIAL/PLATELET
Abs Immature Granulocytes: 0.02 10*3/uL (ref 0.00–0.07)
Basophils Absolute: 0 10*3/uL (ref 0.0–0.1)
Basophils Relative: 0 %
Eosinophils Absolute: 0.1 10*3/uL (ref 0.0–0.5)
Eosinophils Relative: 1 %
HCT: 33.7 % — ABNORMAL LOW (ref 36.0–46.0)
Hemoglobin: 11.1 g/dL — ABNORMAL LOW (ref 12.0–15.0)
Immature Granulocytes: 0 %
Lymphocytes Relative: 21 %
Lymphs Abs: 1.4 10*3/uL (ref 0.7–4.0)
MCH: 27.3 pg (ref 26.0–34.0)
MCHC: 32.9 g/dL (ref 30.0–36.0)
MCV: 82.8 fL (ref 80.0–100.0)
Monocytes Absolute: 0.6 10*3/uL (ref 0.1–1.0)
Monocytes Relative: 9 %
Neutro Abs: 4.6 10*3/uL (ref 1.7–7.7)
Neutrophils Relative %: 69 %
Platelets: 202 10*3/uL (ref 150–400)
RBC: 4.07 MIL/uL (ref 3.87–5.11)
RDW: 15.6 % — ABNORMAL HIGH (ref 11.5–15.5)
WBC: 6.8 10*3/uL (ref 4.0–10.5)
nRBC: 0 % (ref 0.0–0.2)

## 2024-01-20 LAB — BRAIN NATRIURETIC PEPTIDE: B Natriuretic Peptide: 208.1 pg/mL — ABNORMAL HIGH (ref 0.0–100.0)

## 2024-01-20 LAB — D-DIMER, QUANTITATIVE: D-Dimer, Quant: 0.31 ug{FEU}/mL (ref 0.00–0.50)

## 2024-01-20 NOTE — ED Triage Notes (Signed)
 Pt reports right foot swelling and bruising that began 2 days ago, pt denies injury. Pt is ambulatory. Pt has hx chf, denies sob. Pt is taking eliquis .

## 2024-01-21 ENCOUNTER — Emergency Department

## 2024-01-21 ENCOUNTER — Emergency Department
Admission: EM | Admit: 2024-01-21 | Discharge: 2024-01-21 | Disposition: A | Discharge status: Home / Self Care | Attending: Emergency Medicine | Admitting: Emergency Medicine

## 2024-01-21 DIAGNOSIS — R2241 Localized swelling, mass and lump, right lower limb: Secondary | ICD-10-CM

## 2024-01-21 NOTE — ED Provider Notes (Signed)
 [facility] Provider Note    Event Date/Time   First MD Initiated Contact with Patient 01/21/24 0022     (approximate)   History   Foot Swelling Daughter at bedside declined interpreter and will interpret for her mother.  HPI Cathy Spears is a 88 y.o. female with history of CHF presenting today for foot swelling.  Patient reportedly had onset of swelling and bruising to her right foot.  This was noted 2 days ago.  She has been able to ambulate on it without significant pain.  No significant worsening of the swelling or bruising.  Sensation intact throughout.  She does not remember any obvious trauma but daughter states that she is sometimes forgetful.  Denies any other leg pain or leg swelling anywhere.  On Eliquis  with no missed doses.     Physical Exam   Triage Vital Signs: ED Triage Vitals  Encounter Vitals Group     BP 01/20/24 2004 126/82     Girls Systolic BP Percentile --      Girls Diastolic BP Percentile --      Boys Systolic BP Percentile --      Boys Diastolic BP Percentile --      Pulse Rate 01/20/24 2004 75     Resp 01/20/24 2004 18     Temp 01/20/24 2004 98.3 F (36.8 C)     Temp Source 01/20/24 2004 Oral     SpO2 01/20/24 2004 95 %     Weight 01/20/24 2003 171 lb (77.6 kg)     Height 01/20/24 2003 5' 3 (1.6 m)     Head Circumference --      Peak Flow --      Pain Score 01/20/24 2003 0     Pain Loc --      Pain Education --      Exclude from Growth Chart --     Most recent vital signs: Vitals:   01/20/24 2004  BP: 126/82  Pulse: 75  Resp: 18  Temp: 98.3 F (36.8 C)  SpO2: 95%   I have reviewed the vital signs. General:  Awake, alert, no acute distress. Head:  Normocephalic, Atraumatic. EENT:  PERRL, EOMI, Oral mucosa pink and moist, Neck is supple. Cardiovascular: Regular rate, 2+ distal pulses. Respiratory:  Normal respiratory effort, symmetrical expansion, no distress.   Extremities:  Moving all four  extremities through full ROM without pain.   Neuro:  Alert and oriented.  Interacting appropriately.   Skin: Mild edema and bruising to the dorsal distal right foot.  2+ DP pulse in the foot.  No significant pain to palpation.  No swelling elsewise to right lower extremity. Psych: Appropriate affect.    ED Results / Procedures / Treatments   Labs (all labs ordered are listed, but only abnormal results are displayed) Labs Reviewed  CBC WITH DIFFERENTIAL/PLATELET - Abnormal; Notable for the following components:      Result Value   Hemoglobin 11.1 (*)    HCT 33.7 (*)    RDW 15.6 (*)    All other components within normal limits  BASIC METABOLIC PANEL WITH GFR - Abnormal; Notable for the following components:   Potassium 3.4 (*)    Glucose, Bld 117 (*)    BUN 26 (*)    Calcium  8.8 (*)    All other components within normal limits  BRAIN NATRIURETIC PEPTIDE - Abnormal; Notable for the following components:   B Natriuretic Peptide 208.1 (*)  All other components within normal limits  D-DIMER, QUANTITATIVE     EKG    RADIOLOGY Independently interpreted ultrasound of right lower extremity with no evidence of DVT.  X-ray shows no evidence of fracture.   PROCEDURES:  Critical Care performed: No  Procedures   MEDICATIONS ORDERED IN ED: Medications - No data to display   IMPRESSION / MDM / ASSESSMENT AND PLAN / ED COURSE  I reviewed the triage vital signs and the nursing notes.                              Differential diagnosis includes, but is not limited to, soft tissue trauma and bruising, lower concern for fracture or DVT  Patient's presentation is most consistent with acute complicated illness / injury requiring diagnostic workup.  Patient is an 88 year old female presenting today for bruising to the dorsal surface of her right foot.  Is on Eliquis  with no known history of trauma within the past couple of days.  Neurovascularly intact throughout the right foot.   No significant pain with palpation or any range of motion.  They do not remember any obvious trauma but daughter states she is sometimes forgetful.  Will get x-ray for further evaluation.  Separately, while she was in the waiting room triage labs were ordered which shows largely unremarkable CBC, BMP, and BNP.  D-dimer was negative and ultrasound ordered in triage of the right lower extremity shows no evidence of DVT.  X-ray shows no evidence of fracture.  Suspect likely just bruising given that she is on Eliquis  but otherwise neurovascular intact.  Recommended compression, ice, and elevation at home and follow-up with PCP as needed.     FINAL CLINICAL IMPRESSION(S) / ED DIAGNOSES   Final diagnoses:  Localized swelling of right foot     Rx / DC Orders   ED Discharge Orders     None        Note:  This document was prepared using Dragon voice recognition software and may include unintentional dictation errors.   [name], MD 01/21/24 [phone]

## 2024-01-21 NOTE — Discharge Instructions (Signed)
 No evidence of blood clot or any broken bones.  Suspect it may just be bruising to the area.  You can use ice to the affected area and elevate the leg when she is at rest.  Follow-up with your primary care provider for reassessment as needed.

## 2024-03-23 ENCOUNTER — Other Ambulatory Visit: Payer: Self-pay | Admitting: Internal Medicine

## 2024-03-23 DIAGNOSIS — Z1231 Encounter for screening mammogram for malignant neoplasm of breast: Secondary | ICD-10-CM

## 2024-04-29 ENCOUNTER — Ambulatory Visit
Admission: RE | Admit: 2024-04-29 | Discharge: 2024-04-29 | Disposition: A | Source: Ambulatory Visit | Attending: Internal Medicine | Admitting: Internal Medicine

## 2024-04-29 DIAGNOSIS — Z1231 Encounter for screening mammogram for malignant neoplasm of breast: Secondary | ICD-10-CM

## 2024-08-26 ENCOUNTER — Other Ambulatory Visit: Payer: Self-pay | Admitting: Internal Medicine

## 2024-08-26 DIAGNOSIS — S32020G Wedge compression fracture of second lumbar vertebra, subsequent encounter for fracture with delayed healing: Secondary | ICD-10-CM

## 2024-08-30 ENCOUNTER — Ambulatory Visit
Admission: RE | Admit: 2024-08-30 | Discharge: 2024-08-30 | Disposition: A | Source: Ambulatory Visit | Attending: Internal Medicine | Admitting: Internal Medicine

## 2024-08-30 DIAGNOSIS — S32020G Wedge compression fracture of second lumbar vertebra, subsequent encounter for fracture with delayed healing: Secondary | ICD-10-CM

## 2024-09-20 ENCOUNTER — Ambulatory Visit: Admitting: Urology
